# Patient Record
Sex: Female | Born: 1980 | Race: White | Hispanic: No | Marital: Married | Smoking: Current some day smoker
Health system: Southern US, Community
[De-identification: ages and names within clinical notes are randomized; demographics above are authoritative.]

## PROBLEM LIST (undated history)

## (undated) ENCOUNTER — Inpatient Hospital Stay (HOSPITAL_COMMUNITY): Payer: Self-pay

## (undated) DIAGNOSIS — I10 Essential (primary) hypertension: Secondary | ICD-10-CM

## (undated) DIAGNOSIS — R519 Headache, unspecified: Secondary | ICD-10-CM

## (undated) DIAGNOSIS — F32A Depression, unspecified: Secondary | ICD-10-CM

## (undated) DIAGNOSIS — E785 Hyperlipidemia, unspecified: Secondary | ICD-10-CM

## (undated) DIAGNOSIS — E079 Disorder of thyroid, unspecified: Secondary | ICD-10-CM

## (undated) DIAGNOSIS — E039 Hypothyroidism, unspecified: Secondary | ICD-10-CM

## (undated) DIAGNOSIS — F329 Major depressive disorder, single episode, unspecified: Secondary | ICD-10-CM

## (undated) DIAGNOSIS — D649 Anemia, unspecified: Secondary | ICD-10-CM

## (undated) DIAGNOSIS — F419 Anxiety disorder, unspecified: Secondary | ICD-10-CM

## (undated) DIAGNOSIS — O24419 Gestational diabetes mellitus in pregnancy, unspecified control: Secondary | ICD-10-CM

## (undated) DIAGNOSIS — I2699 Other pulmonary embolism without acute cor pulmonale: Secondary | ICD-10-CM

## (undated) HISTORY — DX: Disorder of thyroid, unspecified: E07.9

## (undated) HISTORY — PX: EYE SURGERY: SHX253

## (undated) HISTORY — DX: Hyperlipidemia, unspecified: E78.5

## (undated) HISTORY — DX: Anxiety disorder, unspecified: F41.9

## (undated) HISTORY — PX: WISDOM TOOTH EXTRACTION: SHX21

## (undated) HISTORY — DX: Headache, unspecified: R51.9

## (undated) HISTORY — DX: Hypercalcemia: E83.52

## (undated) HISTORY — DX: Hypothyroidism, unspecified: E03.9

## (undated) HISTORY — DX: Major depressive disorder, single episode, unspecified: F32.9

## (undated) HISTORY — DX: Anemia, unspecified: D64.9

## (undated) HISTORY — DX: Gestational diabetes mellitus in pregnancy, unspecified control: O24.419

## (undated) HISTORY — DX: Depression, unspecified: F32.A

## (undated) HISTORY — DX: Essential (primary) hypertension: I10

---

## 2002-08-03 DIAGNOSIS — I2699 Other pulmonary embolism without acute cor pulmonale: Secondary | ICD-10-CM

## 2002-08-03 HISTORY — DX: Other pulmonary embolism without acute cor pulmonale: I26.99

## 2007-12-20 ENCOUNTER — Other Ambulatory Visit: Admission: RE | Admit: 2007-12-20 | Discharge: 2007-12-20 | Payer: Self-pay | Admitting: Internal Medicine

## 2008-06-14 ENCOUNTER — Ambulatory Visit: Payer: Self-pay | Admitting: Internal Medicine

## 2008-08-15 ENCOUNTER — Ambulatory Visit: Payer: Self-pay | Admitting: Oncology

## 2008-08-21 LAB — CBC WITH DIFFERENTIAL/PLATELET
Basophils Absolute: 0 10*3/uL (ref 0.0–0.1)
EOS%: 1.6 % (ref 0.0–7.0)
HGB: 11.9 g/dL (ref 11.6–15.9)
MCH: 30.6 pg (ref 26.0–34.0)
NEUT#: 7.3 10*3/uL — ABNORMAL HIGH (ref 1.5–6.5)
RBC: 3.9 10*6/uL (ref 3.70–5.32)
RDW: 12.6 % (ref 11.3–14.5)
lymph#: 1.7 10*3/uL (ref 0.9–3.3)

## 2008-08-27 LAB — HYPERCOAGULABLE PANEL, COMPREHENSIVE RET.
Beta-2 Glyco I IgG: <4 U/mL (ref <20)
Beta-2-Glycoprotein I IgM: <4 U/mL (ref <10)
DRVVT 1:1 Mix: 40.5 secs (ref 36.1–47.0)
Homocysteine: 7 umol/L (ref 4.0–15.4)
Protein C Activity: 187 % — ABNORMAL HIGH (ref 75–133)
Protein C, Total: 119 % (ref 70–140)
Protein S Activity: 42 % — ABNORMAL LOW (ref 69–129)
Protein S Ag, Total: 88 % (ref 70–140)

## 2008-08-27 LAB — COMPREHENSIVE METABOLIC PANEL
ALT: 15 U/L (ref 0–35)
AST: 14 U/L (ref 0–37)
Albumin: 4.1 g/dL (ref 3.5–5.2)
BUN: 12 mg/dL (ref 6–23)
Calcium: 11.2 mg/dL — ABNORMAL HIGH (ref 8.4–10.5)
Chloride: 104 mEq/L (ref 96–112)
Potassium: 4.1 mEq/L (ref 3.5–5.3)
Sodium: 135 mEq/L (ref 135–145)
Total Protein: 7.3 g/dL (ref 6.0–8.3)

## 2009-01-09 ENCOUNTER — Encounter: Admission: RE | Admit: 2009-01-09 | Discharge: 2009-01-09 | Payer: Self-pay | Admitting: Obstetrics and Gynecology

## 2009-03-12 ENCOUNTER — Inpatient Hospital Stay (HOSPITAL_COMMUNITY): Admission: AD | Admit: 2009-03-12 | Discharge: 2009-03-14 | Payer: Self-pay | Admitting: Obstetrics and Gynecology

## 2009-09-09 ENCOUNTER — Ambulatory Visit: Payer: Self-pay | Admitting: Internal Medicine

## 2009-09-24 ENCOUNTER — Ambulatory Visit: Payer: Self-pay | Admitting: Internal Medicine

## 2010-03-17 ENCOUNTER — Ambulatory Visit: Payer: Self-pay | Admitting: Internal Medicine

## 2010-04-17 ENCOUNTER — Ambulatory Visit: Payer: Self-pay | Admitting: Internal Medicine

## 2010-05-09 ENCOUNTER — Ambulatory Visit: Payer: Self-pay | Admitting: Internal Medicine

## 2010-06-20 ENCOUNTER — Ambulatory Visit: Payer: Self-pay | Admitting: Internal Medicine

## 2010-10-10 ENCOUNTER — Ambulatory Visit: Payer: Self-pay | Admitting: Internal Medicine

## 2010-10-14 ENCOUNTER — Ambulatory Visit (INDEPENDENT_AMBULATORY_CARE_PROVIDER_SITE_OTHER): Payer: BC Managed Care – PPO | Admitting: Internal Medicine

## 2010-10-14 DIAGNOSIS — I10 Essential (primary) hypertension: Secondary | ICD-10-CM

## 2010-10-14 DIAGNOSIS — E039 Hypothyroidism, unspecified: Secondary | ICD-10-CM

## 2010-10-14 DIAGNOSIS — H6690 Otitis media, unspecified, unspecified ear: Secondary | ICD-10-CM

## 2010-11-08 LAB — CBC
HCT: 29.6 % — ABNORMAL LOW (ref 36.0–46.0)
HCT: 32.1 % — ABNORMAL LOW (ref 36.0–46.0)
HCT: 34.5 % — ABNORMAL LOW (ref 36.0–46.0)
HCT: 35.2 % — ABNORMAL LOW (ref 36.0–46.0)
Hemoglobin: 11 g/dL — ABNORMAL LOW (ref 12.0–15.0)
Hemoglobin: 11.8 g/dL — ABNORMAL LOW (ref 12.0–15.0)
Hemoglobin: 12.2 g/dL (ref 12.0–15.0)
MCHC: 34.2 g/dL (ref 30.0–36.0)
MCHC: 34.4 g/dL (ref 30.0–36.0)
MCHC: 34.5 g/dL (ref 30.0–36.0)
MCV: 90.8 fL (ref 78.0–100.0)
MCV: 91.7 fL (ref 78.0–100.0)
MCV: 91.8 fL (ref 78.0–100.0)
MCV: 91.8 fL (ref 78.0–100.0)
Platelets: 146 10*3/uL — ABNORMAL LOW (ref 150–400)
Platelets: 152 10*3/uL (ref 150–400)
Platelets: 158 10*3/uL (ref 150–400)
RBC: 3.49 MIL/uL — ABNORMAL LOW (ref 3.87–5.11)
RBC: 3.8 MIL/uL — ABNORMAL LOW (ref 3.87–5.11)
RBC: 3.84 MIL/uL — ABNORMAL LOW (ref 3.87–5.11)
RDW: 13.4 % (ref 11.5–15.5)
RDW: 13.5 % (ref 11.5–15.5)
RDW: 13.6 % (ref 11.5–15.5)
WBC: 10.3 10*3/uL (ref 4.0–10.5)
WBC: 11.9 10*3/uL — ABNORMAL HIGH (ref 4.0–10.5)
WBC: 12.2 10*3/uL — ABNORMAL HIGH (ref 4.0–10.5)

## 2010-11-08 LAB — COMPREHENSIVE METABOLIC PANEL
Albumin: 2.1 g/dL — ABNORMAL LOW (ref 3.5–5.2)
Albumin: 2.7 g/dL — ABNORMAL LOW (ref 3.5–5.2)
Alkaline Phosphatase: 114 U/L (ref 39–117)
BUN: 10 mg/dL (ref 6–23)
BUN: 14 mg/dL (ref 6–23)
Chloride: 110 mEq/L (ref 96–112)
Creatinine, Ser: 0.97 mg/dL (ref 0.4–1.2)
Potassium: 4.3 mEq/L (ref 3.5–5.1)
Total Bilirubin: 0.3 mg/dL (ref 0.3–1.2)
Total Protein: 4.7 g/dL — ABNORMAL LOW (ref 6.0–8.3)

## 2010-11-08 LAB — URIC ACID: Uric Acid, Serum: 5.4 mg/dL (ref 2.4–7.0)

## 2010-11-08 LAB — LACTATE DEHYDROGENASE: LDH: 138 U/L (ref 94–250)

## 2010-11-08 LAB — GLUCOSE, CAPILLARY
Glucose-Capillary: 105 mg/dL — ABNORMAL HIGH (ref 70–99)
Glucose-Capillary: 76 mg/dL (ref 70–99)
Glucose-Capillary: 79 mg/dL (ref 70–99)

## 2010-11-08 LAB — SYPHILIS: RPR W/REFLEX TO RPR TITER AND TREPONEMAL ANTIBODIES, TRADITIONAL SCREENING AND DIAGNOSIS ALGORITHM: RPR Ser Ql: NONREACTIVE

## 2010-11-08 LAB — CCBB MATERNAL DONOR DRAW

## 2011-04-15 ENCOUNTER — Encounter: Payer: Self-pay | Admitting: Internal Medicine

## 2011-04-16 ENCOUNTER — Other Ambulatory Visit: Payer: BC Managed Care – PPO | Admitting: Internal Medicine

## 2011-04-16 DIAGNOSIS — Z0000 Encounter for general adult medical examination without abnormal findings: Secondary | ICD-10-CM

## 2011-04-16 LAB — CBC WITH DIFFERENTIAL/PLATELET
Basophils Absolute: 0 10*3/uL (ref 0.0–0.1)
Basophils Relative: 0 % (ref 0–1)
Lymphocytes Relative: 33 % (ref 12–46)
MCHC: 32.2 g/dL (ref 30.0–36.0)
Monocytes Absolute: 0.5 10*3/uL (ref 0.1–1.0)
Neutro Abs: 4.1 10*3/uL (ref 1.7–7.7)
Platelets: 253 10*3/uL (ref 150–400)
RDW: 13 % (ref 11.5–15.5)
WBC: 7.1 10*3/uL (ref 4.0–10.5)

## 2011-04-16 LAB — COMPREHENSIVE METABOLIC PANEL
AST: 16 U/L (ref 0–37)
Albumin: 4.2 g/dL (ref 3.5–5.2)
Alkaline Phosphatase: 64 U/L (ref 39–117)
BUN: 8 mg/dL (ref 6–23)
Creat: 0.92 mg/dL (ref 0.50–1.10)
Potassium: 4.7 mEq/L (ref 3.5–5.3)

## 2011-04-16 LAB — TSH: TSH: 2.671 u[IU]/mL (ref 0.350–4.500)

## 2011-04-16 LAB — LIPID PANEL
Total CHOL/HDL Ratio: 4.6 Ratio
Triglycerides: 159 mg/dL — ABNORMAL HIGH (ref <150)
VLDL: 32 mg/dL (ref 0–40)

## 2011-04-17 ENCOUNTER — Encounter: Payer: Self-pay | Admitting: Internal Medicine

## 2011-04-17 ENCOUNTER — Ambulatory Visit (INDEPENDENT_AMBULATORY_CARE_PROVIDER_SITE_OTHER): Payer: BC Managed Care – PPO | Admitting: Internal Medicine

## 2011-04-17 VITALS — BP 132/80 | HR 80 | Temp 98.9°F | Ht 66.75 in | Wt 208.0 lb

## 2011-04-17 DIAGNOSIS — F419 Anxiety disorder, unspecified: Secondary | ICD-10-CM

## 2011-04-17 DIAGNOSIS — IMO0002 Reserved for concepts with insufficient information to code with codable children: Secondary | ICD-10-CM

## 2011-04-17 DIAGNOSIS — E785 Hyperlipidemia, unspecified: Secondary | ICD-10-CM | POA: Insufficient documentation

## 2011-04-17 DIAGNOSIS — Z0000 Encounter for general adult medical examination without abnormal findings: Secondary | ICD-10-CM

## 2011-04-17 DIAGNOSIS — F411 Generalized anxiety disorder: Secondary | ICD-10-CM

## 2011-04-17 DIAGNOSIS — E669 Obesity, unspecified: Secondary | ICD-10-CM

## 2011-04-17 DIAGNOSIS — E039 Hypothyroidism, unspecified: Secondary | ICD-10-CM

## 2011-04-17 DIAGNOSIS — E559 Vitamin D deficiency, unspecified: Secondary | ICD-10-CM

## 2011-04-17 DIAGNOSIS — F53 Postpartum depression: Secondary | ICD-10-CM

## 2011-04-17 DIAGNOSIS — I10 Essential (primary) hypertension: Secondary | ICD-10-CM

## 2011-04-17 LAB — POCT URINALYSIS DIPSTICK
Protein, UA: NEGATIVE
Spec Grav, UA: 1.015
Urobilinogen, UA: NEGATIVE

## 2011-04-17 LAB — VITAMIN D 25 HYDROXY (VIT D DEFICIENCY, FRACTURES): Vit D, 25-Hydroxy: 33 ng/mL (ref 30–89)

## 2011-04-28 ENCOUNTER — Encounter: Payer: Self-pay | Admitting: Internal Medicine

## 2011-04-28 DIAGNOSIS — E559 Vitamin D deficiency, unspecified: Secondary | ICD-10-CM | POA: Insufficient documentation

## 2011-04-28 DIAGNOSIS — F419 Anxiety disorder, unspecified: Secondary | ICD-10-CM | POA: Insufficient documentation

## 2011-04-28 DIAGNOSIS — O99345 Other mental disorders complicating the puerperium: Secondary | ICD-10-CM | POA: Insufficient documentation

## 2011-04-28 NOTE — Progress Notes (Signed)
  Subjective:    Patient ID: Jennifer Mcmahon, female    DOB: [birth year], 30 y.o.   MRN: [id number]  HPI 30 year old white female with history of postpartum depression, pregnancy-induced hypertension and gestational diabetes. She gained 32 pounds during her pregnancy. When I saw her February 2011 blood pressure was 140/96. History of pulmonary embolism 2006 in the spot oral contraceptives, smoking, immobility and obesity. Was on Coumadin for 9 months. Hematologist did hypercoagulable workup. Factor V Leiden mutation not detected. Prothrombin gene mutation not detected. Normal antithrombin III level. No significant problems with protein C or protein S. Started on HCTZ 25 mg daily February 2011 and discontinued March 2012. History of hypothyroidism hypertension hyperlipidemia and vitamin D deficiency, hypercalcemia with normal parathyroid hormone 62.11 April 2010. Currently on Cozaar 50 mg daily. Started on Wellbutrin in 2011 but this has been discontinued. Takes Klonopin 0.5 mg on a when necessary basis. Take Synthroid 0.1 mg daily.  Family history father with history of arthritis, mother with history of ovarian and cervical cancer, hypertension, obesity, varicose veins. Sister with history of TB exposure. History of bipolar disorder and fleabite as in her grandfather. Paternal grandmother had coronary artery disease.    Review of Systems  Constitutional: Negative.   HENT: Negative.   Eyes: Negative.   Cardiovascular: Negative.   Gastrointestinal: Negative.   Genitourinary: Negative.   Musculoskeletal: Negative.   Neurological: Negative.   Hematological: Negative.   Psychiatric/Behavioral: Negative.        Objective:   Physical Exam  Vitals reviewed. Constitutional: She is oriented to person, place, and time. No distress.  HENT:  Head: Normocephalic and atraumatic.  Right Ear: External ear normal.  Left Ear: External ear normal.  Mouth/Throat: Oropharynx is clear and moist. No  oropharyngeal exudate.  Eyes: EOM are normal. No scleral icterus.  Neck: Neck supple. No JVD present. No thyromegaly present.  Cardiovascular: Normal heart sounds and intact distal pulses.   No murmur heard. Pulmonary/Chest: Effort normal and breath sounds normal. She has no wheezes. She has no rales.  Abdominal: Soft. Bowel sounds are normal. She exhibits no distension and no mass. There is no tenderness. There is no rebound and no guarding.  Genitourinary:       Deferred to GYN  Musculoskeletal: She exhibits no edema.  Lymphadenopathy:    She has no cervical adenopathy.  Neurological: She is alert and oriented to person, place, and time. She has normal reflexes. No cranial nerve deficit. Coordination normal.  Skin: Skin is warm and dry. No rash noted. She is not diaphoretic.  Psychiatric: She has a normal mood and affect.          Assessment & Plan:  Hypertension  Hypothyroidism  Hyperlipidemia  Vitamin D deficiency  Depression (postpartum )  History of hypercalcemia with normal parathyroid hormone level (fasting calcium is 9.8)  Return in 6 months

## 2011-10-16 ENCOUNTER — Other Ambulatory Visit: Payer: BC Managed Care – PPO | Admitting: Internal Medicine

## 2011-10-16 ENCOUNTER — Other Ambulatory Visit: Payer: Self-pay | Admitting: Internal Medicine

## 2011-10-16 DIAGNOSIS — R7301 Impaired fasting glucose: Secondary | ICD-10-CM

## 2011-10-16 DIAGNOSIS — E785 Hyperlipidemia, unspecified: Secondary | ICD-10-CM

## 2011-10-16 DIAGNOSIS — E039 Hypothyroidism, unspecified: Secondary | ICD-10-CM

## 2011-10-16 LAB — LIPID PANEL
HDL: 51 mg/dL (ref >39)
LDL Cholesterol: 161 mg/dL — ABNORMAL HIGH (ref 0–99)
Triglycerides: 95 mg/dL (ref <150)
VLDL: 19 mg/dL (ref 0–40)

## 2011-10-16 LAB — TSH: TSH: 3.922 u[IU]/mL (ref 0.350–4.500)

## 2011-10-19 ENCOUNTER — Ambulatory Visit (INDEPENDENT_AMBULATORY_CARE_PROVIDER_SITE_OTHER): Payer: BC Managed Care – PPO | Admitting: Internal Medicine

## 2011-10-19 ENCOUNTER — Encounter: Payer: Self-pay | Admitting: Internal Medicine

## 2011-10-19 VITALS — BP 124/84 | HR 80 | Temp 97.6°F | Wt 195.0 lb

## 2011-10-19 DIAGNOSIS — M545 Low back pain: Secondary | ICD-10-CM

## 2011-10-19 DIAGNOSIS — I10 Essential (primary) hypertension: Secondary | ICD-10-CM

## 2011-10-19 DIAGNOSIS — F53 Postpartum depression: Secondary | ICD-10-CM

## 2011-10-19 DIAGNOSIS — F419 Anxiety disorder, unspecified: Secondary | ICD-10-CM

## 2011-10-19 DIAGNOSIS — IMO0002 Reserved for concepts with insufficient information to code with codable children: Secondary | ICD-10-CM

## 2011-10-19 DIAGNOSIS — F411 Generalized anxiety disorder: Secondary | ICD-10-CM

## 2011-10-19 DIAGNOSIS — E785 Hyperlipidemia, unspecified: Secondary | ICD-10-CM

## 2011-10-19 DIAGNOSIS — E039 Hypothyroidism, unspecified: Secondary | ICD-10-CM

## 2011-10-19 NOTE — Patient Instructions (Addendum)
Continue anti-inflammatory medication as needed for back pain. If not better in 2-3 weeks consider physical therapy. Return in 6 months for physical exam. Return in 8 weeks for TSH on increased dose of Synthroid 0.112 mg daily.

## 2011-10-19 NOTE — Progress Notes (Signed)
  Subjective:    Patient ID: Jennifer Mcmahon, female    DOB: Mar 02, 1981, 31 y.o.   MRN: [id number]  HPI  patient with history of hypothyroidism, hypertension, hyperlipidemia, vitamin D deficiency, depression and hypercalcemia. Used to take Pristiq but now just has Klonopin on hand to take if needed for anxiety. Has been tired lately. We'll vitamin D level checked. TSH is slightly elevated on Synthroid 0.1 mg daily. Ideally I would like to see TSH of 1. We have been increased Synthroid 0.112 mg daily and recheck 8 weeks. Says she's not really taken any Klonopin in 6 months. She hasn't needed it. Blood pressures under good control on Cozaar. No longer takes HCTZ.  In September 2011 parathyroid hormone level was 62.9 which fell within normal limits. Calcium was 10.4 at that time which was normal. Patient was told to return in 6 months. We last saw her March 2012. At that time TSH was 2.756 and calcium was 10.6. We have added vitamin D level and serum calcium to today's labs. These are pending.  Patient has a new problem today which is low back pain. Says she was sitting on the floor cross leg in and felt acute mid lower back pain. Now having some pain in left buttock. Onset of pain was last week. Tried taking some over-the-counter anti-inflammatory medication. This is helped some. However still having some issues with left buttock pain.    Review of Systems     Objective:   Physical Exam straight leg raising is positive bilaterally at 90. Muscle strength in the lower extremities is normal. Deep tendon reflexes 2+ and symmetrical. Internal and external rotation of the left hip causes pain in the left buttock but not in the hip joint itself. Neck is supple without thyromegaly. Chest clear. Cardiac exam regular rate and rhythm. Extremities without edema.        Assessment & Plan:  Low back pain with spasm  Hypertension  Hyperlipidemia  Hypothyroidism  History of hypercalcemia with normal  parathyroid hormone level  History of anxiety depression (postpartum)  Plan: Serum calcium and vitamin D levels are pending. Increase Synthroid to 0.112 mg daily with recheck TSH in 8 weeks without office visit. Attempt keep TSH at 1.00. Hyperlipidemia is currently diet controlled. However, this needs to be reassessed in 6 months when she returns for physical exam. Continue anti-inflammatory medicine for back pain. Try sleeping with pillow under knees he is sleeping on  back or between knees  if sleeping on  side. If not better in 2-3 weeks we will consider physical therapy.  Addendum: Total cholesterol 231 with LDL cholesterol of 162 which is higher than it was when it was previously checked. Hemoglobin A1c 5.4%. TSH 3.9-2 on Synthroid 0.1 mg daily.

## 2012-04-22 ENCOUNTER — Other Ambulatory Visit: Payer: BC Managed Care – PPO | Admitting: Internal Medicine

## 2012-04-22 DIAGNOSIS — E039 Hypothyroidism, unspecified: Secondary | ICD-10-CM

## 2012-04-22 DIAGNOSIS — Z0000 Encounter for general adult medical examination without abnormal findings: Secondary | ICD-10-CM

## 2012-04-22 DIAGNOSIS — I10 Essential (primary) hypertension: Secondary | ICD-10-CM

## 2012-04-22 LAB — CBC WITH DIFFERENTIAL/PLATELET
Basophils Absolute: 0 10*3/uL (ref 0.0–0.1)
Eosinophils Absolute: 0.2 10*3/uL (ref 0.0–0.7)
Eosinophils Relative: 2 % (ref 0–5)
MCH: 30.2 pg (ref 26.0–34.0)
MCHC: 35 g/dL (ref 30.0–36.0)
MCV: 86.2 fL (ref 78.0–100.0)
Platelets: 251 10*3/uL (ref 150–400)
RDW: 12.9 % (ref 11.5–15.5)

## 2012-04-22 LAB — LIPID PANEL
Cholesterol: 238 mg/dL — ABNORMAL HIGH (ref 0–200)
HDL: 46 mg/dL (ref >39)
Total CHOL/HDL Ratio: 5.2 Ratio
VLDL: 23 mg/dL (ref 0–40)

## 2012-04-22 LAB — COMPREHENSIVE METABOLIC PANEL
ALT: 12 U/L (ref 0–35)
AST: 14 U/L (ref 0–37)
CO2: 25 mEq/L (ref 19–32)
Creat: 0.85 mg/dL (ref 0.50–1.10)
Total Bilirubin: 0.4 mg/dL (ref 0.3–1.2)

## 2012-04-25 ENCOUNTER — Encounter: Payer: Self-pay | Admitting: Internal Medicine

## 2012-04-25 ENCOUNTER — Ambulatory Visit (INDEPENDENT_AMBULATORY_CARE_PROVIDER_SITE_OTHER): Payer: BC Managed Care – PPO | Admitting: Internal Medicine

## 2012-04-25 VITALS — BP 136/100 | HR 80 | Temp 98.0°F | Ht 66.0 in | Wt 194.0 lb

## 2012-04-25 DIAGNOSIS — F32A Depression, unspecified: Secondary | ICD-10-CM

## 2012-04-25 DIAGNOSIS — Z87898 Personal history of other specified conditions: Secondary | ICD-10-CM

## 2012-04-25 DIAGNOSIS — E669 Obesity, unspecified: Secondary | ICD-10-CM

## 2012-04-25 DIAGNOSIS — E785 Hyperlipidemia, unspecified: Secondary | ICD-10-CM

## 2012-04-25 DIAGNOSIS — H6693 Otitis media, unspecified, bilateral: Secondary | ICD-10-CM

## 2012-04-25 DIAGNOSIS — H6690 Otitis media, unspecified, unspecified ear: Secondary | ICD-10-CM

## 2012-04-25 DIAGNOSIS — Z8639 Personal history of other endocrine, nutritional and metabolic disease: Secondary | ICD-10-CM

## 2012-04-25 DIAGNOSIS — F329 Major depressive disorder, single episode, unspecified: Secondary | ICD-10-CM

## 2012-04-25 DIAGNOSIS — E039 Hypothyroidism, unspecified: Secondary | ICD-10-CM

## 2012-04-25 DIAGNOSIS — F419 Anxiety disorder, unspecified: Secondary | ICD-10-CM

## 2012-04-25 DIAGNOSIS — F411 Generalized anxiety disorder: Secondary | ICD-10-CM

## 2012-04-25 DIAGNOSIS — I10 Essential (primary) hypertension: Secondary | ICD-10-CM

## 2012-04-25 DIAGNOSIS — Z0000 Encounter for general adult medical examination without abnormal findings: Secondary | ICD-10-CM

## 2012-04-25 LAB — POCT URINALYSIS DIPSTICK
Bilirubin, UA: NEGATIVE
Blood, UA: NEGATIVE
Glucose, UA: NEGATIVE
Spec Grav, UA: 1.01

## 2012-04-26 NOTE — Progress Notes (Signed)
  Subjective:    Patient ID: Jennifer Mcmahon, female    DOB: [birth year], 31 y.o.   MRN: [id number]  HPI 31 year old white female in today for health maintenance exam and evaluation of medical problems. Says she has not been compliant with her medications recently because of financial stress. Husband has had to have insulin therapy which apparently was not covered by insurance. They've had some unexpected  bills which has caused some financial strain.      Review of Systems  Constitutional: Positive for fatigue.  HENT: Negative.   Eyes: Negative.   Respiratory: Negative.   Cardiovascular: Negative.   Gastrointestinal: Negative.   Genitourinary: Negative.   Musculoskeletal: Negative.   Neurological: Negative.   Hematological: Negative.        Objective:   Physical Exam  Vitals reviewed. Constitutional: She is oriented to person, place, and time. She appears well-developed and well-nourished.  HENT:  Head: Normocephalic and atraumatic.  Mouth/Throat: Oropharynx is clear and moist. No oropharyngeal exudate.       TMs are full and pink bilaterally  Eyes: Conjunctivae normal and EOM are normal. Pupils are equal, round, and reactive to light. Right eye exhibits no discharge. Left eye exhibits no discharge. No scleral icterus.  Neck: Normal range of motion. Neck supple. No JVD present. No thyromegaly present.  Cardiovascular: Normal rate, regular rhythm, normal heart sounds and intact distal pulses.   No murmur heard. Pulmonary/Chest: Effort normal and breath sounds normal. No respiratory distress. She has no wheezes. She has no rales.       Breasts normal female  Abdominal: Soft. Bowel sounds are normal. She exhibits no distension and no mass. There is no tenderness. There is no rebound and no guarding.  Musculoskeletal: Normal range of motion. She exhibits no edema.  Lymphadenopathy:    She has no cervical adenopathy.  Neurological: She is alert and oriented to person, place, and  time. She has normal reflexes. No cranial nerve deficit.  Skin: Skin is warm and dry. No rash noted. She is not diaphoretic.  Psychiatric: She has a normal mood and affect. Her behavior is normal. Judgment and thought content normal.          Assessment & Plan:  History of hypercalcemia-being monitored  History of postpartum depression  Hypothyroidism-restart Synthroid  Hyperlipidemia-restart Zocor 10 mg daily at supper  Health maintenance: To get influenza immunization at work  Hypertension-patient says blood pressure has been normal off of losartan HCTZ and she was to continue to monitor it although it is elevated today  Bilateral otitis media  History of vitamin D deficiency  Obesity  Anxiety  Plan: Patient is to return in 6 months and maybe financial situation will be better. For otitis media: Zithromax Z-Pak take 2 tabs day one followed by 1 tablet 2 through 5.

## 2012-05-02 ENCOUNTER — Encounter: Payer: Self-pay | Admitting: Internal Medicine

## 2012-05-03 NOTE — Patient Instructions (Addendum)
Restart Synthroid, Zocor, take Zithromax for otitis media. Get influenza immunization at work. Return in 6 months for office visit, lipid panel, serum calcium, TSH. Please take better care of yourself and take her medications. Monitor your blood pressure off losartan HCTZ. If it remains elevated he needs to recontact me.

## 2012-10-25 ENCOUNTER — Other Ambulatory Visit: Payer: BC Managed Care – PPO | Admitting: Internal Medicine

## 2012-10-25 DIAGNOSIS — E785 Hyperlipidemia, unspecified: Secondary | ICD-10-CM

## 2012-10-25 DIAGNOSIS — E039 Hypothyroidism, unspecified: Secondary | ICD-10-CM

## 2012-10-25 DIAGNOSIS — Z79899 Other long term (current) drug therapy: Secondary | ICD-10-CM

## 2012-10-25 LAB — HEPATIC FUNCTION PANEL
Albumin: 4.4 g/dL (ref 3.5–5.2)
Total Protein: 7.3 g/dL (ref 6.0–8.3)

## 2012-10-25 LAB — LIPID PANEL
HDL: 49 mg/dL (ref >39)
LDL Cholesterol: 137 mg/dL — ABNORMAL HIGH (ref 0–99)
Total CHOL/HDL Ratio: 4.3 Ratio
Triglycerides: 115 mg/dL (ref <150)
VLDL: 23 mg/dL (ref 0–40)

## 2012-10-25 LAB — CALCIUM: Calcium: 10.3 mg/dL (ref 8.4–10.5)

## 2012-10-26 LAB — TSH: TSH: 4.613 u[IU]/mL — ABNORMAL HIGH (ref 0.350–4.500)

## 2012-10-27 ENCOUNTER — Ambulatory Visit: Payer: BC Managed Care – PPO | Admitting: Internal Medicine

## 2012-10-31 ENCOUNTER — Ambulatory Visit (INDEPENDENT_AMBULATORY_CARE_PROVIDER_SITE_OTHER): Payer: BC Managed Care – PPO | Admitting: Internal Medicine

## 2012-10-31 ENCOUNTER — Encounter: Payer: Self-pay | Admitting: Internal Medicine

## 2012-10-31 VITALS — BP 134/84 | HR 80 | Temp 98.2°F | Wt 200.5 lb

## 2012-10-31 DIAGNOSIS — H6503 Acute serous otitis media, bilateral: Secondary | ICD-10-CM

## 2012-10-31 DIAGNOSIS — H66009 Acute suppurative otitis media without spontaneous rupture of ear drum, unspecified ear: Secondary | ICD-10-CM

## 2012-10-31 MED ORDER — CLONAZEPAM 0.5 MG PO TABS: 0.5000 mg | ORAL_TABLET | Freq: Two times a day (BID) | ORAL | Status: DC | PRN

## 2012-10-31 NOTE — Progress Notes (Signed)
  Subjective:    Patient ID: Jennifer Mcmahon, female    DOB: 16-Mar-1981, 32 y.o.   MRN: [id number]  HPI 32 year old white female with history of hypertension, hyperlipidemia, obesity, hypothyroidism, mild hypercalcemia, postpartum depression and vitamin D deficiency. She's in today for six-month recheck. Unfortunately has not been compliant with thyroid replacement therapy and TSH reflects that. This was an issue at her previous visit as well. At that time she was having some financial hardship a 40 medications but that apparently has gotten better with some job stability. Still she has issues taking thyroid replacement medication. She is currently not on antihypertensive medication. Rocephin watching her blood pressure but used to be on HCTZ and losartan. She is on Zocor 10 mg daily. Is supposed to be taking Synthroid 0.1 mg daily. History of anxiety and PMS. Think she may need to go see GYN regarding heavy periods and PMS symptoms. Asking for refill on Klonopin for anxiety and PMS symptoms. Complaining of some ear congestion. With regard to hypercalcemia, it has simply been followed and levels have been stable at slightly over 10 range. She understands this could possibly be a parathyroid adenoma.    Review of Systems     Objective:   Physical Exam Skin is warm and dry. Neck is supple without thyromegaly. HEENT exam: Left TM dull and chronically scarred. Right TM full with good light reflex. Pharynx is clear. Neck without adenopathy. Chest clear to auscultation. Cardiac exam regular rate and rhythm normal S1 and S2. Extremities without edema.        Assessment & Plan:  Hypertension-currently just watching blood pressure off medication  Hyperlipidemia-on Zocor 10 mg daily in stable and improved  Obesity to-not motivated to diet and exercise  Hypothyroidism-TSH clearly abnormal. Has missed at least half of her thyroid medicine over the past month. Explained to her I could not titrate it well in  the she was compliant with medication. We had a discussion about noncompliance.  History of hypercalcemia-stable and being want  Anxiety  History of PMS  Menorrhagia  Acute serous otitis media-patient does not want to be treated for this today  Plan: Patient encouraged to see GYN regarding menorrhagia and PMS symptoms. May benefit from an SSRI. Regarding anxiety refill Klonopin 0.5 mg (#60) 1 by mouth twice a day when necessary anxiety with no refill. Followup with physical exam early October 2014.

## 2012-10-31 NOTE — Patient Instructions (Addendum)
Patient declined antibiotic for ear condition. Continue same medications and return in 6 months. Please be compliant with thyroid replacement therapy and Zocor. Use Klonopin sparingly for anxiety.

## 2013-04-11 ENCOUNTER — Other Ambulatory Visit: Payer: Self-pay | Admitting: Internal Medicine

## 2013-05-05 ENCOUNTER — Other Ambulatory Visit: Payer: Self-pay | Admitting: Internal Medicine

## 2013-05-05 ENCOUNTER — Other Ambulatory Visit: Payer: BC Managed Care – PPO | Admitting: Internal Medicine

## 2013-05-05 DIAGNOSIS — Z79899 Other long term (current) drug therapy: Secondary | ICD-10-CM

## 2013-05-05 DIAGNOSIS — Z130 Encounter for screening for diseases of the blood and blood-forming organs and certain disorders involving the immune mechanism: Secondary | ICD-10-CM

## 2013-05-05 DIAGNOSIS — E039 Hypothyroidism, unspecified: Secondary | ICD-10-CM

## 2013-05-05 DIAGNOSIS — E785 Hyperlipidemia, unspecified: Secondary | ICD-10-CM

## 2013-05-05 DIAGNOSIS — Z0000 Encounter for general adult medical examination without abnormal findings: Secondary | ICD-10-CM

## 2013-05-05 LAB — COMPREHENSIVE METABOLIC PANEL
Albumin: 4.4 g/dL (ref 3.5–5.2)
BUN: 8 mg/dL (ref 6–23)
CO2: 24 mEq/L (ref 19–32)
Calcium: 9.9 mg/dL (ref 8.4–10.5)
Chloride: 108 mEq/L (ref 96–112)
Creat: 0.86 mg/dL (ref 0.50–1.10)
Glucose, Bld: 106 mg/dL — ABNORMAL HIGH (ref 70–99)
Potassium: 4.6 mEq/L (ref 3.5–5.3)

## 2013-05-05 LAB — LIPID PANEL
Cholesterol: 220 mg/dL — ABNORMAL HIGH (ref 0–200)
HDL: 49 mg/dL (ref >39)
Total CHOL/HDL Ratio: 4.5 Ratio
Triglycerides: 136 mg/dL (ref <150)

## 2013-05-05 LAB — CBC WITH DIFFERENTIAL/PLATELET
Eosinophils Relative: 2 % (ref 0–5)
HCT: 41.4 % (ref 36.0–46.0)
Hemoglobin: 14.3 g/dL (ref 12.0–15.0)
Lymphocytes Relative: 38 % (ref 12–46)
MCV: 87.9 fL (ref 78.0–100.0)
Monocytes Absolute: 0.5 10*3/uL (ref 0.1–1.0)
Monocytes Relative: 9 % (ref 3–12)
Neutro Abs: 3.2 10*3/uL (ref 1.7–7.7)
WBC: 6.2 10*3/uL (ref 4.0–10.5)

## 2013-05-05 LAB — TSH: TSH: 0.568 u[IU]/mL (ref 0.350–4.500)

## 2013-05-08 ENCOUNTER — Encounter: Payer: Self-pay | Admitting: Internal Medicine

## 2013-05-08 ENCOUNTER — Ambulatory Visit (INDEPENDENT_AMBULATORY_CARE_PROVIDER_SITE_OTHER): Payer: BC Managed Care – PPO | Admitting: Internal Medicine

## 2013-05-08 VITALS — BP 130/94 | Temp 98.8°F | Ht 66.0 in | Wt 197.0 lb

## 2013-05-08 DIAGNOSIS — I10 Essential (primary) hypertension: Secondary | ICD-10-CM

## 2013-05-08 DIAGNOSIS — H6690 Otitis media, unspecified, unspecified ear: Secondary | ICD-10-CM

## 2013-05-08 DIAGNOSIS — E039 Hypothyroidism, unspecified: Secondary | ICD-10-CM

## 2013-05-08 DIAGNOSIS — H60399 Other infective otitis externa, unspecified ear: Secondary | ICD-10-CM

## 2013-05-08 DIAGNOSIS — H6093 Unspecified otitis externa, bilateral: Secondary | ICD-10-CM

## 2013-05-08 DIAGNOSIS — E8881 Metabolic syndrome: Secondary | ICD-10-CM

## 2013-05-08 DIAGNOSIS — Z23 Encounter for immunization: Secondary | ICD-10-CM

## 2013-05-08 DIAGNOSIS — H6692 Otitis media, unspecified, left ear: Secondary | ICD-10-CM

## 2013-05-08 DIAGNOSIS — E785 Hyperlipidemia, unspecified: Secondary | ICD-10-CM

## 2013-05-08 DIAGNOSIS — E669 Obesity, unspecified: Secondary | ICD-10-CM

## 2013-05-08 DIAGNOSIS — Z0000 Encounter for general adult medical examination without abnormal findings: Secondary | ICD-10-CM

## 2013-05-08 DIAGNOSIS — F411 Generalized anxiety disorder: Secondary | ICD-10-CM

## 2013-05-08 LAB — HEMOGLOBIN A1C
Hgb A1c MFr Bld: 5.6 % (ref <5.7)
Mean Plasma Glucose: 114 mg/dL (ref <117)

## 2013-05-08 LAB — POCT URINALYSIS DIPSTICK
Bilirubin, UA: NEGATIVE
Glucose, UA: NEGATIVE
Ketones, UA: NEGATIVE
Spec Grav, UA: 1.015
Urobilinogen, UA: NEGATIVE

## 2013-05-08 MED ORDER — SIMVASTATIN 10 MG PO TABS: 10.0000 mg | ORAL_TABLET | Freq: Every day | ORAL | Status: DC

## 2013-05-08 MED ORDER — LOSARTAN POTASSIUM 50 MG PO TABS: 50.0000 mg | ORAL_TABLET | Freq: Every day | ORAL | Status: DC

## 2013-05-08 MED ORDER — LEVOTHYROXINE SODIUM 100 MCG PO TABS: ORAL_TABLET | ORAL | Status: DC

## 2013-05-08 NOTE — Patient Instructions (Addendum)
We will make appointment for you to see ENT physician regarding ear issues. Restart Cozaar 50 mg daily for hypertension. Restart statin medication for hyperlipidemia. Return in 8 weeks. TSH is within normal limits on current dose of Synthroid.

## 2013-05-08 NOTE — Progress Notes (Signed)
Subjective:    Patient ID: Jennifer Mcmahon, female    DOB: 1981/05/09, 32 y.o.   MRN: [id number]  HPI   Patient in today for health maintenance and evaluation of medical issues. She has a history of hypertension but came off of losartan HCTZ in 2013. Low pressure she says was normal for some period of time off that medication. She has a history of hyperlipidemia, hypocalcemia which we are watching, hypothyroidism, postpartum depression and vitamin D deficiency. She is complaining of bilateral ear itching. Her ears feel full and congested. She has had this for some time. From time to time we have treated her with antibiotics which she says helps temporarily and then the problem recurs.  Plans to have GYN exam at [facility]. Is thinking about another pregnancy. If so, would need to transition off ARB for conceiving. Patient is taking thyroid replacement therapy but stopped taking Zocor 10 mg daily which was prescribed previously for hyperlipidemia. History of anxiety for which she takes Klonopin sparingly.  Social history: Patient is married. Husband is Event organiser in sociology and may continue his education to obtain a PhD degree in sociology. They have a daughter age 66 who is in preschool. 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 with [employer].  Patient agrees to take influenza immunization today. Also needs tetanus immunization update which will be given today.    Review of Systems  Constitutional: Negative.   HENT:       Bilateral ear itching and pain and congestion  Eyes: Negative.   Respiratory: Negative.   Cardiovascular: Negative.   Gastrointestinal: Negative.   Endocrine: Negative.   Genitourinary: Negative.   Allergic/Immunologic: Negative.   Neurological: Negative.   Hematological: Negative.   Psychiatric/Behavioral:       History of anxiety treated with Klonopin       Objective:   Physical Exam  Vitals reviewed. Constitutional: She is  oriented to person, place, and time. She appears well-developed and well-nourished. No distress.  Overweight female in no acute distress  HENT:  Head: Normocephalic and atraumatic.  Mouth/Throat: Oropharynx is clear and moist. No oropharyngeal exudate.  Left TM has a greenish yellow appearance with some central erythema and appears to be infected. Left external ear canal is red. Right TM is full but not red. Very little irritation of right external ear canal  Eyes: Conjunctivae are normal. Pupils are equal, round, and reactive to light. Right eye exhibits no discharge. Left eye exhibits no discharge. No scleral icterus.  Neck: Neck supple. No JVD present. No thyromegaly present.  Cardiovascular: Normal rate, regular rhythm, normal heart sounds and intact distal pulses.   No murmur heard. Pulmonary/Chest: Effort normal and breath sounds normal. No respiratory distress. She has no wheezes. She has no rales. She exhibits no tenderness.  Breasts normal female  Abdominal: Soft. Bowel sounds are normal. She exhibits no distension and no mass. There is no tenderness. There is no rebound and no guarding.  Genitourinary:  Deferred to GYN  Musculoskeletal: She exhibits no edema.  Lymphadenopathy:    She has no cervical adenopathy.  Neurological: She is alert and oriented to person, place, and time. She has normal reflexes. No cranial nerve deficit. Coordination normal.  Skin: Skin is warm and dry. No rash noted. She is not diaphoretic.  Psychiatric: She has a normal mood and affect. Her behavior is normal. Judgment and thought content normal.          Assessment & Plan:  Hypertension-patient has blood pressure elevation 130/94. Start Cozaar 50 mg daily. Previously was on losartan HCTZ and 2013. She discontinued it because she said her blood pressure normalized.  Hyperlipidemia-restart statin medication and recheck in 8 weeks.  Hypothyroidism-TSH within normal limits. Continue Synthroid 0.1 mg  daily  History of anxiety for which she takes Klonopin sparingly  Hypercalcemia-calcium is within normal limits. We will continue to follow this.  History of vitamin D deficiency. Vitamin D level is normal today.  Hyperglycemia-hemoglobin A1c ordered because of glucose of 106 on fasting lab work  Obesity  Metabolic syndrome  Chronic left otitis media. Bilateral otitis externa. Patient may see ENT physician for further evaluation.  Plan: Return in 8 weeks for b-met, blood pressure check, lipid panel,  liver functions. Flu vaccine and tetanus immunization given today. Start losartan 50 mg daily. Restart Zocor 10 mg daily. Continue Synthroid 0.1 mg daily

## 2013-05-09 LAB — HEMOGLOBIN A1C

## 2013-06-20 ENCOUNTER — Other Ambulatory Visit: Payer: BC Managed Care – PPO | Admitting: Internal Medicine

## 2013-06-22 ENCOUNTER — Ambulatory Visit: Payer: BC Managed Care – PPO | Admitting: Internal Medicine

## 2013-08-03 NOTE — L&D Delivery Note (Signed)
Delivery Note At 7:09 PM a viable and healthy female was delivered via Vaginal, Spontaneous Delivery (Presentation: Right Occiput Anterior).  APGAR8, 9; weight  pending .   Placenta status: Intact, Spontaneous.  Not sent Cord: 3 vessels with the following complications: Long.  Cord pH: none  Anesthesia: Epidural  Episiotomy: None Lacerations: None Suture Repair: n/a Est. Blood Loss (mL): 250  Mom to postpartum.  Baby to Couplet care / Skin to Skin.  Keighley Deckman A 06/17/2014, 7:26 PM

## 2013-11-15 LAB — OB RESULTS CONSOLE ANTIBODY SCREEN: Antibody Screen: NEGATIVE

## 2013-11-15 LAB — OB RESULTS CONSOLE ABO/RH: RH TYPE: POSITIVE

## 2013-11-15 LAB — OB RESULTS CONSOLE HEPATITIS B SURFACE ANTIGEN: Hepatitis B Surface Ag: NEGATIVE

## 2013-11-15 LAB — OB RESULTS CONSOLE HIV ANTIBODY (ROUTINE TESTING): HIV: NONREACTIVE

## 2013-11-15 LAB — OB RESULTS CONSOLE RPR: RPR: NONREACTIVE

## 2013-11-15 LAB — OB RESULTS CONSOLE RUBELLA ANTIBODY, IGM: Rubella: IMMUNE

## 2013-11-30 LAB — OB RESULTS CONSOLE GC/CHLAMYDIA
CHLAMYDIA, DNA PROBE: NEGATIVE
GC PROBE AMP, GENITAL: NEGATIVE

## 2014-01-10 ENCOUNTER — Encounter: Payer: BC Managed Care – PPO | Attending: Obstetrics and Gynecology

## 2014-01-10 VITALS — Ht 67.0 in | Wt 197.1 lb

## 2014-01-10 DIAGNOSIS — Z713 Dietary counseling and surveillance: Secondary | ICD-10-CM | POA: Insufficient documentation

## 2014-01-10 DIAGNOSIS — O99810 Abnormal glucose complicating pregnancy: Secondary | ICD-10-CM | POA: Insufficient documentation

## 2014-01-10 NOTE — Progress Notes (Signed)
  Patient was seen on 01/10/14 for Gestational Diabetes self-management class at the [facility]. The following learning objectives were met by the patient during this course:   States the definition of Gestational Diabetes  States why dietary management is important in controlling blood glucose  Describes the effects of carbohydrates on blood glucose levels  Demonstrates ability to create a balanced meal plan  Demonstrates carbohydrate counting   States when to check blood glucose levels  Demonstrates proper blood glucose monitoring techniques  States the effect of stress and exercise on blood glucose levels  States the importance of limiting caffeine and abstaining from alcohol and smoking  Plan:  Aim for 2 Carb Choices per meal (30 grams) +/- 1 either way for breakfast Aim for 3 Carb Choices per meal (45 grams) +/- 1 either way from lunch and dinner Aim for 1-2 Carbs per snack Begin reading food labels for Total Carbohydrate and sugar grams of foods Consider  increasing your activity level by walking daily as tolerated Begin checking BG before breakfast and 2 hours after first bit of breakfast, lunch and dinner after  as directed by MD  Take medication  as directed by MD  Blood glucose monitor given: One Touch Ultra 2 Lot # [id number] X Exp: 12/2014 Blood glucose reading: $RemoveBeforeDE'93mg'gNtQFGfwFKjNumU$ /dl  Patient instructed to monitor glucose levels: FBS: 60 - <90 2 hour: <120  Patient received the following handouts:  Nutrition Diabetes and Pregnancy  Carbohydrate Counting List  Meal Planning worksheet  Patient will be seen for follow-up as needed.

## 2014-05-16 ENCOUNTER — Telehealth: Payer: Self-pay

## 2014-05-16 NOTE — Telephone Encounter (Signed)
Not seen since Oct 2014. Would need OV to get thyroid med refilled. No CPE appt until December.

## 2014-05-16 NOTE — Telephone Encounter (Signed)
Patient called requesting synthroid refill.  [facility] but she would like to get back in with us.  Advised her she would need an appointment.  Please advise.

## 2014-05-17 NOTE — Telephone Encounter (Signed)
Left message for patient to call office and schedule an appointment for TSH draw and follow up for medication.

## 2014-05-24 ENCOUNTER — Other Ambulatory Visit: Payer: BC Managed Care – PPO | Admitting: Internal Medicine

## 2014-05-24 DIAGNOSIS — E038 Other specified hypothyroidism: Secondary | ICD-10-CM

## 2014-05-24 LAB — TSH: TSH: 2.771 u[IU]/mL (ref 0.350–4.500)

## 2014-05-25 ENCOUNTER — Inpatient Hospital Stay (HOSPITAL_COMMUNITY)
Admission: AD | Admit: 2014-05-25 | Discharge: 2014-05-25 | Disposition: A | Discharge status: Home / Self Care | Payer: BC Managed Care – PPO | Source: Ambulatory Visit | Attending: Obstetrics | Admitting: Obstetrics

## 2014-05-25 ENCOUNTER — Telehealth: Payer: Self-pay | Admitting: Internal Medicine

## 2014-05-25 ENCOUNTER — Ambulatory Visit: Payer: Self-pay | Admitting: Internal Medicine

## 2014-05-25 ENCOUNTER — Encounter (HOSPITAL_COMMUNITY): Payer: Self-pay | Admitting: *Deleted

## 2014-05-25 DIAGNOSIS — Z3A35 35 weeks gestation of pregnancy: Secondary | ICD-10-CM | POA: Diagnosis not present

## 2014-05-25 DIAGNOSIS — E039 Hypothyroidism, unspecified: Secondary | ICD-10-CM | POA: Diagnosis not present

## 2014-05-25 DIAGNOSIS — O99283 Endocrine, nutritional and metabolic diseases complicating pregnancy, third trimester: Secondary | ICD-10-CM | POA: Diagnosis not present

## 2014-05-25 DIAGNOSIS — O133 Gestational [pregnancy-induced] hypertension without significant proteinuria, third trimester: Secondary | ICD-10-CM | POA: Insufficient documentation

## 2014-05-25 LAB — COMPREHENSIVE METABOLIC PANEL
ALT: 10 U/L (ref 0–35)
AST: 15 U/L (ref 0–37)
Albumin: 2.8 g/dL — ABNORMAL LOW (ref 3.5–5.2)
Alkaline Phosphatase: 94 U/L (ref 39–117)
Anion gap: 10 (ref 5–15)
BUN: 11 mg/dL (ref 6–23)
CO2: 20 mEq/L (ref 19–32)
Calcium: 10.3 mg/dL (ref 8.4–10.5)
Chloride: 105 mEq/L (ref 96–112)
Creatinine, Ser: 0.73 mg/dL (ref 0.50–1.10)
GFR calc Af Amer: >90 mL/min (ref >90)
GFR calc non Af Amer: >90 mL/min (ref >90)
Glucose, Bld: 94 mg/dL (ref 70–99)
Potassium: 4.3 mEq/L (ref 3.7–5.3)
Sodium: 135 mEq/L — ABNORMAL LOW (ref 137–147)
Total Bilirubin: <0.2 mg/dL — ABNORMAL LOW (ref 0.3–1.2)
Total Protein: 7.1 g/dL (ref 6.0–8.3)

## 2014-05-25 LAB — PROTEIN / CREATININE RATIO, URINE
Creatinine, Urine: 78.36 mg/dL
Protein Creatinine Ratio: 0.1 (ref 0.00–0.15)
Total Protein, Urine: 7.9 mg/dL

## 2014-05-25 LAB — CBC
HCT: 35 % — ABNORMAL LOW (ref 36.0–46.0)
Hemoglobin: 11.8 g/dL — ABNORMAL LOW (ref 12.0–15.0)
MCH: 30.3 pg (ref 26.0–34.0)
MCHC: 33.7 g/dL (ref 30.0–36.0)
MCV: 89.7 fL (ref 78.0–100.0)
Platelets: 182 10*3/uL (ref 150–400)
RBC: 3.9 MIL/uL (ref 3.87–5.11)
RDW: 13.1 % (ref 11.5–15.5)
WBC: 11.7 10*3/uL — ABNORMAL HIGH (ref 4.0–10.5)

## 2014-05-25 LAB — URIC ACID: Uric Acid, Serum: 5.4 mg/dL (ref 2.4–7.0)

## 2014-05-25 LAB — OB RESULTS CONSOLE GBS: GBS: NEGATIVE

## 2014-05-25 MED ORDER — LABETALOL HCL 100 MG PO TABS
100.0000 mg | ORAL_TABLET | Freq: Two times a day (BID) | ORAL | Status: DC
Start: 2014-05-25 — End: 2014-08-30

## 2014-05-25 NOTE — MAU Note (Signed)
New problem today with BP elevation.  Has had BP problems in the past. Denies HA, visual changes, increase in edema or epigastric pain

## 2014-05-25 NOTE — MAU Provider Note (Signed)
  History     CSN: [id number]  Arrival date and time: 05/25/14 1107 Call from nurse @1325  with test results Provider in to see patient with instructions for discharge @ 1420    Chief Complaint  Patient presents with  . Hypertension   HPI  Sent from office for HYPERTENSION  Past Medical History  Diagnosis Date  . Thyroid disease     hypothyroidism  . Hypertension   . Hyperlipidemia   . Depression   . Hypercalcemia   . Vitamin D deficiency     History reviewed. No pertinent past surgical history.  Family History  Problem Relation Age of Onset  . Cancer Mother     uterine and ovarian  . Hypertension Mother     History  Substance Use Topics  . Smoking status: Former Smoker    Types: Cigarettes    Quit date: 02/25/2008  . Smokeless tobacco: Never Used  . Alcohol Use: Yes     Comment: social    Allergies:  Allergies  Allergen Reactions  . Ramipril Cough    Prescriptions prior to admission  Medication Sig Dispense Refill  . aspirin 81 MG tablet Take 81 mg by mouth daily.      . calcium carbonate (TUMS - DOSED IN MG ELEMENTAL CALCIUM) 500 MG chewable tablet Chew 1 tablet by mouth daily.      . iron polysaccharides (NU-IRON) 150 MG capsule Take 150 mg by mouth 2 (two) times daily.      Marland Kitchen. levothyroxine (SYNTHROID, LEVOTHROID) 100 MCG tablet TAKE 1 TABLET BY MOUTH ONCE DAILY  30 tablet  5  . Prenatal Multivit-Min-Fe-FA (PRENATAL VITAMINS PO) Take 1 each by mouth.        ROS Physical Exam   Blood pressure 151/92, pulse 90, temperature 98.1 F (36.7 C), temperature source Oral, resp. rate 18, height 5' 4.5" (1.638 m), weight 95.255 kg (210 lb).  Physical Exam Alert and oriented Abdomen soft and non-tender Pelvic - deferred Extremities - trace edema  PIH labs normal NST - reactive  MAU Course  Procedures  NST - reactive  Assessment and Plan  35 weeks hypertension - gestational No evidence of PEC Hypothyroidism - out of meds  / pending call back  from endocrinology regarding RX  consult with DR Wonda Oldsousin - recommend Labetalol 100 BID - OV Monday for recheck  DC home  Marlinda MikeBAILEY, Othell Jaime 05/25/2014, 2:27 PM

## 2014-05-25 NOTE — Telephone Encounter (Signed)
Patient had an appointment this a.m. For f/u TSH.  Husband states that patient is being admitted to the hospital by her OB/GYN.

## 2014-05-28 ENCOUNTER — Telehealth: Payer: Self-pay

## 2014-05-28 MED ORDER — LEVOTHYROXINE SODIUM 100 MCG PO TABS: ORAL_TABLET | ORAL | Status: DC

## 2014-05-28 NOTE — Telephone Encounter (Signed)
[facility] called to find out what the plan is for patients thyroid medication.  Patient is [redacted] weeks pregnant.  Per Dr Lenord FellersBaxley thyroid medication has been refilled for 90 days.  Patient needs to be seen before she runs out of medication again.  [facility] will have patient call and schedule appointment.

## 2014-06-04 ENCOUNTER — Encounter (HOSPITAL_COMMUNITY): Payer: Self-pay | Admitting: *Deleted

## 2014-06-12 ENCOUNTER — Other Ambulatory Visit: Payer: Self-pay | Admitting: Obstetrics and Gynecology

## 2014-06-13 ENCOUNTER — Encounter (HOSPITAL_COMMUNITY)
Admission: RE | Admit: 2014-06-13 | Discharge: 2014-06-13 | Disposition: A | Discharge status: Home / Self Care | Payer: BC Managed Care – PPO | Source: Ambulatory Visit | Attending: Obstetrics and Gynecology | Admitting: Obstetrics and Gynecology

## 2014-06-14 ENCOUNTER — Telehealth (HOSPITAL_COMMUNITY): Payer: Self-pay | Admitting: *Deleted

## 2014-06-14 ENCOUNTER — Encounter (HOSPITAL_COMMUNITY): Payer: Self-pay | Admitting: *Deleted

## 2014-06-14 NOTE — Telephone Encounter (Signed)
Preadmission screen  

## 2014-06-17 ENCOUNTER — Inpatient Hospital Stay (HOSPITAL_COMMUNITY)
Admission: AD | Admit: 2014-06-17 | Discharge: 2014-06-19 | DRG: 774 | Disposition: A | Discharge status: Home / Self Care | Payer: BC Managed Care – PPO | Source: Ambulatory Visit | Attending: Obstetrics and Gynecology | Admitting: Obstetrics and Gynecology

## 2014-06-17 ENCOUNTER — Encounter (HOSPITAL_COMMUNITY): Payer: Self-pay | Admitting: *Deleted

## 2014-06-17 ENCOUNTER — Inpatient Hospital Stay (HOSPITAL_COMMUNITY): Payer: BC Managed Care – PPO | Admitting: Anesthesiology

## 2014-06-17 ENCOUNTER — Inpatient Hospital Stay (HOSPITAL_COMMUNITY): Admission: RE | Admit: 2014-06-17 | Payer: BC Managed Care – PPO | Source: Ambulatory Visit

## 2014-06-17 DIAGNOSIS — O133 Gestational [pregnancy-induced] hypertension without significant proteinuria, third trimester: Secondary | ICD-10-CM | POA: Diagnosis present

## 2014-06-17 DIAGNOSIS — Z7982 Long term (current) use of aspirin: Secondary | ICD-10-CM | POA: Diagnosis not present

## 2014-06-17 DIAGNOSIS — O403XX0 Polyhydramnios, third trimester, not applicable or unspecified: Secondary | ICD-10-CM | POA: Diagnosis present

## 2014-06-17 DIAGNOSIS — D649 Anemia, unspecified: Secondary | ICD-10-CM | POA: Diagnosis present

## 2014-06-17 DIAGNOSIS — O9902 Anemia complicating childbirth: Secondary | ICD-10-CM | POA: Diagnosis present

## 2014-06-17 DIAGNOSIS — Z7901 Long term (current) use of anticoagulants: Secondary | ICD-10-CM | POA: Diagnosis not present

## 2014-06-17 DIAGNOSIS — Z86711 Personal history of pulmonary embolism: Secondary | ICD-10-CM | POA: Diagnosis not present

## 2014-06-17 DIAGNOSIS — E039 Hypothyroidism, unspecified: Secondary | ICD-10-CM | POA: Diagnosis present

## 2014-06-17 DIAGNOSIS — F419 Anxiety disorder, unspecified: Secondary | ICD-10-CM | POA: Diagnosis present

## 2014-06-17 DIAGNOSIS — O10013 Pre-existing essential hypertension complicating pregnancy, third trimester: Secondary | ICD-10-CM | POA: Diagnosis present

## 2014-06-17 DIAGNOSIS — Z8249 Family history of ischemic heart disease and other diseases of the circulatory system: Secondary | ICD-10-CM | POA: Diagnosis not present

## 2014-06-17 DIAGNOSIS — O24420 Gestational diabetes mellitus in childbirth, diet controlled: Secondary | ICD-10-CM | POA: Diagnosis present

## 2014-06-17 DIAGNOSIS — O1002 Pre-existing essential hypertension complicating childbirth: Secondary | ICD-10-CM | POA: Diagnosis present

## 2014-06-17 DIAGNOSIS — E785 Hyperlipidemia, unspecified: Secondary | ICD-10-CM | POA: Diagnosis present

## 2014-06-17 DIAGNOSIS — Z79899 Other long term (current) drug therapy: Secondary | ICD-10-CM

## 2014-06-17 DIAGNOSIS — O99284 Endocrine, nutritional and metabolic diseases complicating childbirth: Secondary | ICD-10-CM | POA: Diagnosis present

## 2014-06-17 DIAGNOSIS — O403XX1 Polyhydramnios, third trimester, fetus 1: Secondary | ICD-10-CM

## 2014-06-17 DIAGNOSIS — Z3A39 39 weeks gestation of pregnancy: Secondary | ICD-10-CM | POA: Diagnosis present

## 2014-06-17 DIAGNOSIS — O24410 Gestational diabetes mellitus in pregnancy, diet controlled: Secondary | ICD-10-CM

## 2014-06-17 DIAGNOSIS — E559 Vitamin D deficiency, unspecified: Secondary | ICD-10-CM | POA: Diagnosis present

## 2014-06-17 DIAGNOSIS — O99283 Endocrine, nutritional and metabolic diseases complicating pregnancy, third trimester: Secondary | ICD-10-CM

## 2014-06-17 DIAGNOSIS — O09523 Supervision of elderly multigravida, third trimester: Secondary | ICD-10-CM

## 2014-06-17 DIAGNOSIS — O99344 Other mental disorders complicating childbirth: Secondary | ICD-10-CM | POA: Diagnosis present

## 2014-06-17 DIAGNOSIS — Z87891 Personal history of nicotine dependence: Secondary | ICD-10-CM | POA: Diagnosis not present

## 2014-06-17 DIAGNOSIS — F329 Major depressive disorder, single episode, unspecified: Secondary | ICD-10-CM | POA: Diagnosis present

## 2014-06-17 DIAGNOSIS — O1495 Unspecified pre-eclampsia, complicating the puerperium: Secondary | ICD-10-CM | POA: Diagnosis present

## 2014-06-17 HISTORY — DX: Other pulmonary embolism without acute cor pulmonale: I26.99

## 2014-06-17 LAB — CBC
HCT: 33 % — ABNORMAL LOW (ref 36.0–46.0)
HEMATOCRIT: 35.2 % — AB (ref 36.0–46.0)
Hemoglobin: 12.2 g/dL (ref 12.0–15.0)
Hemoglobin: 11.4 g/dL — ABNORMAL LOW (ref 12.0–15.0)
MCH: 30.9 pg (ref 26.0–34.0)
MCH: 31 pg (ref 26.0–34.0)
MCHC: 34.7 g/dL (ref 30.0–36.0)
MCHC: 34.5 g/dL (ref 30.0–36.0)
MCV: 89.1 fL (ref 78.0–100.0)
MCV: 89.7 fL (ref 78.0–100.0)
PLATELETS: 177 10*3/uL (ref 150–400)
Platelets: 205 10*3/uL (ref 150–400)
RBC: 3.95 MIL/uL (ref 3.87–5.11)
RBC: 3.68 MIL/uL — ABNORMAL LOW (ref 3.87–5.11)
RDW: 13.3 % (ref 11.5–15.5)
RDW: 13.3 % (ref 11.5–15.5)
WBC: 11.7 10*3/uL — ABNORMAL HIGH (ref 4.0–10.5)
WBC: 9.3 10*3/uL (ref 4.0–10.5)

## 2014-06-17 LAB — CBC WITH DIFFERENTIAL/PLATELET
Basophils Absolute: 0 10*3/uL (ref 0.0–0.1)
Basophils Relative: 0 % (ref 0–1)
Eosinophils Absolute: 0.1 10*3/uL (ref 0.0–0.7)
Eosinophils Relative: 0 % (ref 0–5)
HCT: 34.1 % — ABNORMAL LOW (ref 36.0–46.0)
HEMOGLOBIN: 11.4 g/dL — AB (ref 12.0–15.0)
LYMPHS ABS: 2 10*3/uL (ref 0.7–4.0)
LYMPHS PCT: 12 % (ref 12–46)
MCH: 29.9 pg (ref 26.0–34.0)
MCHC: 33.4 g/dL (ref 30.0–36.0)
MCV: 89.5 fL (ref 78.0–100.0)
MONOS PCT: 7 % (ref 3–12)
Monocytes Absolute: 1.1 10*3/uL — ABNORMAL HIGH (ref 0.1–1.0)
NEUTROS ABS: 13.7 10*3/uL — AB (ref 1.7–7.7)
Neutrophils Relative %: 81 % — ABNORMAL HIGH (ref 43–77)
Platelets: 192 10*3/uL (ref 150–400)
RBC: 3.81 MIL/uL — AB (ref 3.87–5.11)
RDW: 13.4 % (ref 11.5–15.5)
WBC: 16.9 10*3/uL — AB (ref 4.0–10.5)

## 2014-06-17 LAB — COMPREHENSIVE METABOLIC PANEL
ALT: 11 U/L (ref 0–35)
ANION GAP: 12 (ref 5–15)
AST: 13 U/L (ref 0–37)
Albumin: 2.7 g/dL — ABNORMAL LOW (ref 3.5–5.2)
Alkaline Phosphatase: 112 U/L (ref 39–117)
BUN: 14 mg/dL (ref 6–23)
CHLORIDE: 105 meq/L (ref 96–112)
CO2: 17 meq/L — AB (ref 19–32)
CREATININE: 0.71 mg/dL (ref 0.50–1.10)
Calcium: 10.1 mg/dL (ref 8.4–10.5)
GFR calc Af Amer: >90 mL/min (ref >90)
Glucose, Bld: 107 mg/dL — ABNORMAL HIGH (ref 70–99)
Potassium: 4.4 mEq/L (ref 3.7–5.3)
Sodium: 134 mEq/L — ABNORMAL LOW (ref 137–147)
Total Protein: 6.3 g/dL (ref 6.0–8.3)

## 2014-06-17 LAB — GLUCOSE, CAPILLARY
GLUCOSE-CAPILLARY: 153 mg/dL — AB (ref 70–99)
Glucose-Capillary: 152 mg/dL — ABNORMAL HIGH (ref 70–99)
Glucose-Capillary: 71 mg/dL (ref 70–99)
Glucose-Capillary: 80 mg/dL (ref 70–99)

## 2014-06-17 LAB — ABO/RH: ABO/RH(D): A POS

## 2014-06-17 LAB — TYPE AND SCREEN
ABO/RH(D): A POS
Antibody Screen: NEGATIVE

## 2014-06-17 LAB — RPR

## 2014-06-17 LAB — URIC ACID: Uric Acid, Serum: 5.8 mg/dL (ref 2.4–7.0)

## 2014-06-17 MED ORDER — ONDANSETRON HCL 4 MG/2ML IJ SOLN
4.0000 mg | Freq: Four times a day (QID) | INTRAMUSCULAR | Status: DC | PRN
  Filled 2014-06-17: qty 2

## 2014-06-17 MED ORDER — OXYCODONE-ACETAMINOPHEN 5-325 MG PO TABS: 1.0000 | ORAL_TABLET | ORAL | Status: DC | PRN

## 2014-06-17 MED ORDER — ENOXAPARIN SODIUM 40 MG/0.4ML ~~LOC~~ SOLN: 40.0000 mg | SUBCUTANEOUS | Status: DC

## 2014-06-17 MED ORDER — FENTANYL 2.5 MCG/ML BUPIVACAINE 1/10 % EPIDURAL INFUSION (WH - ANES)
14.0000 mL/h | INTRAMUSCULAR | Status: DC | PRN
  Administered 2014-06-17: 14 mL/h via EPIDURAL

## 2014-06-17 MED ORDER — OXYTOCIN 40 UNITS IN LACTATED RINGERS INFUSION - SIMPLE MED: 62.5000 mL/h | INTRAVENOUS | Status: AC

## 2014-06-17 MED ORDER — LOPERAMIDE HCL 2 MG PO CAPS
2.0000 mg | ORAL_CAPSULE | ORAL | Status: DC | PRN
  Filled 2014-06-17: qty 1

## 2014-06-17 MED ORDER — EPHEDRINE 5 MG/ML INJ
10.0000 mg | INTRAVENOUS | Status: DC | PRN
  Filled 2014-06-17: qty 2

## 2014-06-17 MED ORDER — BENZOCAINE-MENTHOL 20-0.5 % EX AERO: 1.0000 "application " | INHALATION_SPRAY | CUTANEOUS | Status: DC | PRN

## 2014-06-17 MED ORDER — PHENYLEPHRINE 40 MCG/ML (10ML) SYRINGE FOR IV PUSH (FOR BLOOD PRESSURE SUPPORT)
80.0000 ug | PREFILLED_SYRINGE | INTRAVENOUS | Status: DC | PRN
  Filled 2014-06-17: qty 2

## 2014-06-17 MED ORDER — LEVOTHYROXINE SODIUM 100 MCG PO TABS
100.0000 ug | ORAL_TABLET | Freq: Every day | ORAL | Status: DC
  Administered 2014-06-18 – 2014-06-19 (×2): 100 ug via ORAL
  Filled 2014-06-17 (×4): qty 1

## 2014-06-17 MED ORDER — TERBUTALINE SULFATE 1 MG/ML IJ SOLN: 0.2500 mg | Freq: Once | INTRAMUSCULAR | Status: DC | PRN

## 2014-06-17 MED ORDER — DIPHENHYDRAMINE HCL 25 MG PO CAPS: 25.0000 mg | ORAL_CAPSULE | Freq: Four times a day (QID) | ORAL | Status: DC | PRN

## 2014-06-17 MED ORDER — DIBUCAINE 1 % RE OINT: 1.0000 "application " | TOPICAL_OINTMENT | RECTAL | Status: DC | PRN

## 2014-06-17 MED ORDER — NALBUPHINE HCL 10 MG/ML IJ SOLN: 5.0000 mg | INTRAMUSCULAR | Status: DC | PRN

## 2014-06-17 MED ORDER — PHENYLEPHRINE 40 MCG/ML (10ML) SYRINGE FOR IV PUSH (FOR BLOOD PRESSURE SUPPORT)
PREFILLED_SYRINGE | INTRAVENOUS | Status: AC
  Filled 2014-06-17: qty 10

## 2014-06-17 MED ORDER — OXYTOCIN 40 UNITS IN LACTATED RINGERS INFUSION - SIMPLE MED
1.0000 m[IU]/min | INTRAVENOUS | Status: DC
  Administered 2014-06-17: 8 m[IU]/min via INTRAVENOUS
  Administered 2014-06-17: 10 m[IU]/min via INTRAVENOUS
  Administered 2014-06-17: 2 m[IU]/min via INTRAVENOUS
  Administered 2014-06-17: 4 m[IU]/min via INTRAVENOUS
  Administered 2014-06-17: 6 m[IU]/min via INTRAVENOUS
  Filled 2014-06-17 (×2): qty 1000

## 2014-06-17 MED ORDER — CITRIC ACID-SODIUM CITRATE 334-500 MG/5ML PO SOLN
30.0000 mL | ORAL | Status: DC | PRN
  Administered 2014-06-17: 30 mL via ORAL
  Filled 2014-06-17: qty 15

## 2014-06-17 MED ORDER — LABETALOL HCL 200 MG PO TABS: 200.0000 mg | ORAL_TABLET | Freq: Two times a day (BID) | ORAL | Status: DC

## 2014-06-17 MED ORDER — OXYTOCIN 10 UNIT/ML IJ SOLN: 10.0000 [IU] | Freq: Once | INTRAMUSCULAR | Status: DC

## 2014-06-17 MED ORDER — LEVOTHYROXINE SODIUM 100 MCG PO TABS
100.0000 ug | ORAL_TABLET | Freq: Every day | ORAL | Status: DC
  Filled 2014-06-17 (×2): qty 1

## 2014-06-17 MED ORDER — LACTATED RINGERS IV SOLN: 500.0000 mL | Freq: Once | INTRAVENOUS | Status: DC

## 2014-06-17 MED ORDER — IBUPROFEN 600 MG PO TABS
600.0000 mg | ORAL_TABLET | Freq: Four times a day (QID) | ORAL | Status: DC
  Administered 2014-06-17 – 2014-06-19 (×7): 600 mg via ORAL
  Filled 2014-06-17 (×7): qty 1

## 2014-06-17 MED ORDER — LIDOCAINE HCL (PF) 1 % IJ SOLN
30.0000 mL | INTRAMUSCULAR | Status: DC | PRN
  Filled 2014-06-17: qty 30

## 2014-06-17 MED ORDER — SIMETHICONE 80 MG PO CHEW
80.0000 mg | CHEWABLE_TABLET | ORAL | Status: DC | PRN
Start: 2014-06-17 — End: 2014-06-19

## 2014-06-17 MED ORDER — CARBOPROST TROMETHAMINE 250 MCG/ML IM SOLN: 250.0000 ug | Freq: Once | INTRAMUSCULAR | Status: DC

## 2014-06-17 MED ORDER — SENNOSIDES-DOCUSATE SODIUM 8.6-50 MG PO TABS
2.0000 | ORAL_TABLET | ORAL | Status: DC
  Administered 2014-06-17: 2 via ORAL
  Filled 2014-06-17: qty 2

## 2014-06-17 MED ORDER — OXYCODONE-ACETAMINOPHEN 5-325 MG PO TABS: 2.0000 | ORAL_TABLET | ORAL | Status: DC | PRN

## 2014-06-17 MED ORDER — BUPIVACAINE HCL (PF) 0.25 % IJ SOLN
INTRAMUSCULAR | Status: DC | PRN
  Administered 2014-06-17 (×2): 5 mL

## 2014-06-17 MED ORDER — DIPHENHYDRAMINE HCL 50 MG/ML IJ SOLN: 12.5000 mg | INTRAMUSCULAR | Status: DC | PRN

## 2014-06-17 MED ORDER — OXYTOCIN 40 UNITS IN LACTATED RINGERS INFUSION - SIMPLE MED
62.5000 mL/h | INTRAVENOUS | Status: DC
  Filled 2014-06-17: qty 1000

## 2014-06-17 MED ORDER — OXYCODONE-ACETAMINOPHEN 5-325 MG PO TABS
1.0000 | ORAL_TABLET | ORAL | Status: DC | PRN
  Administered 2014-06-18: 1 via ORAL
  Filled 2014-06-17: qty 1

## 2014-06-17 MED ORDER — ZOLPIDEM TARTRATE 5 MG PO TABS: 5.0000 mg | ORAL_TABLET | Freq: Every evening | ORAL | Status: DC | PRN

## 2014-06-17 MED ORDER — LACTATED RINGERS IV SOLN: INTRAVENOUS | Status: DC

## 2014-06-17 MED ORDER — LACTATED RINGERS IV SOLN
INTRAVENOUS | Status: DC
  Administered 2014-06-17: 1000 mL via INTRAUTERINE

## 2014-06-17 MED ORDER — PHENYLEPHRINE 40 MCG/ML (10ML) SYRINGE FOR IV PUSH (FOR BLOOD PRESSURE SUPPORT)
80.0000 ug | PREFILLED_SYRINGE | INTRAVENOUS | Status: DC | PRN
Start: 2014-06-17 — End: 2014-06-17
  Filled 2014-06-17: qty 2

## 2014-06-17 MED ORDER — LACTATED RINGERS IV SOLN
INTRAVENOUS | Status: DC
  Administered 2014-06-17 (×2): via INTRAVENOUS

## 2014-06-17 MED ORDER — LANOLIN HYDROUS EX OINT: TOPICAL_OINTMENT | CUTANEOUS | Status: DC | PRN

## 2014-06-17 MED ORDER — ACETAMINOPHEN 325 MG PO TABS: 650.0000 mg | ORAL_TABLET | ORAL | Status: DC | PRN

## 2014-06-17 MED ORDER — ENOXAPARIN SODIUM 40 MG/0.4ML ~~LOC~~ SOLN
40.0000 mg | SUBCUTANEOUS | Status: DC
Start: 2014-06-18 — End: 2014-06-19
  Administered 2014-06-18 – 2014-06-19 (×2): 40 mg via SUBCUTANEOUS
  Filled 2014-06-17 (×3): qty 0.4

## 2014-06-17 MED ORDER — WITCH HAZEL-GLYCERIN EX PADS: 1.0000 "application " | MEDICATED_PAD | CUTANEOUS | Status: DC | PRN

## 2014-06-17 MED ORDER — LACTATED RINGERS IV SOLN
500.0000 mL | INTRAVENOUS | Status: DC | PRN
  Administered 2014-06-17: 500 mL via INTRAVENOUS

## 2014-06-17 MED ORDER — LABETALOL HCL 200 MG PO TABS
200.0000 mg | ORAL_TABLET | Freq: Two times a day (BID) | ORAL | Status: DC
  Administered 2014-06-17 – 2014-06-19 (×4): 200 mg via ORAL
  Filled 2014-06-17 (×4): qty 1

## 2014-06-17 MED ORDER — FENTANYL 2.5 MCG/ML BUPIVACAINE 1/10 % EPIDURAL INFUSION (WH - ANES)
INTRAMUSCULAR | Status: DC | PRN
Start: 2014-06-17 — End: 2014-06-17
  Administered 2014-06-17: 14 mL/h via EPIDURAL

## 2014-06-17 MED ORDER — LABETALOL HCL 100 MG PO TABS
100.0000 mg | ORAL_TABLET | Freq: Two times a day (BID) | ORAL | Status: DC
  Administered 2014-06-17: 100 mg via ORAL
  Filled 2014-06-17 (×3): qty 1

## 2014-06-17 MED ORDER — POLYSACCHARIDE IRON COMPLEX 150 MG PO CAPS
150.0000 mg | ORAL_CAPSULE | Freq: Two times a day (BID) | ORAL | Status: DC
Start: 2014-06-18 — End: 2014-06-18
  Administered 2014-06-18: 150 mg via ORAL
  Filled 2014-06-17: qty 1

## 2014-06-17 MED ORDER — ONDANSETRON HCL 4 MG/2ML IJ SOLN: 4.0000 mg | INTRAMUSCULAR | Status: DC | PRN

## 2014-06-17 MED ORDER — ONDANSETRON HCL 4 MG PO TABS: 4.0000 mg | ORAL_TABLET | ORAL | Status: DC | PRN

## 2014-06-17 MED ORDER — PRENATAL MULTIVITAMIN CH
1.0000 | ORAL_TABLET | Freq: Every day | ORAL | Status: DC
  Administered 2014-06-18 – 2014-06-19 (×2): 1 via ORAL
  Filled 2014-06-17 (×2): qty 1

## 2014-06-17 MED ORDER — FENTANYL 2.5 MCG/ML BUPIVACAINE 1/10 % EPIDURAL INFUSION (WH - ANES)
INTRAMUSCULAR | Status: AC
  Filled 2014-06-17: qty 125

## 2014-06-17 MED ORDER — OXYTOCIN BOLUS FROM INFUSION
500.0000 mL | INTRAVENOUS | Status: DC
  Administered 2014-06-17: 500 mL via INTRAVENOUS

## 2014-06-17 NOTE — Plan of Care (Signed)
Problem: Phase I Progression Outcomes Goal: Medical plan of care initiated within 2 hrs of admission Outcome: Completed/Met Date Met:  06/17/14

## 2014-06-17 NOTE — Progress Notes (Signed)
Fluid return from amnio infusion

## 2014-06-17 NOTE — Plan of Care (Signed)
Problem: Phase I Progression Outcomes Goal: Assess per MD/Nurse,Routine-VS,FHR,UC,Head to Toe assess Outcome: Completed/Met Date Met:  06/17/14     

## 2014-06-17 NOTE — Progress Notes (Signed)
S: requesting Epidural  O:  Pitocin BP  135/92 VE: per RN, 6/90/-2 in left exaggerated sims Small clot passed  Tracing; baseline 150 (+) intermittent variable decel down to 120 lasting 30-40 sec Ctx q 2 mins  plt 177   IMP: variable decel due to cord compression Gest HTN on labetolol Class A1 GDM Hypothyroidism on med Polyhydramnios P) Epidural. Start amnioinfusion

## 2014-06-17 NOTE — Progress Notes (Signed)
S: epidural.  Still with pain. Getting re-dosed  O:  BP 154/97. ( 182/111) Pitocin VE unchanged Amnioinfusion on going Tracing: baseline 140 (+) variables( intermittent) Ctx q 2-4 mins(couplet)  IMP: arrest of dilation due to prob posterior asynclitic presentation Class a1 GDM Polyhydramnios Gest HTN on labetalol P) cont amnioinfusion. To maternal right side. Foley catheter. Cont pitocin

## 2014-06-17 NOTE — Progress Notes (Signed)
Called to review tracing ON arrival. Rn notified me pt was now fully (+) 2 station Tracing noted; deep variable decels with ctx Ctx q 2 mins Pitocin off  IMP: Complete Variable decels due to cord compression and fetal descent P) start pushing

## 2014-06-17 NOTE — Anesthesia Procedure Notes (Signed)
Epidural Patient location during procedure: OB Start time: 06/17/2014 4:30 PM End time: 06/17/2014 4:34 PM  Staffing Anesthesiologist: Leilani AbleHATCHETT, Braxdon Gappa  Preanesthetic Checklist Completed: patient identified, surgical consent, pre-op evaluation, timeout performed, IV checked, risks and benefits discussed and monitors and equipment checked  Epidural Patient position: sitting Prep: site prepped and draped and DuraPrep Patient monitoring: continuous pulse ox and blood pressure Approach: midline Location: L3-L4 Injection technique: LOR air  Needle:  Needle type: Tuohy  Needle gauge: 17 G Needle length: 9 cm and 9 Needle insertion depth: 6 cm Catheter type: closed end flexible Catheter size: 19 Gauge Catheter at skin depth: 11 cm Test dose: negative and Other  Assessment Sensory level: T9 Events: blood not aspirated, injection not painful, no injection resistance, negative IV test and no paresthesia  Additional Notes Reason for block:procedure for pain

## 2014-06-17 NOTE — H&P (Signed)
Jennifer Mcmahon is a 33 y.o. female presenting for IOL @ [redacted] weeks gestation due to gestational HTN on labetalol, polyhydramnios, Class A1 GDM  Diet controlled, Hypothyroidiism on med. Hx PE requiring lovenox  Maternal Medical History:  Contractions: Frequency: irregular.    Fetal activity: Perceived fetal activity is normal.    Prenatal complications: PIH and polyhydramnios.   hypothyroidism  Prenatal Complications - Diabetes: gestational. Diabetes is managed by diet.      OB History    Gravida Para Term Preterm AB TAB SAB Ectopic Multiple Living   2 1 1       1      Past Medical History  Diagnosis Date  . Thyroid disease     hypothyroidism  . Hyperlipidemia   . Depression   . Hypercalcemia   . Vitamin D deficiency   . Anxiety   . Anemia   . Hypothyroidism   . Gestational diabetes     diet controlled  . Hypertension     labetolol  . PE (pulmonary embolism) 2004   Past Surgical History  Procedure Laterality Date  . Eye surgery      lasik  . Wisdom tooth extraction     Family History: family history includes Bipolar disorder in her paternal grandfather; Cancer in her mother; Heart disease in her maternal grandmother; Hypertension in her mother; Thyroid disease in her mother; Tuberculosis in her sister; Varicose Veins in her maternal grandfather and mother. Social History:  reports that she quit smoking about 6 years ago. Her smoking use included Cigarettes. She smoked 0.00 packs per day. She has never used smokeless tobacco. She reports that she drinks alcohol. She reports that she does not use illicit drugs.   Prenatal Transfer Tool  Maternal Diabetes: Yes:  Diabetes Type:  Diet controlled Genetic Screening: Normal Maternal Ultrasounds/Referrals: Normal Fetal Ultrasounds or other Referrals:  None Maternal Substance Abuse:  No Significant Maternal Medications:  Meds include: Syntroid Other: labetalol,  Significant Maternal Lab Results:  Lab values include: Group B  Strep negative Other Comments:  hypothyroidism on med, Gest HTN on labetalol, polyhydramnios, gest DM diet controlled.  Review of Systems  All other systems reviewed and are negative.   Dilation: 4 Effacement (%): 90 Station: -2 Exam by:: Dr Cherly Hensenousins Blood pressure 135/92, pulse 83, temperature 98 F (36.7 C), temperature source Oral, resp. rate 18, height 5\' 7"  (1.702 m), weight 96.616 kg (213 lb). Maternal Exam:  Abdomen: Patient reports no abdominal tenderness. Estimated fetal weight is 7 lb.   Fetal presentation: vertex  Introitus: Normal vulva. Ferning test: not done.  Nitrazine test: not done.  Pelvis: adequate for delivery.      Physical Exam  Constitutional: She is oriented to person, place, and time. She appears well-developed and well-nourished.  HENT:  Head: Atraumatic.  Eyes: EOM are normal.  Neck: Neck supple.  Cardiovascular: Regular rhythm.   Respiratory: Breath sounds normal.  GI: Soft.  Musculoskeletal: She exhibits no edema.  Neurological: She is alert and oriented to person, place, and time. She has normal reflexes.  Skin: Skin is warm and dry.  Psychiatric: She has a normal mood and affect.    Prenatal labs: ABO, Rh: --/--/A POS (11/15 40980815) Antibody: NEG (11/15 0815) Rubella: Immune (04/15 0000) RPR: Nonreactive (04/15 0000)  HBsAg: Negative (04/15 0000)  HIV: Non-reactive (04/15 0000)  GBS: Negative (10/23 0000)   Assessment/Plan: Gestational HTN on labetalol Polyhydramnios Class A1 GDM  Hypothyroidism Term gestation Hx PE P) admit . Cont meds(  synthroid, labetalol). Start lovenox pp as done with prior preg. PIH labs. Analgesic prn. Controlled amniotomy. Pitocin. BS check q 2 hrs  Samari Gorby A 06/17/2014, 3:34 PM

## 2014-06-17 NOTE — Progress Notes (Signed)
S: notes ctx some painful  O: BP 136/85  VE 2cm to  4/90/-2 applied after AROM AROM copious clear fluid  IUPC, ISE placed  Tracing: baseline 140 (+) small accel occ variable Ctx q 2-3 mins BS 156 after jello CBC Latest Ref Rng 06/17/2014 06/17/2014 05/25/2014  WBC 4.0 - 10.5 K/uL 9.3 11.7(H) 11.7(H)  Hemoglobin 12.0 - 15.0 g/dL 11.4(L) 12.2 11.8(L)  Hematocrit 36.0 - 46.0 % 33.0(L) 35.2(L) 35.0(L)  Platelets 150 - 400 K/uL 177 205 182     IMP: Latent/Active phase Gest HTN on labetalol Polyhydramnios Class a1 GDM Hypothyroidism P) watch FH closely. Poss need for amnioinfusion. Left exaggerated sims position. Epidural prn. Cont pitocin

## 2014-06-17 NOTE — Progress Notes (Signed)
Dr Cherly Hensenousins informed of CBG. No new orders at this time

## 2014-06-17 NOTE — Plan of Care (Signed)
Problem: Phase I Progression Outcomes Goal: OOB as tolerated unless otherwise ordered Outcome: Completed/Met Date Met:  06/17/14     

## 2014-06-17 NOTE — Progress Notes (Signed)
May administer Hemabate if patients bleeding increases per Dr. Seymour BarsLavoie.   Rosebud PolesGloria Zenita Kister RN

## 2014-06-17 NOTE — Plan of Care (Signed)
Problem: Phase I Progression Outcomes Goal: Medications/IV Fluids N/A Outcome: Completed/Met Date Met:  06/17/14

## 2014-06-17 NOTE — Plan of Care (Signed)
Problem: Phase II Progression Outcomes Goal: Fetal monitoring per orders Outcome: Completed/Met Date Met:  06/17/14     

## 2014-06-17 NOTE — Anesthesia Preprocedure Evaluation (Signed)
Anesthesia Evaluation  Patient identified by MRN, date of birth, ID band Patient awake    Reviewed: Allergy & Precautions, H&P , NPO status , Patient's Chart, lab work & pertinent test results  Airway Mallampati: I  TM Distance: >3 FB Neck ROM: full    Dental no notable dental hx.    Pulmonary former smoker,    Pulmonary exam normal       Cardiovascular hypertension, Pt. on home beta blockers     Neuro/Psych negative neurological ROS     GI/Hepatic negative GI ROS, Neg liver ROS,   Endo/Other  diabetesHypothyroidism   Renal/GU negative Renal ROS     Musculoskeletal   Abdominal Normal abdominal exam  (+)   Peds  Hematology   Anesthesia Other Findings   Reproductive/Obstetrics (+) Pregnancy                             Anesthesia Physical Anesthesia Plan  ASA: III  Anesthesia Plan: Epidural   Post-op Pain Management:    Induction:   Airway Management Planned:   Additional Equipment:   Intra-op Plan:   Post-operative Plan:   Informed Consent: I have reviewed the patients History and Physical, chart, labs and discussed the procedure including the risks, benefits and alternatives for the proposed anesthesia with the patient or authorized representative who has indicated his/her understanding and acceptance.     Plan Discussed with:   Anesthesia Plan Comments:         Anesthesia Quick Evaluation

## 2014-06-17 NOTE — Progress Notes (Signed)
3 cm clot in toilet

## 2014-06-17 NOTE — Plan of Care (Signed)
Problem: Phase II Progression Outcomes Goal: Regular diet Outcome: Completed/Met Date Met:  06/17/14 Goal: Perineum clean and intact Outcome: Completed/Met Date Met:  06/17/14 Goal: Assess lochia color, amount, odor, clots, clots size Outcome: Completed/Met Date Met:  06/17/14 Goal: Initial bonding Outcome: Completed/Met Date Met:  06/17/14 Goal: Skin to skin contact initiated Outcome: Completed/Met Date Met:  06/17/14 Goal: Initiate breastfeeding within 1hr delivery Outcome: Completed/Met Date Met:  06/17/14 Goal: Other Phase II Outcomes/Goals Outcome: Not Applicable Date Met:  66/29/47

## 2014-06-17 NOTE — Plan of Care (Signed)
Problem: Phase I Progression Outcomes Goal: Obtain and review prenatal records Outcome: Completed/Met Date Met:  06/17/14

## 2014-06-17 NOTE — Plan of Care (Signed)
Problem: Phase I Progression Outcomes Goal: Tolerating diet Outcome: Completed/Met Date Met:  06/17/14     

## 2014-06-18 ENCOUNTER — Encounter (HOSPITAL_COMMUNITY): Payer: Self-pay | Admitting: Obstetrics and Gynecology

## 2014-06-18 LAB — CBC
HEMATOCRIT: 32.9 % — AB (ref 36.0–46.0)
Hemoglobin: 11.2 g/dL — ABNORMAL LOW (ref 12.0–15.0)
MCH: 30.4 pg (ref 26.0–34.0)
MCHC: 34 g/dL (ref 30.0–36.0)
MCV: 89.4 fL (ref 78.0–100.0)
PLATELETS: 174 10*3/uL (ref 150–400)
RBC: 3.68 MIL/uL — ABNORMAL LOW (ref 3.87–5.11)
RDW: 13.2 % (ref 11.5–15.5)
WBC: 12.4 10*3/uL — AB (ref 4.0–10.5)

## 2014-06-18 NOTE — Plan of Care (Signed)
Problem: Consults Goal: Postpartum Patient Education (See Patient Education module for education specifics.) Outcome: Completed/Met Date Met:  06/18/14  Problem: Phase I Progression Outcomes Goal: Pain controlled with appropriate interventions Outcome: Completed/Met Date Met:  06/18/14 Goal: Voiding adequately Outcome: Completed/Met Date Met:  06/18/14 Goal: OOB as tolerated unless otherwise ordered Outcome: Completed/Met Date Met:  06/18/14 Goal: VS, stable, temp < 100.4 degrees F Outcome: Completed/Met Date Met:  06/18/14 Goal: Initial discharge plan identified Outcome: Completed/Met Date Met:  06/18/14  Problem: Discharge Progression Outcomes Goal: Remove staples per MD order Outcome: Not Applicable Date Met:  94/80/16

## 2014-06-18 NOTE — Lactation Note (Signed)
This note was copied from the chart of Boy Lenice Pressmaneresa Abdon. Lactation Consultation Note  Patient Name: Boy Lenice Pressmaneresa Rhyne UJWJX'BToday's Date: 06/18/2014 Reason for consult: Initial assessment of this mom and baby, now 27 hours postpartum. Mom has a 35 yo child and states she had initial BF difficulty but was able to nurse for 4-5 months.  She would like to rent an electric breast pump at discharge tomorrow. LATCH scores of 9 and 10 have been assessed by RN staff and mom denies any breastfeeding concerns except that her milk has not yet "come in".  LC encouraged frequent STS and cue feedings.   LC encouraged review of Baby and Me pp 9, 14 and 20-25 for STS and BF information. LC provided [employer] Resource brochure and reviewed [facility] and list of community and web site resources.    Maternal Data Formula Feeding for Exclusion: No Has patient been taught Hand Expression?: Yes (experienced breastfeeding mom) Does the patient have breastfeeding experience prior to this delivery?: Yes  Feeding    LATCH Score/Interventions           LATCH scores=9 and 10 per RN assessment           Lactation Tools Discussed/Used   STS, cue feedings, hand expression  Consult Status Consult Status: Follow-up Date: 06/19/14 Follow-up type: In-patient    Warrick ParisianBryant, Othel Dicostanzo [facility] 06/18/2014, 10:13 PM

## 2014-06-18 NOTE — Progress Notes (Addendum)
PPD #1- SVD  Subjective:   Reports feeling well Tolerating po/ No nausea or vomiting Bleeding is light No HA, visual disturbances, or epigastric pain Pain controlled with Motrin Up ad lib / ambulatory / voiding without problems Newborn: breastfeeding  / Circumcision: planning   Objective:   VS:  VS:  Filed Vitals:   06/17/14 2305 06/18/14 0316 06/18/14 0603 06/18/14 0956  BP: 155/88 131/79 141/87 130/81  Pulse: 96 83 79 81  Temp: 98.2 F (36.8 C) 97.8 F (36.6 C) 97.7 F (36.5 C) 98 F (36.7 C)  TempSrc: Oral Oral Oral Oral  Resp: 20 16 18 18   Height:      Weight:      SpO2:    98%    LABS:  Recent Labs  06/17/14 2105 06/18/14 0600  WBC 16.9* 12.4*  HGB 11.4* 11.2*  PLT 192 174   Blood type: --/--/A POS, A POS (11/15 0815) Rubella: Immune (04/15 0000)   I&O: Intake/Output      11/15 0701 - 11/16 0700 11/16 0701 - 11/17 0700   Urine (mL/kg/hr) 400    Blood 600    Total Output 1000     Net -1000          Urine Occurrence 1 x      Physical Exam: Alert and oriented x3 Abdomen: soft, non-tender, non-distended  Fundus: firm, non-tender, U-2 Perineum: intact Lochia: small Extremities: No edema, no calf pain or tenderness    Assessment:  PPD #1 G2P2002/ S/P:induced vaginal  Gestational HTN, delivered A1GDM, delivered Hypothyroidism DVT history  Doing well    Plan: Continue Lovenox pp Continue Labetalol-BP improving Continue routine post partum orders Anticipate D/C home tomorrow   Donette LarryBHAMBRI, Willistine Ferrall, N MSN, CNM 06/18/2014, 10:07 AM

## 2014-06-18 NOTE — Anesthesia Postprocedure Evaluation (Signed)
Anesthesia Post Note  Patient: Jennifer Mcmahon  Procedure(s) Performed: * No procedures listed *  Anesthesia type: Epidural  Patient location: Mother/Baby  Post pain: Pain level controlled  Post assessment: Post-op Vital signs reviewed  Last Vitals:  Filed Vitals:   06/18/14 0603  BP: 141/87  Pulse: 79  Temp: 36.5 C  Resp: 18    Post vital signs: Reviewed  Level of consciousness: awake  Complications: No apparent anesthesia complications

## 2014-06-19 MED ORDER — IBUPROFEN 600 MG PO TABS: 600.0000 mg | ORAL_TABLET | Freq: Four times a day (QID) | ORAL | Status: DC

## 2014-06-19 MED ORDER — ENOXAPARIN SODIUM 40 MG/0.4ML ~~LOC~~ SOLN: 40.0000 mg | SUBCUTANEOUS | Status: DC

## 2014-06-19 NOTE — Plan of Care (Signed)
Problem: Discharge Progression Outcomes Goal: Complications resolved/controlled Outcome: Completed/Met Date Met:  06/19/14

## 2014-06-19 NOTE — Plan of Care (Signed)
Problem: Discharge Progression Outcomes Goal: Remove staples per MD order Outcome: Not Applicable Date Met:  06/19/14     

## 2014-06-19 NOTE — Plan of Care (Signed)
Problem: Phase II Progression Outcomes Goal: Pain controlled on oral analgesia Outcome: Completed/Met Date Met:  06/19/14 Goal: Progress activity as tolerated unless otherwise ordered Outcome: Completed/Met Date Met:  06/19/14 Goal: Afebrile, VS remain stable Outcome: Completed/Met Date Met:  06/19/14 Goal: Tolerating diet Outcome: Completed/Met Date Met:  06/19/14  Problem: Discharge Progression Outcomes Goal: Tolerating diet Outcome: Completed/Met Date Met:  06/19/14 Goal: Pain controlled with appropriate interventions Outcome: Completed/Met Date Met:  06/19/14 Goal: Discharge plan in place and appropriate Outcome: Completed/Met Date Met:  06/19/14

## 2014-06-19 NOTE — Lactation Note (Signed)
This note was copied from the chart of Jennifer Lenice Pressmaneresa Meuser. Lactation Consultation Note  Mother has a  History of low MS.  She reports having hypothyroidism and GDM.  We discussed monitoring her thyroid.  She may discuss use of metformin with her OB because this can aid mothers with GDM in milk production.  A breast pump was rented with instructions to post-pump to aid in optimal MS.  Mother will call and schedule an appointment for follow-up.  Patient Name: Jennifer Mcmahon ZOXWR'UToday's Date: 06/19/2014     Maternal Data    Feeding    LATCH Score/Interventions                      Lactation Tools Discussed/Used     Consult Status      Soyla DryerJoseph, Armiyah Capron 06/19/2014, 12:26 PM

## 2014-06-19 NOTE — Progress Notes (Signed)
PPD #2 - SVD  Subjective:   Reports feeling a little sore, but well Tolerating po/ No nausea or vomiting Bleeding is light No HA, visual disturbances, or epigastric pain Pain controlled with Motrin Up ad lib / ambulatory / voiding without problems Newborn: breastfeeding  / Circumcision: planning prior to d/c   Objective:   VS:  VS:  Filed Vitals:   06/18/14 1300 06/18/14 1720 06/18/14 2205 06/19/14 0600  BP: 129/81 125/81 138/84 114/79  Pulse: 83 77 80 75  Temp: 98.1 F (36.7 C) 97.9 F (36.6 C)  97.5 F (36.4 C)  TempSrc: Oral Oral  Oral  Resp: 18 20  18   Height:      Weight:      SpO2: 98%       LABS:   Recent Labs  06/17/14 2105 06/18/14 0600  WBC 16.9* 12.4*  HGB 11.4* 11.2*  PLT 192 174   Blood type: A POS (11/15 0815) Rubella: Immune (04/15 0000)   I&O: Intake/Output      11/16 0701 - 11/17 0700 11/17 0701 - 11/18 0700   Urine (mL/kg/hr)     Blood     Total Output       Net            Stool Occurrence 1 x      Physical Exam: Alert and oriented x3 Abdomen: soft, non-tender, non-distended  Fundus: firm, non-tender, U-2 Perineum: intact Lochia: small Extremities: No edema, no calf pain or tenderness    Assessment:  PPD #2 / [id number] / S/P:induced vaginal  Gestational HTN, delivered A1GDM, delivered - stable Hypothyroidism DVT history  Doing well    Plan: Continue Lovenox pp Continue Labetalol-BP stable Continue routine post partum orders D/C Home today   Raelyn MoraAWSON, Latresa Gasser, M MSN, CNM 06/19/2014, 8:18 AM

## 2014-06-19 NOTE — Discharge Instructions (Signed)
Breast Pumping Tips If you are breastfeeding, there may be times when you cannot feed your baby directly. Returning to work or going on a trip are examples. Pumping allows you to store breast milk and feed it to your baby later.  You may not get much milk when you first start to pump. Your breasts should start to make more after a few days. If you pump at the times you usually feed your baby, you may be able to keep making enough milk to feed your baby without also using formula. The more often you pump, the more milk your body will make. WHEN SHOULD I PUMP?   You can start to pump soon after you have your baby. Ask your doctor what is right for you and your baby.  If you are going back to work, start pumping a few weeks before. This gives you time to learn how to pump and to store a supply of milk.  When you are with your baby, feed your baby when he or she is hungry. Pump after each feeding.  When you are away from your baby for many hours, pump for about 15 minutes every 2-3 hours. Pump both breasts at the same time if you can.  If your baby has a formula feeding, make sure to pump close to the same time.  If you drink any alcohol, wait 2 hours before pumping. HOW DO I GET READY TO PUMP? Your let-down reflex is your body's natural reaction that makes your breast milk flow. It is easier to make your breast milk flow when you are relaxed. Try these things to help you relax:  Smell one of your baby's blankets or an item of clothing.  Look at a picture or video of your baby.  Sit in a quiet, private space.  Massage the breast you plan to pump.  Place soothing warmth on the breast.  Play relaxing music. WHAT ARE SOME BREAST PUMPING TIPS?  Wash your hands before you pump. You do not need to wash your nipples or breasts.  There are three ways to pump. You can:  Use your hand to massage and squeeze your breast.  Use a handheld manual pump.  Use an electric pump.  Make sure the  suction cup on the breast pump is the right size. Place the suction cup directly over the nipple. It can be painful or hurt your nipple if it is the wrong size or placed wrong.  Put a small amount of purified or modified lanolin on your nipple and areola if you are sore.  If you are using an electric pump, change the speed and suction power to be more comfortable.  You may need a different type of pump if pumping hurts or you do not get a lot of milk. Your doctor can help you pick what type of pump to use.  Keep a full water bottle near you always. Drinking lots of fluid helps you make more milk.  You can store your milk to use later. Pumped breast milk can be stored in a sealable, sterile container or plastic bag. Always put the date you pumped it on the container.  Milk can stay out at room temperature for up to 8 hours.  You can store your milk in the refrigerator for up to 8 days.  You can store your milk in the freezer for 3 months. Thaw frozen milk using warm water. Do not put it in the microwave.  Do not smoke.  Ask your doctor for help. WHEN SHOULD I CALL MY DOCTOR?  You have a hard time pumping.  You are worried you do not make enough milk.  You have nipple pain, soreness, or redness.  You want to take birth control pills. Document Released: 01/06/2008 Document Revised: 07/25/2013 Document Reviewed: 05/12/2013 [facility] Patient Information 2015 [place], [place]. This information is not intended to replace advice given to you by your health care provider. Make sure you discuss any questions you have with your health care provider. Postpartum Depression and Baby Blues The postpartum period begins right after the birth of a baby. During this time, there is often a great amount of joy and excitement. It is also a time of many changes in the life of the parents. Regardless of how many times a mother gives birth, each child brings new challenges and dynamics to the family. It is not  unusual to have feelings of excitement along with confusing shifts in moods, emotions, and thoughts. All mothers are at risk of developing postpartum depression or the "baby blues." These mood changes can occur right after giving birth, or they may occur many months after giving birth. The baby blues or postpartum depression can be mild or severe. Additionally, postpartum depression can go away rather quickly, or it can be a long-term condition.  CAUSES Raised hormone levels and the rapid drop in those levels are thought to be a main cause of postpartum depression and the baby blues. A number of hormones change during and after pregnancy. Estrogen and progesterone usually decrease right after the delivery of your baby. The levels of thyroid hormone and various cortisol steroids also rapidly drop. Other factors that play a role in these mood changes include major life events and genetics.  RISK FACTORS If you have any of the following risks for the baby blues or postpartum depression, know what symptoms to watch out for during the postpartum period. Risk factors that may increase the likelihood of getting the baby blues or postpartum depression include:  Having a personal or family history of depression.   Having depression while being pregnant.   Having premenstrual mood issues or mood issues related to oral contraceptives.  Having a lot of life stress.   Having marital conflict.   Lacking a social support network.   Having a baby with special needs.   Having health problems, such as diabetes.  SIGNS AND SYMPTOMS Symptoms of baby blues include:  Brief changes in mood, such as going from extreme happiness to sadness.  Decreased concentration.   Difficulty sleeping.   Crying spells, tearfulness.   Irritability.   Anxiety.  Symptoms of postpartum depression typically begin within the first month after giving birth. These symptoms include:  Difficulty sleeping or  excessive sleepiness.   Marked weight loss.   Agitation.   Feelings of worthlessness.   Lack of interest in activity or food.  Postpartum psychosis is a very serious condition and can be dangerous. Fortunately, it is rare. Displaying any of the following symptoms is cause for immediate medical attention. Symptoms of postpartum psychosis include:   Hallucinations and delusions.   Bizarre or disorganized behavior.   Confusion or disorientation.  DIAGNOSIS  A diagnosis is made by an evaluation of your symptoms. There are no medical or lab tests that lead to a diagnosis, but there are various questionnaires that a health care provider may use to identify those with the baby blues, postpartum depression, or psychosis. Often, a screening tool called the [place]  Postnatal Depression Scale is used to diagnose depression in the postpartum period.  TREATMENT The baby blues usually goes away on its own in 1-2 weeks. Social support is often all that is needed. You will be encouraged to get adequate sleep and rest. Occasionally, you may be given medicines to help you sleep.  Postpartum depression requires treatment because it can last several months or longer if it is not treated. Treatment may include individual or group therapy, medicine, or both to address any social, physiological, and psychological factors that may play a role in the depression. Regular exercise, a healthy diet, rest, and social support may also be strongly recommended.  Postpartum psychosis is more serious and needs treatment right away. Hospitalization is often needed. HOME CARE INSTRUCTIONS  Get as much rest as you can. Nap when the baby sleeps.   Exercise regularly. Some women find yoga and walking to be beneficial.   Eat a balanced and nourishing diet.   Do little things that you enjoy. Have a cup of tea, take a bubble bath, read your favorite magazine, or listen to your favorite music.  Avoid alcohol.    Ask for help with household chores, cooking, grocery shopping, or running errands as needed. Do not try to do everything.   Talk to people close to you about how you are feeling. Get support from your partner, family members, friends, or other new moms.  Try to stay positive in how you think. Think about the things you are grateful for.   Do not spend a lot of time alone.   Only take over-the-counter or prescription medicine as directed by your health care provider.  Keep all your postpartum appointments.   Let your health care provider know if you have any concerns.  SEEK MEDICAL CARE IF: You are having a reaction to or problems with your medicine. SEEK IMMEDIATE MEDICAL CARE IF:  You have suicidal feelings.   You think you may harm the baby or someone else. MAKE SURE YOU:  Understand these instructions.  Will watch your condition.  Will get help right away if you are not doing well or get worse. Document Released: 04/23/2004 Document Revised: 07/25/2013 Document Reviewed: 05/01/2013 [facility] Patient Information 2015 [place], [place]. This information is not intended to replace advice given to you by your health care provider. Make sure you discuss any questions you have with your health care provider. Breastfeeding and Mastitis Mastitis is inflammation of the breast tissue. It can occur in women who are breastfeeding. This can make breastfeeding painful. Mastitis will sometimes go away on its own. Your health care provider will help determine if treatment is needed. CAUSES Mastitis is often associated with a blocked milk (lactiferous) duct. This can happen when too much milk builds up in the breast. Causes of excess milk in the breast can include:  Poor latch-on. If your baby is not latched onto the breast properly, she or he may not empty your breast completely while breastfeeding.  Allowing too much time to pass between feedings.  Wearing a bra or other clothing  that is too tight. This puts extra pressure on the lactiferous ducts so milk does not flow through them as it should. Mastitis can also be caused by a bacterial infection. Bacteria may enter the breast tissue through cuts or openings in the skin. In women who are breastfeeding, this may occur because of cracked or irritated skin. Cracks in the skin are often caused when your baby does not latch on properly  to the breast. SIGNS AND SYMPTOMS  Swelling, redness, tenderness, and pain in an area of the breast.  Swelling of the glands under the arm on the same side.  Fever may or may not accompany mastitis. If an infection is allowed to progress, a collection of pus (abscess) may develop. DIAGNOSIS  Your health care provider can usually diagnose mastitis based on your symptoms and a physical exam. Tests may be done to help confirm the diagnosis. These may include:  Removal of pus from the breast by applying pressure to the area. This pus can be examined in the lab to determine which bacteria are present. If an abscess has developed, the fluid in the abscess can be removed with a needle. This can also be used to confirm the diagnosis and determine the bacteria present. In most cases, pus will not be present.  Blood tests to determine if your body is fighting a bacterial infection.  Mammogram or ultrasound tests to rule out other problems or diseases. TREATMENT  Mastitis that occurs with breastfeeding will sometimes go away on its own. Your health care provider may choose to wait 24 hours after first seeing you to decide whether a prescription medicine is needed. If your symptoms are worse after 24 hours, your health care provider will likely prescribe an antibiotic medicine to treat the mastitis. He or she will determine which bacteria are most likely causing the infection and will then select an appropriate antibiotic medicine. This is sometimes changed based on the results of tests performed to  identify the bacteria, or if there is no response to the antibiotic medicine selected. Antibiotic medicines are usually given by mouth. You may also be given medicine for pain. HOME CARE INSTRUCTIONS  Only take over-the-counter or prescription medicines for pain, fever, or discomfort as directed by your health care provider.  If your health care provider prescribed an antibiotic medicine, take the medicine as directed. Make sure you finish it even if you start to feel better.  Do not wear a tight or underwire bra. Wear a soft, supportive bra.  Increase your fluid intake, especially if you have a fever.  Continue to empty the breast. Your health care provider can tell you whether this milk is safe for your infant or needs to be thrown out. You may be told to stop nursing until your health care provider thinks it is safe for your baby. Use a breast pump if you are advised to stop nursing.  Keep your nipples clean and dry.  Empty the first breast completely before going to the other breast. If your baby is not emptying your breasts completely for some reason, use a breast pump to empty your breasts.  If you go back to work, pump your breasts while at work to stay in time with your nursing schedule.  Avoid allowing your breasts to become overly filled with milk (engorged). SEEK MEDICAL CARE IF:  You have pus-like discharge from the breast.  Your symptoms do not improve with the treatment prescribed by your health care provider within 2 days. SEEK IMMEDIATE MEDICAL CARE IF:  Your pain and swelling are getting worse.  You have pain that is not controlled with medicine.  You have a red line extending from the breast toward your armpit.  You have a fever or persistent symptoms for more than 2-3 days.  You have a fever and your symptoms suddenly get worse. MAKE SURE YOU:   Understand these instructions.  Will watch your condition.  Will get help right away if you are not doing well  or get worse. Document Released: 11/14/2004 Document Revised: 07/25/2013 Document Reviewed: 02/23/2013 [facility] Patient Information 2015 [place]. This information is not intended to replace advice given to you by your health care provider. Make sure you discuss any questions you have with your health care provider. Breastfeeding Deciding to breastfeed is one of the best choices you can make for you and your baby. A change in hormones during pregnancy causes your breast tissue to grow and increases the number and size of your milk ducts. These hormones also allow proteins, sugars, and fats from your blood supply to make breast milk in your milk-producing glands. Hormones prevent breast milk from being released before your baby is born as well as prompt milk flow after birth. Once breastfeeding has begun, thoughts of your baby, as well as his or her sucking or crying, can stimulate the release of milk from your milk-producing glands.  BENEFITS OF BREASTFEEDING For Your Baby  Your first milk (colostrum) helps your baby's digestive system function better.   There are antibodies in your milk that help your baby fight off infections.   Your baby has a lower incidence of asthma, allergies, and sudden infant death syndrome.   The nutrients in breast milk are better for your baby than infant formulas and are designed uniquely for your baby's needs.   Breast milk improves your baby's brain development.   Your baby is less likely to develop other conditions, such as childhood obesity, asthma, or type 2 diabetes mellitus.  For You   Breastfeeding helps to create a very special bond between you and your baby.   Breastfeeding is convenient. Breast milk is always available at the correct temperature and costs nothing.   Breastfeeding helps to burn calories and helps you lose the weight gained during pregnancy.   Breastfeeding makes your uterus contract to its prepregnancy size faster and  slows bleeding (lochia) after you give birth.   Breastfeeding helps to lower your risk of developing type 2 diabetes mellitus, osteoporosis, and breast or ovarian cancer later in life. SIGNS THAT YOUR BABY IS HUNGRY Early Signs of Hunger  Increased alertness or activity.  Stretching.  Movement of the head from side to side.  Movement of the head and opening of the mouth when the corner of the mouth or cheek is stroked (rooting).  Increased sucking sounds, smacking lips, cooing, sighing, or squeaking.  Hand-to-mouth movements.  Increased sucking of fingers or hands. Late Signs of Hunger  Fussing.  Intermittent crying. Extreme Signs of Hunger Signs of extreme hunger will require calming and consoling before your baby will be able to breastfeed successfully. Do not wait for the following signs of extreme hunger to occur before you initiate breastfeeding:   Restlessness.  A loud, strong cry.   Screaming. BREASTFEEDING BASICS Breastfeeding Initiation  Find a comfortable place to sit or lie down, with your neck and back well supported.  Place a pillow or rolled up blanket under your baby to bring him or her to the level of your breast (if you are seated). Nursing pillows are specially designed to help support your arms and your baby while you breastfeed.  Make sure that your baby's abdomen is facing your abdomen.   Gently massage your breast. With your fingertips, massage from your chest wall toward your nipple in a circular motion. This encourages milk flow. You may need to continue this action during the feeding if your  milk flows slowly.  Support your breast with 4 fingers underneath and your thumb above your nipple. Make sure your fingers are well away from your nipple and your baby's mouth.   Stroke your baby's lips gently with your finger or nipple.   When your baby's mouth is open wide enough, quickly bring your baby to your breast, placing your entire nipple and  as much of the colored area around your nipple (areola) as possible into your baby's mouth.   More areola should be visible above your baby's upper lip than below the lower lip.   Your baby's tongue should be between his or her lower gum and your breast.   Ensure that your baby's mouth is correctly positioned around your nipple (latched). Your baby's lips should create a seal on your breast and be turned out (everted).  It is common for your baby to suck about 2-3 minutes in order to start the flow of breast milk. Latching Teaching your baby how to latch on to your breast properly is very important. An improper latch can cause nipple pain and decreased milk supply for you and poor weight gain in your baby. Also, if your baby is not latched onto your nipple properly, he or she may swallow some air during feeding. This can make your baby fussy. Burping your baby when you switch breasts during the feeding can help to get rid of the air. However, teaching your baby to latch on properly is still the best way to prevent fussiness from swallowing air while breastfeeding. Signs that your baby has successfully latched on to your nipple:    Silent tugging or silent sucking, without causing you pain.   Swallowing heard between every 3-4 sucks.    Muscle movement above and in front of his or her ears while sucking.  Signs that your baby has not successfully latched on to nipple:   Sucking sounds or smacking sounds from your baby while breastfeeding.  Nipple pain. If you think your baby has not latched on correctly, slip your finger into the corner of your baby's mouth to break the suction and place it between your baby's gums. Attempt breastfeeding initiation again. Signs of Successful Breastfeeding Signs from your baby:   A gradual decrease in the number of sucks or complete cessation of sucking.   Falling asleep.   Relaxation of his or her body.   Retention of a small amount of milk  in his or her mouth.   Letting go of your breast by himself or herself. Signs from you:  Breasts that have increased in firmness, weight, and size 1-3 hours after feeding.   Breasts that are softer immediately after breastfeeding.  Increased milk volume, as well as a change in milk consistency and color by the fifth day of breastfeeding.   Nipples that are not sore, cracked, or bleeding. Signs That Your Pecola Leisure is Getting Enough Milk  Wetting at least 3 diapers in a 24-hour period. The urine should be clear and pale yellow by age 412 days.  At least 3 stools in a 24-hour period by age 412 days. The stool should be soft and yellow.  At least 3 stools in a 24-hour period by age 59 days. The stool should be seedy and yellow.  No loss of weight greater than 10% of birth weight during the first 24 days of age.  Average weight gain of 4-7 ounces (113-198 g) per week after age 41 days.  Consistent daily weight gain by  age 87 days, without weight loss after the age of 2 weeks. After a feeding, your baby may spit up a small amount. This is common. BREASTFEEDING FREQUENCY AND DURATION Frequent feeding will help you make more milk and can prevent sore nipples and breast engorgement. Breastfeed when you feel the need to reduce the fullness of your breasts or when your baby shows signs of hunger. This is called "breastfeeding on demand." Avoid introducing a pacifier to your baby while you are working to establish breastfeeding (the first 4-6 weeks after your baby is born). After this time you may choose to use a pacifier. Research has shown that pacifier use during the first year of a baby's life decreases the risk of sudden infant death syndrome (SIDS). Allow your baby to feed on each breast as long as he or she wants. Breastfeed until your baby is finished feeding. When your baby unlatches or falls asleep while feeding from the first breast, offer the second breast. Because newborns are often sleepy in the  first few weeks of life, you may need to awaken your baby to get him or her to feed. Breastfeeding times will vary from baby to baby. However, the following rules can serve as a guide to help you ensure that your baby is properly fed:  Newborns (babies 36 weeks of age or younger) may breastfeed every 1-3 hours.  Newborns should not go longer than 3 hours during the day or 5 hours during the night without breastfeeding.  You should breastfeed your baby a minimum of 8 times in a 24-hour period until you begin to introduce solid foods to your baby at around 66 months of age. BREAST MILK PUMPING Pumping and storing breast milk allows you to ensure that your baby is exclusively fed your breast milk, even at times when you are unable to breastfeed. This is especially important if you are going back to work while you are still breastfeeding or when you are not able to be present during feedings. Your lactation consultant can give you guidelines on how long it is safe to store breast milk.  A breast pump is a machine that allows you to pump milk from your breast into a sterile bottle. The pumped breast milk can then be stored in a refrigerator or freezer. Some breast pumps are operated by hand, while others use electricity. Ask your lactation consultant which type will work best for you. Breast pumps can be purchased, but some hospitals and breastfeeding support groups lease breast pumps on a monthly basis. A lactation consultant can teach you how to hand express breast milk, if you prefer not to use a pump.  CARING FOR YOUR BREASTS WHILE YOU BREASTFEED Nipples can become dry, cracked, and sore while breastfeeding. The following recommendations can help keep your breasts moisturized and healthy:  Avoid using soap on your nipples.   Wear a supportive bra. Although not required, special nursing bras and tank tops are designed to allow access to your breasts for breastfeeding without taking off your entire bra  or top. Avoid wearing underwire-style bras or extremely tight bras.  Air dry your nipples for 3-69minutes after each feeding.   Use only cotton bra pads to absorb leaked breast milk. Leaking of breast milk between feedings is normal.   Use lanolin on your nipples after breastfeeding. Lanolin helps to maintain your skin's normal moisture barrier. If you use pure lanolin, you do not need to wash it off before feeding your baby again. Pure lanolin  is not toxic to your baby. You may also hand express a few drops of breast milk and gently massage that milk into your nipples and allow the milk to air dry. In the first few weeks after giving birth, some women experience extremely full breasts (engorgement). Engorgement can make your breasts feel heavy, warm, and tender to the touch. Engorgement peaks within 3-5 days after you give birth. The following recommendations can help ease engorgement:  Completely empty your breasts while breastfeeding or pumping. You may want to start by applying warm, moist heat (in the shower or with warm water-soaked hand towels) just before feeding or pumping. This increases circulation and helps the milk flow. If your baby does not completely empty your breasts while breastfeeding, pump any extra milk after he or she is finished.  Wear a snug bra (nursing or regular) or tank top for 1-2 days to signal your body to slightly decrease milk production.  Apply ice packs to your breasts, unless this is too uncomfortable for you.  Make sure that your baby is latched on and positioned properly while breastfeeding. If engorgement persists after 48 hours of following these recommendations, contact your health care provider or a Advertising copywriter. OVERALL HEALTH CARE RECOMMENDATIONS WHILE BREASTFEEDING  Eat healthy foods. Alternate between meals and snacks, eating 3 of each per day. Because what you eat affects your breast milk, some of the foods may make your baby more irritable  than usual. Avoid eating these foods if you are sure that they are negatively affecting your baby.  Drink milk, fruit juice, and water to satisfy your thirst (about 10 glasses a day).   Rest often, relax, and continue to take your prenatal vitamins to prevent fatigue, stress, and anemia.  Continue breast self-awareness checks.  Avoid chewing and smoking tobacco.  Avoid alcohol and drug use. Some medicines that may be harmful to your baby can pass through breast milk. It is important to ask your health care provider before taking any medicine, including all over-the-counter and prescription medicine as well as vitamin and herbal supplements. It is possible to become pregnant while breastfeeding. If birth control is desired, ask your health care provider about options that will be safe for your baby. SEEK MEDICAL CARE IF:   You feel like you want to stop breastfeeding or have become frustrated with breastfeeding.  You have painful breasts or nipples.  Your nipples are cracked or bleeding.  Your breasts are red, tender, or warm.  You have a swollen area on either breast.  You have a fever or chills.  You have nausea or vomiting.  You have drainage other than breast milk from your nipples.  Your breasts do not become full before feedings by the fifth day after you give birth.  You feel sad and depressed.  Your baby is too sleepy to eat well.  Your baby is having trouble sleeping.   Your baby is wetting less than 3 diapers in a 24-hour period.  Your baby has less than 3 stools in a 24-hour period.  Your baby's skin or the white part of his or her eyes becomes yellow.   Your baby is not gaining weight by 4 days of age. SEEK IMMEDIATE MEDICAL CARE IF:   Your baby is overly tired (lethargic) and does not want to wake up and feed.  Your baby develops an unexplained fever. Document Released: 07/20/2005 Document Revised: 07/25/2013 Document Reviewed: 01/11/2013 [facility]  Patient Information 2015 [place], [place]. This information is not  intended to replace advice given to you by your health care provider. Make sure you discuss any questions you have with your health care provider. ° °

## 2014-06-19 NOTE — Plan of Care (Signed)
Problem: Discharge Progression Outcomes Goal: Barriers To Progression Addressed/Resolved Outcome: Completed/Met Date Met:  06/19/14     

## 2014-06-19 NOTE — Discharge Summary (Signed)
Obstetric Discharge Summary  Reason for Admission: Pt is a G2P2002 at 216w0d IOL due to gestational HTN on labetalol, polyhydramnios, Class A1 GDM Diet controlled, Hypothyroidiism on med. Hx PE requiring lovenox   [facility] since 8.3 wks, with Dr. Cherly Hensenousins as primary provider.  Medications on Admission: Prescriptions prior to admission  Medication Sig Dispense Refill Last Dose  . aspirin 81 MG tablet Take 81 mg by mouth daily.   06/16/2014 at Unknown time  . calcium carbonate (TUMS - DOSED IN MG ELEMENTAL CALCIUM) 500 MG chewable tablet Chew 2 tablets by mouth 2 (two) times daily as needed for indigestion.    06/16/2014 at Unknown time  . iron polysaccharides (NU-IRON) 150 MG capsule Take 150 mg by mouth 2 (two) times daily.   06/16/2014 at Unknown time  . labetalol (NORMODYNE) 100 MG tablet Take 1 tablet (100 mg total) by mouth 2 (two) times daily. 60 tablet 0 06/17/2014 at Unknown time  . levothyroxine (SYNTHROID, LEVOTHROID) 100 MCG tablet TAKE 1 TABLET BY MOUTH ONCE DAILY 90 tablet 0 06/16/2014 at Unknown time  . Prenatal Multivit-Min-Fe-FA (PRENATAL VITAMINS PO) Take 1 each by mouth.   06/17/2014 at Unknown time  . ranitidine (ZANTAC) 150 MG tablet Take 150 mg by mouth at bedtime.   06/16/2014 at Unknown time    Prenatal Labs: ABO, Rh: A POS (11/15 0815)  Antibody: NEG (11/15 0815) Rubella: Immune (04/15 0000)   RPR: NON REAC (11/15 0815)  HBsAg: Negative (04/15 0000)  HIV: Non-reactive (04/15 0000)  GTT : Abnormal GBS: Negative (10/23 0000)   Prenatal Procedures: NST and ultrasound Intrapartum Course: Admitted for IOL @ [redacted] weeks gestation due to gestational HTN on labetalol, polyhydramnios, Class A1 GDM Diet controlled, Hypothyroidiism on med. Hx PE requiring lovenox  Postpartum only/ pitocin / IUPC / amnioinfusion for variable decels / normal progression to complete dilation / SVD of viable female over intact perineum by Dr. Cherly Hensenousins / no immediate  postpartum complications noted. Intrapartum Procedures: spontaneous vaginal delivery Postpartum Procedures: none Complications-Operative and Postpartum: none  Labs: HEMOGLOBIN  Date Value Ref Range Status  06/18/2014 11.2* 12.0 - 15.0 g/dL Final   HGB  Date Value Ref Range Status  08/21/2008 11.9 11.6 - 15.9 g/dL Final   HCT  Date Value Ref Range Status  06/18/2014 32.9* 36.0 - 46.0 % Final  08/21/2008 34.7* 34.8 - 46.6 % Final   Lab Results  Component Value Date   PLT 174 06/18/2014    Newborn Data: Live born female  Birth Weight: 8 lb 5 oz (3771 g) APGAR: 8,   Home with mother.   Discharge Information: Date: 06/19/2014 Discharge Diagnoses:  Pt is a G2P2002 at 696w0d S/P Term Pregnancy-delivered and GHTN, GDMA1 on 06/17/2014  Condition: stable Activity: pelvic rest Diet: routine Medications:    Medication List    STOP taking these medications        aspirin 81 MG tablet      TAKE these medications        calcium carbonate 500 MG chewable tablet  Commonly known as:  TUMS - dosed in mg elemental calcium  Chew 2 tablets by mouth 2 (two) times daily as needed for indigestion.     enoxaparin 40 MG/0.4ML injection  Commonly known as:  LOVENOX  Inject 0.4 mLs (40 mg total) into the skin daily.     ibuprofen 600 MG tablet  Commonly known as:  ADVIL,MOTRIN  Take 1 tablet (600 mg total) by  mouth every 6 (six) hours.     labetalol 100 MG tablet  Commonly known as:  NORMODYNE  Take 1 tablet (100 mg total) by mouth 2 (two) times daily.     levothyroxine 100 MCG tablet  Commonly known as:  SYNTHROID, LEVOTHROID  TAKE 1 TABLET BY MOUTH ONCE DAILY     NU-IRON 150 MG capsule  Generic drug:  iron polysaccharides  Take 150 mg by mouth 2 (two) times daily.     PRENATAL VITAMINS PO  Take 1 each by mouth.     ranitidine 150 MG tablet  Commonly known as:  ZANTAC  Take 150 mg by mouth at bedtime.       Instructions: [facility] instruction booklet has  been given and reviewed Discharge to: home     Follow-up Information    Follow up with Azure Budnick A, MD. Schedule an appointment as soon as possible for a visit in 6 weeks.   Specialty:  Obstetrics and Gynecology   Why:  postpartum visit   Contact information:   [address] [place] [id number] [phone]       Raelyn MoraAWSON, ROLITTA, Judie PetitM, MSN, CNM 06/19/2014, 8:37 AM

## 2014-06-19 NOTE — Plan of Care (Signed)
Problem: Discharge Progression Outcomes Goal: Activity appropriate for discharge plan Outcome: Completed/Met Date Met:  06/19/14     

## 2014-06-19 NOTE — Plan of Care (Signed)
Problem: Discharge Progression Outcomes Goal: Afebrile, VS remain stable at discharge Outcome: Completed/Met Date Met:  06/19/14

## 2014-06-20 ENCOUNTER — Encounter (HOSPITAL_COMMUNITY): Payer: Self-pay | Admitting: *Deleted

## 2014-07-19 ENCOUNTER — Encounter (HOSPITAL_COMMUNITY)
Admission: RE | Admit: 2014-07-19 | Discharge: 2014-07-19 | Disposition: A | Discharge status: Home / Self Care | Payer: BC Managed Care – PPO | Source: Ambulatory Visit | Attending: Obstetrics and Gynecology | Admitting: Obstetrics and Gynecology

## 2014-08-19 ENCOUNTER — Encounter (HOSPITAL_COMMUNITY)
Admission: RE | Admit: 2014-08-19 | Discharge: 2014-08-19 | Disposition: A | Discharge status: Home / Self Care | Payer: BC Managed Care – PPO | Source: Ambulatory Visit | Attending: Obstetrics and Gynecology | Admitting: Obstetrics and Gynecology

## 2014-08-20 ENCOUNTER — Other Ambulatory Visit: Payer: Self-pay | Admitting: Internal Medicine

## 2014-08-20 NOTE — Telephone Encounter (Signed)
Refill x 30 days. Needs OV

## 2014-08-20 NOTE — Telephone Encounter (Signed)
Left message for patient to call back to schedule appt.

## 2014-08-28 ENCOUNTER — Other Ambulatory Visit: Payer: Self-pay | Admitting: Internal Medicine

## 2014-08-28 ENCOUNTER — Other Ambulatory Visit: Payer: BC Managed Care – PPO | Admitting: Internal Medicine

## 2014-08-28 DIAGNOSIS — E039 Hypothyroidism, unspecified: Secondary | ICD-10-CM

## 2014-08-28 LAB — TSH: TSH: 1.43 u[IU]/mL (ref 0.350–4.500)

## 2014-08-30 ENCOUNTER — Ambulatory Visit (INDEPENDENT_AMBULATORY_CARE_PROVIDER_SITE_OTHER): Payer: BC Managed Care – PPO | Admitting: Internal Medicine

## 2014-08-30 ENCOUNTER — Encounter: Payer: Self-pay | Admitting: Internal Medicine

## 2014-08-30 VITALS — BP 124/84 | HR 74 | Temp 98.0°F | Wt 192.0 lb

## 2014-08-30 DIAGNOSIS — I10 Essential (primary) hypertension: Secondary | ICD-10-CM

## 2014-08-30 DIAGNOSIS — O24410 Gestational diabetes mellitus in pregnancy, diet controlled: Secondary | ICD-10-CM | POA: Diagnosis not present

## 2014-08-30 DIAGNOSIS — E039 Hypothyroidism, unspecified: Secondary | ICD-10-CM

## 2014-08-30 LAB — HEMOGLOBIN A1C
HEMOGLOBIN A1C: 5.2 % (ref <5.7)
MEAN PLASMA GLUCOSE: 103 mg/dL (ref <117)

## 2014-08-30 NOTE — Patient Instructions (Addendum)
Return in July for physical examination. Home blood pressure monitor prescription written. Call if blood pressure persistently elevated over 130 systolically and over 85 diastolically. Hemoglobin A1c added today. TSH is normal on present dose of thyroid replacement

## 2014-08-30 NOTE — Progress Notes (Signed)
   Subjective:    Patient ID: Jennifer Mcmahon, female    DOB: 10/16/1980, 34 y.o.   MRN: [id number]  HPI  34 year old Female with hypothyroidism s/p vaginal delivery November 2015. Delivered son whom she is breast feeding. She had gestational diabetes in the third trimester. History of hypothyroidism. Is on thyroid replacement therapy consisting of levothyroxin 0.1 mg daily. Not seen here since the fall of 2014. Here today to follow-up on hypothyroidism. A month ago hemoglobin A1c was 5.2%. During pregnancy she was on labetalol. This is now been discontinued.    Review of Systems     Objective:   Physical Exam  No thyromegaly. Chest clear. Cardiac exam regular rate and rhythm. Extremities without edema.      Assessment & Plan:  Hypothyroidism-TSH is pending  History of gestational diabetes-hematoma and A1c one month ago 5.2%  Status post vaginal delivery November 2015  Elevated blood pressure in pregnancy  Plan: Return every 6 months for follow-up of hypothyroidism  Blood pressure is stable today. She has some concerns about elevated blood pressure. Prescription written for home blood pressure monitor. Call if blood pressure elevation is sustained  Addendum: TSH is within normal limits. Continue same dose of Synthroid and return in 6 months.

## 2014-08-31 ENCOUNTER — Telehealth: Payer: Self-pay | Admitting: *Deleted

## 2014-08-31 NOTE — Telephone Encounter (Signed)
Reviewed lab results with patient.

## 2014-09-19 ENCOUNTER — Encounter (HOSPITAL_COMMUNITY)
Admission: RE | Admit: 2014-09-19 | Discharge: 2014-09-19 | Disposition: A | Discharge status: Home / Self Care | Payer: BC Managed Care – PPO | Source: Ambulatory Visit | Attending: Obstetrics and Gynecology | Admitting: Obstetrics and Gynecology

## 2014-10-01 ENCOUNTER — Encounter: Payer: Self-pay | Admitting: Internal Medicine

## 2014-10-19 ENCOUNTER — Encounter (HOSPITAL_COMMUNITY)
Admission: RE | Admit: 2014-10-19 | Discharge: 2014-10-19 | Disposition: A | Discharge status: Home / Self Care | Payer: BC Managed Care – PPO | Source: Ambulatory Visit | Attending: Obstetrics and Gynecology | Admitting: Obstetrics and Gynecology

## 2014-11-20 ENCOUNTER — Encounter (HOSPITAL_COMMUNITY)
Admission: RE | Admit: 2014-11-20 | Discharge: 2014-11-20 | Disposition: A | Discharge status: Home / Self Care | Payer: BC Managed Care – PPO | Source: Ambulatory Visit | Attending: Obstetrics and Gynecology | Admitting: Obstetrics and Gynecology

## 2014-12-15 ENCOUNTER — Other Ambulatory Visit: Payer: Self-pay | Admitting: Internal Medicine

## 2014-12-19 ENCOUNTER — Encounter (HOSPITAL_COMMUNITY)
Admission: RE | Admit: 2014-12-19 | Discharge: 2014-12-19 | Disposition: A | Discharge status: Home / Self Care | Payer: BC Managed Care – PPO | Source: Ambulatory Visit | Attending: Obstetrics and Gynecology | Admitting: Obstetrics and Gynecology

## 2015-01-19 ENCOUNTER — Encounter (HOSPITAL_COMMUNITY)
Admission: RE | Admit: 2015-01-19 | Discharge: 2015-01-19 | Disposition: A | Discharge status: Home / Self Care | Payer: BC Managed Care – PPO | Source: Ambulatory Visit | Attending: Obstetrics and Gynecology | Admitting: Obstetrics and Gynecology

## 2015-02-05 ENCOUNTER — Other Ambulatory Visit: Payer: BC Managed Care – PPO | Admitting: Internal Medicine

## 2015-02-07 ENCOUNTER — Encounter: Payer: BC Managed Care – PPO | Admitting: Internal Medicine

## 2015-02-19 ENCOUNTER — Encounter (HOSPITAL_COMMUNITY)
Admission: RE | Admit: 2015-02-19 | Discharge: 2015-02-19 | Disposition: A | Discharge status: Home / Self Care | Payer: BC Managed Care – PPO | Source: Ambulatory Visit | Attending: Obstetrics and Gynecology | Admitting: Obstetrics and Gynecology

## 2015-03-22 ENCOUNTER — Encounter (HOSPITAL_COMMUNITY)
Admission: RE | Admit: 2015-03-22 | Discharge: 2015-03-22 | Disposition: A | Discharge status: Home / Self Care | Payer: BC Managed Care – PPO | Source: Ambulatory Visit | Attending: Obstetrics and Gynecology | Admitting: Obstetrics and Gynecology

## 2015-04-12 ENCOUNTER — Other Ambulatory Visit: Payer: BC Managed Care – PPO | Admitting: Internal Medicine

## 2015-04-12 DIAGNOSIS — Z13220 Encounter for screening for lipoid disorders: Secondary | ICD-10-CM

## 2015-04-12 DIAGNOSIS — Z0000 Encounter for general adult medical examination without abnormal findings: Secondary | ICD-10-CM

## 2015-04-12 DIAGNOSIS — Z1321 Encounter for screening for nutritional disorder: Secondary | ICD-10-CM

## 2015-04-12 DIAGNOSIS — Z1329 Encounter for screening for other suspected endocrine disorder: Secondary | ICD-10-CM

## 2015-04-12 DIAGNOSIS — Z130 Encounter for screening for diseases of the blood and blood-forming organs and certain disorders involving the immune mechanism: Secondary | ICD-10-CM

## 2015-04-12 LAB — CBC WITH DIFFERENTIAL/PLATELET
BASOS ABS: 0 10*3/uL (ref 0.0–0.1)
Basophils Relative: 0 % (ref 0–1)
EOS ABS: 0.2 10*3/uL (ref 0.0–0.7)
EOS PCT: 2 % (ref 0–5)
HCT: 41 % (ref 36.0–46.0)
Hemoglobin: 13.9 g/dL (ref 12.0–15.0)
LYMPHS ABS: 2.2 10*3/uL (ref 0.7–4.0)
Lymphocytes Relative: 27 % (ref 12–46)
MCH: 29.7 pg (ref 26.0–34.0)
MCHC: 33.9 g/dL (ref 30.0–36.0)
MCV: 87.6 fL (ref 78.0–100.0)
MONO ABS: 0.6 10*3/uL (ref 0.1–1.0)
MPV: 10 fL (ref 8.6–12.4)
Monocytes Relative: 7 % (ref 3–12)
Neutro Abs: 5.1 10*3/uL (ref 1.7–7.7)
Neutrophils Relative %: 64 % (ref 43–77)
PLATELETS: 218 10*3/uL (ref 150–400)
RBC: 4.68 MIL/uL (ref 3.87–5.11)
RDW: 13.5 % (ref 11.5–15.5)
WBC: 8 10*3/uL (ref 4.0–10.5)

## 2015-04-12 LAB — LIPID PANEL
Cholesterol: 187 mg/dL (ref 125–200)
HDL: 51 mg/dL (ref >=46)
LDL CALC: 113 mg/dL (ref <130)
TRIGLYCERIDES: 115 mg/dL (ref <150)
Total CHOL/HDL Ratio: 3.7 Ratio (ref <=5.0)
VLDL: 23 mg/dL (ref <30)

## 2015-04-12 LAB — COMPLETE METABOLIC PANEL WITH GFR
ALT: 11 U/L (ref 6–29)
AST: 15 U/L (ref 10–30)
Albumin: 3.9 g/dL (ref 3.6–5.1)
Alkaline Phosphatase: 57 U/L (ref 33–115)
BUN: 14 mg/dL (ref 7–25)
CALCIUM: 9.6 mg/dL (ref 8.6–10.2)
CHLORIDE: 107 mmol/L (ref 98–110)
CO2: 22 mmol/L (ref 20–31)
Creat: 0.79 mg/dL (ref 0.50–1.10)
GFR, Est African American: >89 mL/min (ref >=60)
Glucose, Bld: 86 mg/dL (ref 65–99)
Potassium: 4.4 mmol/L (ref 3.5–5.3)
Sodium: 138 mmol/L (ref 135–146)
Total Bilirubin: 0.4 mg/dL (ref 0.2–1.2)
Total Protein: 7 g/dL (ref 6.1–8.1)

## 2015-04-12 LAB — TSH: TSH: 6.975 u[IU]/mL — AB (ref 0.350–4.500)

## 2015-04-13 LAB — VITAMIN D 25 HYDROXY (VIT D DEFICIENCY, FRACTURES): Vit D, 25-Hydroxy: 21 ng/mL — ABNORMAL LOW (ref 30–100)

## 2015-04-15 ENCOUNTER — Ambulatory Visit (INDEPENDENT_AMBULATORY_CARE_PROVIDER_SITE_OTHER): Payer: BC Managed Care – PPO | Admitting: Internal Medicine

## 2015-04-15 ENCOUNTER — Encounter: Payer: Self-pay | Admitting: Internal Medicine

## 2015-04-15 VITALS — BP 122/86 | HR 82 | Temp 97.6°F | Ht 67.0 in | Wt 202.0 lb

## 2015-04-15 DIAGNOSIS — E039 Hypothyroidism, unspecified: Secondary | ICD-10-CM | POA: Diagnosis not present

## 2015-04-15 DIAGNOSIS — E559 Vitamin D deficiency, unspecified: Secondary | ICD-10-CM

## 2015-04-15 DIAGNOSIS — Z86711 Personal history of pulmonary embolism: Secondary | ICD-10-CM | POA: Diagnosis not present

## 2015-04-15 DIAGNOSIS — Z0000 Encounter for general adult medical examination without abnormal findings: Secondary | ICD-10-CM | POA: Diagnosis not present

## 2015-04-15 DIAGNOSIS — E669 Obesity, unspecified: Secondary | ICD-10-CM

## 2015-04-15 DIAGNOSIS — Z23 Encounter for immunization: Secondary | ICD-10-CM

## 2015-04-15 MED ORDER — LEVOTHYROXINE SODIUM 100 MCG PO TABS: 100.0000 ug | ORAL_TABLET | Freq: Every day | ORAL | Status: DC

## 2015-04-15 MED ORDER — ERGOCALCIFEROL 1.25 MG (50000 UT) PO CAPS: 50000.0000 [IU] | ORAL_CAPSULE | ORAL | Status: DC
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
## 2015-04-15 NOTE — Patient Instructions (Signed)
Restart thyroid medication and return in 6-8 weeks. Take 50,000 units Drisdol weekly for 12 weeks and 2000 units vitamin D 3 daily. Work on diet and exercise.

## 2015-04-21 ENCOUNTER — Encounter (HOSPITAL_COMMUNITY)
Admission: RE | Admit: 2015-04-21 | Discharge: 2015-04-21 | Disposition: A | Discharge status: Home / Self Care | Payer: BC Managed Care – PPO | Source: Ambulatory Visit | Attending: Obstetrics and Gynecology | Admitting: Obstetrics and Gynecology

## 2015-05-22 ENCOUNTER — Encounter (HOSPITAL_COMMUNITY)
Admission: RE | Admit: 2015-05-22 | Discharge: 2015-05-22 | Disposition: A | Discharge status: Home / Self Care | Payer: BC Managed Care – PPO | Source: Ambulatory Visit | Attending: Obstetrics and Gynecology | Admitting: Obstetrics and Gynecology

## 2015-05-27 ENCOUNTER — Encounter: Payer: Self-pay | Admitting: Internal Medicine

## 2015-05-27 ENCOUNTER — Ambulatory Visit (INDEPENDENT_AMBULATORY_CARE_PROVIDER_SITE_OTHER): Payer: BC Managed Care – PPO | Admitting: Internal Medicine

## 2015-05-27 VITALS — BP 134/100 | HR 88 | Temp 97.8°F | Resp 18 | Ht 67.0 in | Wt 205.5 lb

## 2015-05-27 DIAGNOSIS — E038 Other specified hypothyroidism: Secondary | ICD-10-CM | POA: Diagnosis not present

## 2015-05-27 DIAGNOSIS — I10 Essential (primary) hypertension: Secondary | ICD-10-CM

## 2015-05-27 LAB — TSH: TSH: 1.967 u[IU]/mL (ref 0.350–4.500)

## 2015-05-27 MED ORDER — ADULT BLOOD PRESSURE CUFF LG KIT: PACK | Status: DC

## 2015-05-27 NOTE — Progress Notes (Signed)
   Subjective:    Patient ID: Jennifer Mcmahon, female    DOB: 06/27/1981, 34 y.o.   MRN: [id number]  HPI [id number] year old Female here today to follow-up on hypothyroidism. At last visit had not been compliant with daily dose thyroid replacement. TSH rechecked today. Her blood pressure is elevated. She's not sure why. Thinks it may be an element of office hypertension. When I checked it was 150 over 100. Nurse got similar reading. Last visit was found to be vitamin D deficient is on vitamin D supplement over-the-counter.    Review of Systems     Objective:   Physical Exam  Chest clear. Cardiac exam regular rate and rhythm. Extremities without edema. No thyromegaly      Assessment & Plan:  Elevated blood pressure  Hypothyroidism-at last visit had not been compliant with thyroid replacement therapy. Has been compliant since last visit. TSH checked today.  Plan: Prescription for [employer] home blood pressure monitor. Return in 3-4 weeks for follow-up on elevated blood pressure. TSH is pending and will be reviewed on current dose of thyroid replacement.

## 2015-05-27 NOTE — Patient Instructions (Signed)
TSH checked today. Blood pressure is elevated in office. Prescription given for home blood pressure monitor. Return with readings in 3-4 weeks.

## 2015-05-31 ENCOUNTER — Telehealth: Payer: Self-pay

## 2015-05-31 NOTE — Telephone Encounter (Signed)
Pt notified of results

## 2015-05-31 NOTE — Telephone Encounter (Signed)
-----   Message from Margaree MackintoshMary J Baxley, MD sent at 05/28/2015 10:08 AM EDT ----- Call Pt .  TSH now normal

## 2015-06-20 ENCOUNTER — Ambulatory Visit: Payer: BC Managed Care – PPO | Admitting: Internal Medicine

## 2015-06-23 ENCOUNTER — Encounter (HOSPITAL_COMMUNITY)
Admission: RE | Admit: 2015-06-23 | Discharge: 2015-06-23 | Disposition: A | Discharge status: Home / Self Care | Payer: BC Managed Care – PPO | Source: Ambulatory Visit | Attending: Obstetrics and Gynecology | Admitting: Obstetrics and Gynecology

## 2015-07-11 ENCOUNTER — Ambulatory Visit: Payer: Self-pay | Admitting: Internal Medicine

## 2015-08-07 ENCOUNTER — Other Ambulatory Visit: Payer: Self-pay

## 2015-08-07 NOTE — Telephone Encounter (Signed)
Patient called requesting refill

## 2015-08-08 MED ORDER — LEVOTHYROXINE SODIUM 100 MCG PO TABS: 100.0000 ug | ORAL_TABLET | Freq: Every day | ORAL | Status: DC

## 2015-11-30 ENCOUNTER — Other Ambulatory Visit: Payer: Self-pay | Admitting: Internal Medicine

## 2016-04-30 ENCOUNTER — Ambulatory Visit (INDEPENDENT_AMBULATORY_CARE_PROVIDER_SITE_OTHER): Payer: BC Managed Care – PPO | Admitting: Family Medicine

## 2016-04-30 ENCOUNTER — Encounter: Payer: Self-pay | Admitting: Family Medicine

## 2016-04-30 VITALS — BP 142/80 | HR 81 | Temp 98.2°F | Resp 12 | Ht 67.0 in | Wt 211.5 lb

## 2016-04-30 DIAGNOSIS — I10 Essential (primary) hypertension: Secondary | ICD-10-CM | POA: Diagnosis not present

## 2016-04-30 DIAGNOSIS — Z23 Encounter for immunization: Secondary | ICD-10-CM

## 2016-04-30 DIAGNOSIS — R1011 Right upper quadrant pain: Secondary | ICD-10-CM | POA: Diagnosis not present

## 2016-04-30 DIAGNOSIS — Z8632 Personal history of gestational diabetes: Secondary | ICD-10-CM

## 2016-04-30 DIAGNOSIS — Z6833 Body mass index (BMI) 33.0-33.9, adult: Secondary | ICD-10-CM | POA: Diagnosis not present

## 2016-04-30 DIAGNOSIS — E039 Hypothyroidism, unspecified: Secondary | ICD-10-CM

## 2016-04-30 DIAGNOSIS — Z6835 Body mass index (BMI) 35.0-35.9, adult: Secondary | ICD-10-CM | POA: Insufficient documentation

## 2016-04-30 DIAGNOSIS — E669 Obesity, unspecified: Secondary | ICD-10-CM | POA: Insufficient documentation

## 2016-04-30 MED ORDER — LEVOTHYROXINE SODIUM 100 MCG PO TABS: 100.0000 ug | ORAL_TABLET | Freq: Every day | ORAL | 0 refills | Status: DC

## 2016-04-30 NOTE — Progress Notes (Signed)
HPI:   [name] is a 35 y.o. female, who is here today to establish care with me.  Former PCP: Dr Abbott Pao Last preventive routine visit: 2015. She follows with gynecologist for her female preventive care.    Concerns today: Thyroid medication and concerned about gallbladder disease.  History of hypothyroidism since 2010, she has been on Synthroid 100 g daily until 1-2 months ago when she ran out of medication. She has noted some worsening fatigue and weight gain and thinks they might be related to hypothyroidism.  Lab Results  Component Value Date   TSH 1.967 05/27/2015    She does not exercise regularly and she is not consistent with a healthy diet.  - Intermittent, achy pain, "extreme discomfort", on lateral aspect of RUQ, rib cage. She is concerned that this pain may be related with gallbladder stones.  Pain is usually associated with food intake,  2-3 hours after a heavy or greasy food. It is associated with nausea and bloating sensation. It lasts about 2 hours. She has tried OTC Tums or Gas X but does not help usually. Alleviated by lying down and avoiding food intake.  Denies fever, vomiting, changes in bowel habits, blood in stool or melena.  Last episode about 2 weeks ago.  No Hx of trauma, no associated cough, dyspnea, wheezing, or palpitation.   -She has history of gestational diabetes with each of her pregnancies, she was taking oral medication during first pregnancy.  -Hx of depression and anxiety, currently she is not on medication and she feels like her symptoms are well controlled. She has some trouble with sleep but she attributes it to a high caffeine Intake, OTC sleep medications usually help.   Hypertension: BP mildly elevated today.  Currently she is on non pharmacologic treatment. She took Ramipril in the past, discontinued because cough. She doesn't check her BP at home.  She denies any frequent/severe headache, visual  changes, chest pain, dyspnea, palpitation, or edema.   Lab Results  Component Value Date   CREATININE 0.79 04/12/2015   BUN 14 04/12/2015   NA 138 04/12/2015   K 4.4 04/12/2015   CL 107 04/12/2015   CO2 22 04/12/2015     -History of PE in 2005, attributed to being on OCP and being a smoker.  She lives with husband and their 2 children.     Review of Systems  Constitutional: Positive for fatigue. Negative for activity change, appetite change and fever.  HENT: Negative for mouth sores, nosebleeds and trouble swallowing.   Eyes: Negative for pain, redness and visual disturbance.  Respiratory: Negative for cough, shortness of breath and wheezing.   Cardiovascular: Negative for chest pain, palpitations and leg swelling.  Gastrointestinal: Positive for nausea. Negative for abdominal pain, blood in stool and vomiting.       Negative for changes in bowel habits.  Endocrine: Negative for cold intolerance, heat intolerance, polydipsia, polyphagia and polyuria.  Genitourinary: Negative for decreased urine volume, difficulty urinating, dysuria and hematuria.  Musculoskeletal: Negative for back pain and myalgias.  Skin: Negative for color change and rash.  Neurological: Negative for syncope, weakness, numbness and headaches.  Psychiatric/Behavioral: Positive for sleep disturbance. Negative for confusion. The patient is not nervous/anxious.       No current outpatient prescriptions on file prior to visit.   No current facility-administered medications on file prior to visit.      Past Medical History:  Diagnosis Date  . Anemia   .  Anxiety   . Depression   . Gestational diabetes    diet controlled  . Hypercalcemia   . Hyperlipidemia   . Hypertension    labetolol  . Hypothyroidism   . PE (pulmonary embolism) 2004  . Postpartum care following vaginal delivery (11/15) 06/18/2014  . Thyroid disease    hypothyroidism  . Vitamin D deficiency    Allergies  Allergen Reactions   . Ramipril Cough    Family History  Problem Relation Age of Onset  . Cancer Mother     uterine and ovarian  . Hypertension Mother   . Varicose Veins Mother   . Thyroid disease Mother   . Hyperlipidemia Mother   . Mental illness Mother   . Tuberculosis Sister   . Heart disease Maternal Grandmother   . Varicose Veins Maternal Grandfather   . Mental illness Maternal Grandfather   . Bipolar disorder Paternal Grandfather   . Cancer Paternal Grandmother     Social History   Social History  . Marital status: Married    Spouse name: N/A  . Number of children: N/A  . Years of education: N/A   Social History Main Topics  . Smoking status: Former Smoker    Types: Cigarettes    Quit date: 02/25/2008  . Smokeless tobacco: Never Used  . Alcohol use Yes     Comment: social  . Drug use: No  . Sexual activity: Yes    Birth control/ protection: None, Condom   Other Topics Concern  . None   Social History Narrative  . None    Vitals:   04/30/16 1450  BP: (!) 142/80  Pulse: 81  Resp: 12  Temp: 98.2 F (36.8 C)   O2 sat 98 % at RA.   Body mass index is 33.13 kg/m.    Physical Exam  Nursing note and vitals reviewed. Constitutional: She is oriented to person, place, and time. She appears well-developed. No distress.  HENT:  Head: Atraumatic.  Mouth/Throat: Oropharynx is clear and moist and mucous membranes are normal.  Eyes: Conjunctivae and EOM are normal. Pupils are equal, round, and reactive to light.  Neck: No tracheal deviation present. No thyroid mass and no thyromegaly (palpable) present.  Cardiovascular: Normal rate and regular rhythm.   No murmur heard. Pulses:      Dorsalis pedis pulses are 2+ on the right side, and 2+ on the left side.  Respiratory: Effort normal and breath sounds normal. No respiratory distress.  GI: Soft. She exhibits no mass. There is no hepatomegaly. There is no tenderness.  Musculoskeletal: She exhibits no edema.  Mild tenderness  upon palpation of lateral aspect, right rib cage.  Lymphadenopathy:    She has no cervical adenopathy.  Neurological: She is alert and oriented to person, place, and time. She has normal strength. Coordination and gait normal.  Skin: Skin is warm. No erythema.  Psychiatric: She has a normal mood and affect. Her speech is normal.  Well groomed, good eye contact.      ASSESSMENT AND PLAN:     Rosey Batheresa was seen today for new patient (initial visit).  Diagnoses and all orders for this visit:   RUQ abdominal pain  We discussed possible causes, including musculoskeletal pain. Because association with nausea and exacerbated by greasy food intake, RUQ ultrasound will be arranged. Avoid identified trigger factors. Further recommendations will be given according to ultrasound report.  -     US Abdomen Limited RUQ; Future   Hypothyroidism, unspecified hypothyroidism type  No changes in current management, resume Synthroid 100 mcg daily. We will follow labs done today and will adjust dose is needed accordingly.   BMI 33.0-33.9,adult   We discussed benefits of wt loss as well as adverse effects of obesity. Consistency with healthy diet and physical activity recommended. If interested Weight Watchers is a good option as well as daily brisk walking for 15-30 min as tolerated.   -     CMP -     TSH -     T4, free -     levothyroxine (SYNTHROID, LEVOTHROID) 100 MCG tablet; Take 1 tablet (100 mcg total) by mouth daily.   Essential hypertension  Re-checked 145/90. For now she will continue nonpharmacologic treatment. Recommended monitoring BP at home and bringing readings to next OV. Follow-up in 3 months.   -     CMP  Hx of gestational diabetes mellitus, not currently pregnant  Primary prevention of DM II thorough healthy diet and regular exercise recommended.  -     Hemoglobin A1c  Need for immunization against influenza -     Flu Vaccine QUAD 36+ mos  IM         Betty G. [place], MD  [facility]. [place] office.

## 2016-04-30 NOTE — Patient Instructions (Addendum)
A few things to remember from today's visit:   Need for immunization against influenza - Plan: Flu Vaccine QUAD 36+ mos IM  Hypothyroidism, unspecified hypothyroidism type  BMI 33.0-33.9,adult - Plan: CMP, TSH, T4, free, levothyroxine (SYNTHROID, LEVOTHROID) 100 MCG tablet  Essential hypertension - Plan: CMP  Hx of gestational diabetes mellitus, not currently pregnant - Plan: Hemoglobin A1c  RUQ abdominal pain - Plan: US Abdomen Limited RUQ  What are some tips for weight loss? People become overweight for many reasons. Weight issues can run in families. They can be caused by unhealthy behaviors and a person's environment. Certain health problems and medicines can also lead to weight gain. There are some simple things you can do to reach and maintain a healthy weight:  Eat small more frequent healthy meals instead 3 bid meals. Also Weight Watchers is a good option. Avoid sweet drinks. These include regular soft drinks, fruit juices, fruit drinks, energy drinks, sweetened iced tea, and flavored milk. Avoid fast foods. Fast foods such as french fries, hamburgers, chicken nuggets, and pizza are high in calories and can cause weight gain. Eat a healthy breakfast. People who skip breakfast tend to weigh more. Don't watch more than two hours of television per day. Chew sugar-free gum between meals to cut down on snacking. Avoid grocery shopping when you're hungry. Pack a healthy lunch instead of eating out to control what and how much you eat. Eat a lot of fruits and vegetables. Aim for about 2 cups of fruit and 2 to 3 cups of vegetables per day. Aim for 150 minutes per week of moderate-intensity exercise (such as brisk walking), or 75 minutes per week of vigorous exercise (such as jogging or running). OR 15-30 min of daily brisk walking. Be more active. Small changes in physical activity can easily be added to your daily routine. For example, take the stairs instead of the elevator. Take a  walk with your family. A daily walk is a great way to get exercise and to catch up on the day's events.  Blood pressure goal for most people is less than 140/90. Elevated blood pressure increases the risk of strokes, heart and kidney disease, and eye problems. Regular physical activity and a healthy diet (DASH diet) usually help. Low salt diet.  Caution with some over the counter medications as cold medications, dietary products (for weight loss), and Ibuprofen or Aleve (frequent use);all these medications could cause elevation of blood pressure. We may need medication if persistently elevated.   Please be sure medication list is accurate. If a new problem present, please set up appointment sooner than planned today.

## 2016-05-01 ENCOUNTER — Other Ambulatory Visit: Payer: Self-pay | Admitting: Family Medicine

## 2016-05-01 LAB — COMPREHENSIVE METABOLIC PANEL
ALT: 11 U/L (ref 0–35)
AST: 13 U/L (ref 0–37)
Albumin: 4.2 g/dL (ref 3.5–5.2)
Alkaline Phosphatase: 49 U/L (ref 39–117)
BUN: 14 mg/dL (ref 6–23)
CO2: 28 mEq/L (ref 19–32)
CREATININE: 0.92 mg/dL (ref 0.40–1.20)
Calcium: 9.6 mg/dL (ref 8.4–10.5)
Chloride: 105 mEq/L (ref 96–112)
GFR: 72.91 mL/min (ref >60.00)
Glucose, Bld: 74 mg/dL (ref 70–99)
POTASSIUM: 4.2 meq/L (ref 3.5–5.1)
Sodium: 139 mEq/L (ref 135–145)
TOTAL PROTEIN: 7.6 g/dL (ref 6.0–8.3)
Total Bilirubin: 0.3 mg/dL (ref 0.2–1.2)

## 2016-05-01 LAB — HEMOGLOBIN A1C: Hgb A1c MFr Bld: 5.3 % (ref 4.6–6.5)

## 2016-05-01 LAB — TSH: TSH: 1.31 u[IU]/mL (ref 0.35–4.50)

## 2016-05-01 LAB — T4, FREE: Free T4: 0.88 ng/dL (ref 0.60–1.60)

## 2016-05-13 ENCOUNTER — Other Ambulatory Visit: Payer: Self-pay | Admitting: Family Medicine

## 2016-05-13 ENCOUNTER — Ambulatory Visit
Admission: RE | Admit: 2016-05-13 | Discharge: 2016-05-13 | Disposition: A | Discharge status: Home / Self Care | Payer: BC Managed Care – PPO | Source: Ambulatory Visit | Attending: Family Medicine | Admitting: Family Medicine

## 2016-05-13 DIAGNOSIS — K8020 Calculus of gallbladder without cholecystitis without obstruction: Secondary | ICD-10-CM

## 2016-05-13 DIAGNOSIS — R1011 Right upper quadrant pain: Secondary | ICD-10-CM

## 2016-05-27 ENCOUNTER — Ambulatory Visit: Payer: Self-pay | Admitting: General Surgery

## 2016-05-27 NOTE — H&P (Signed)
History of Present Illness Jennifer Mcmahon(Zeina Akkerman MD; 05/27/2016 9:11 AM) Patient words: GB.  The patient is a 35 year old female who presents for evaluation of gall stones. The patient is a 35 y/o F who is referred by Dr [place] for an evaluation of sx gallstones. Patient states that she has right upper quadrant pain that began in July of this past year. She states that the pain has been ongoing recurrent. She states it usually 2 hours after eating. She states usually with some type of meat product. She states that she is nauseated however has no emesis.  Patient on ultrasound of her abdomen which revealed: IMPRESSION: Multiple gallstones within a contracted gallbladder. No sonographic evidence of acute cholecystitis.  Heterogeneous hepatic echotexture likely reflects fatty infiltrative change. Normal common bile duct.     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 Medications Reconciled  Social History (Ammie Eversole, LPN; 28/41/324410/25/2017 8:29 AM) Alcohol use Occasional alcohol use. Caffeine use Carbonated beverages, Coffee. No drug use Tobacco use Former smoker.  Family History Jennifer Mcmahon(Ammie Eversole, LPN; 01/02/725310/25/2017 8:29 AM) Arthritis Father. Breast Cancer Family Members In General. Cancer Mother. Cervical Cancer Mother. Depression Mother. Heart disease in female family member before age 37 Hypertension  Mother. Melanoma Mother. Migraine Headache Father. Ovarian Cancer Mother. Thyroid problems Mother.  Pregnancy / Birth History Jennifer Mcmahon(Ammie Eversole, LPN; 66/44/034710/25/2017 8:29 AM) Age at menarche 11 years. Gravida 2 Length (months) of breastfeeding 12-24 Maternal age 37-30 Para 2 Regular periods    Review of Systems (Ammie Eversole LPN; 42/59/563810/25/2017 8:29 AM) General Present- Fatigue. Not Present- Appetite Loss, Chills, Fever, Night Sweats, Weight Gain and Weight Loss. Skin Present- Dryness. Not Present- Change in Wart/Mole, Hives, Jaundice, New Lesions, Non-Healing Wounds, Rash and Ulcer. HEENT Present- Seasonal Allergies, Sinus Pain and Wears glasses/contact lenses. Not Present- Earache, Hearing Loss, Hoarseness, Nose Bleed, Oral Ulcers, Ringing in the Ears, Sore Throat, Visual Disturbances and Yellow Eyes. Respiratory Not Present- Bloody sputum, Chronic Cough, Difficulty Breathing, Snoring and Wheezing. Breast Not Present- Breast Mass, Breast Pain, Nipple Discharge and Skin Changes. Cardiovascular Not Present- Chest Pain, Difficulty Breathing Lying Down, Leg Cramps, Palpitations, Rapid Heart Rate, Shortness of Breath and Swelling of Extremities. Gastrointestinal Present- Abdominal Pain, Bloating and Nausea. Not Present- Bloody Stool, Change in Bowel Habits, Chronic diarrhea, Constipation, Difficulty Swallowing, Excessive gas, Gets full quickly at meals, Hemorrhoids, Indigestion, Rectal Pain and Vomiting. Female Genitourinary Not Present- Frequency, Nocturia, Painful Urination, Pelvic Pain and Urgency. Musculoskeletal Present- Back Pain and Joint Pain. Not Present- Joint Stiffness, Muscle Pain, Muscle Weakness and Swelling of Extremities. Neurological Not Present- Decreased Memory, Fainting, Headaches, Numbness, Seizures, Tingling, Tremor, Trouble walking and Weakness. Psychiatric Not Present- Anxiety, Bipolar, Change in Sleep Pattern, Depression, Fearful and Frequent crying. Endocrine  Present- Cold Intolerance. Not Present- Excessive Hunger, Hair Changes, Heat Intolerance, Hot flashes and New Diabetes. Hematology Not Present- Blood Thinners, Easy Bruising, Excessive bleeding, Gland problems, HIV and Persistent Infections.  Vitals (Ammie Eversole LPN; 75/64/332910/25/2017 8:31 AM) 05/27/2016 8:31 AM Weight: 213.8 lb Height: 66in Body Surface Area: 2.06 m Body Mass Index: 34.51  kg/m  Temp.: 98.89F(Oral)  Pulse: 99 (Regular)  BP: 142/80 (Sitting, Left Arm, Standard)       Physical Exam Jennifer Mcmahon(Jhordan Mckibben, MD; 05/27/2016 9:11 AM) General Mental Status-Alert. General Appearance-Consistent with stated age. Hydration-Well hydrated. Voice-Normal.  Head and Neck Head-normocephalic, atraumatic with no lesions or palpable masses.  Eye Eyeball - Bilateral-Extraocular movements intact. Sclera/Conjunctiva - Bilateral-No scleral icterus.  Chest and Lung Exam Chest and lung exam reveals -quiet, even and easy respiratory effort with no use of accessory muscles. Inspection Chest Wall - Normal. Back - normal.  Cardiovascular Cardiovascular examination reveals -normal heart sounds, regular rate and rhythm with no murmurs.  Abdomen Inspection Normal Exam - No Hernias. Palpation/Percussion Normal exam - Soft, Non Tender, No Rebound tenderness, No Rigidity (guarding) and No hepatosplenomegaly. Auscultation Normal exam - Bowel sounds normal.  Neurologic Neurologic evaluation reveals -alert and oriented x 3 with no impairment of recent or remote memory. Mental Status-Normal.  Musculoskeletal Normal Exam - Left-Upper Extremity Strength Normal and Lower Extremity Strength Normal. Normal Exam - Right-Upper Extremity Strength Normal, Lower Extremity Weakness.    Assessment & Plan Jennifer Mcmahon(Roshawn Ayala MD; 05/27/2016 9:11 AM) SYMPTOMATIC CHOLELITHIASIS (K80.20) Impression: 35 year old female with significant cholelithiasis  1. We will proceed  to the operating room for a laparoscopic cholecystectomy 2. Risks and benefits were discussed with the patient to generally include, but not limited to: infection, bleeding, possible need for post op ERCP, damage to the bile ducts, bile leak, and possible need for further surgery. Alternatives were offered and described. All questions were answered and the patient voiced understanding of the procedure and wishes to proceed at this point with a laparoscopic cholecystectomy

## 2016-06-11 ENCOUNTER — Encounter (HOSPITAL_COMMUNITY): Payer: Self-pay | Admitting: *Deleted

## 2016-06-12 ENCOUNTER — Encounter (HOSPITAL_COMMUNITY)
Admission: RE | Disposition: A | Discharge status: Home / Self Care | Payer: Self-pay | Source: Ambulatory Visit | Attending: General Surgery

## 2016-06-12 ENCOUNTER — Ambulatory Visit (HOSPITAL_COMMUNITY)
Admission: RE | Admit: 2016-06-12 | Discharge: 2016-06-12 | Disposition: A | Discharge status: Home / Self Care | Payer: BC Managed Care – PPO | Source: Ambulatory Visit | Attending: General Surgery | Admitting: General Surgery

## 2016-06-12 ENCOUNTER — Ambulatory Visit (HOSPITAL_COMMUNITY): Payer: BC Managed Care – PPO | Admitting: Anesthesiology

## 2016-06-12 ENCOUNTER — Encounter (HOSPITAL_COMMUNITY): Payer: Self-pay | Admitting: *Deleted

## 2016-06-12 DIAGNOSIS — Z9889 Other specified postprocedural states: Secondary | ICD-10-CM | POA: Diagnosis not present

## 2016-06-12 DIAGNOSIS — Z803 Family history of malignant neoplasm of breast: Secondary | ICD-10-CM | POA: Diagnosis not present

## 2016-06-12 DIAGNOSIS — Z86711 Personal history of pulmonary embolism: Secondary | ICD-10-CM | POA: Diagnosis not present

## 2016-06-12 DIAGNOSIS — E079 Disorder of thyroid, unspecified: Secondary | ICD-10-CM | POA: Insufficient documentation

## 2016-06-12 DIAGNOSIS — Z8349 Family history of other endocrine, nutritional and metabolic diseases: Secondary | ICD-10-CM | POA: Insufficient documentation

## 2016-06-12 DIAGNOSIS — J302 Other seasonal allergic rhinitis: Secondary | ICD-10-CM | POA: Diagnosis not present

## 2016-06-12 DIAGNOSIS — Z79899 Other long term (current) drug therapy: Secondary | ICD-10-CM | POA: Insufficient documentation

## 2016-06-12 DIAGNOSIS — I10 Essential (primary) hypertension: Secondary | ICD-10-CM | POA: Diagnosis not present

## 2016-06-12 DIAGNOSIS — Z9109 Other allergy status, other than to drugs and biological substances: Secondary | ICD-10-CM | POA: Diagnosis not present

## 2016-06-12 DIAGNOSIS — Z808 Family history of malignant neoplasm of other organs or systems: Secondary | ICD-10-CM | POA: Insufficient documentation

## 2016-06-12 DIAGNOSIS — Z8049 Family history of malignant neoplasm of other genital organs: Secondary | ICD-10-CM | POA: Diagnosis not present

## 2016-06-12 DIAGNOSIS — Z8249 Family history of ischemic heart disease and other diseases of the circulatory system: Secondary | ICD-10-CM | POA: Diagnosis not present

## 2016-06-12 DIAGNOSIS — Z86718 Personal history of other venous thrombosis and embolism: Secondary | ICD-10-CM | POA: Diagnosis not present

## 2016-06-12 DIAGNOSIS — K8020 Calculus of gallbladder without cholecystitis without obstruction: Secondary | ICD-10-CM | POA: Diagnosis present

## 2016-06-12 DIAGNOSIS — Z87891 Personal history of nicotine dependence: Secondary | ICD-10-CM | POA: Insufficient documentation

## 2016-06-12 DIAGNOSIS — E119 Type 2 diabetes mellitus without complications: Secondary | ICD-10-CM | POA: Insufficient documentation

## 2016-06-12 DIAGNOSIS — Z8261 Family history of arthritis: Secondary | ICD-10-CM | POA: Insufficient documentation

## 2016-06-12 DIAGNOSIS — K8010 Calculus of gallbladder with chronic cholecystitis without obstruction: Secondary | ICD-10-CM | POA: Diagnosis not present

## 2016-06-12 HISTORY — PX: CHOLECYSTECTOMY: SHX55

## 2016-06-12 LAB — BASIC METABOLIC PANEL
ANION GAP: 9 (ref 5–15)
BUN: 12 mg/dL (ref 6–20)
CHLORIDE: 108 mmol/L (ref 101–111)
CO2: 21 mmol/L — AB (ref 22–32)
Calcium: 9.2 mg/dL (ref 8.9–10.3)
Creatinine, Ser: 0.75 mg/dL (ref 0.44–1.00)
GFR calc non Af Amer: >60 mL/min (ref >60)
Glucose, Bld: 91 mg/dL (ref 65–99)
Potassium: 4 mmol/L (ref 3.5–5.1)
Sodium: 138 mmol/L (ref 135–145)

## 2016-06-12 LAB — CBC
HEMATOCRIT: 41 % (ref 36.0–46.0)
HEMOGLOBIN: 13.7 g/dL (ref 12.0–15.0)
MCH: 28.6 pg (ref 26.0–34.0)
MCHC: 33.4 g/dL (ref 30.0–36.0)
MCV: 85.6 fL (ref 78.0–100.0)
Platelets: 228 10*3/uL (ref 150–400)
RBC: 4.79 MIL/uL (ref 3.87–5.11)
RDW: 12.6 % (ref 11.5–15.5)
WBC: 9.2 10*3/uL (ref 4.0–10.5)

## 2016-06-12 LAB — HCG, SERUM, QUALITATIVE: Preg, Serum: NEGATIVE

## 2016-06-12 SURGERY — LAPAROSCOPIC CHOLECYSTECTOMY
Anesthesia: General | Site: Abdomen

## 2016-06-12 MED ORDER — ROCURONIUM BROMIDE 10 MG/ML (PF) SYRINGE
PREFILLED_SYRINGE | INTRAVENOUS | Status: AC
  Filled 2016-06-12: qty 20

## 2016-06-12 MED ORDER — CEFAZOLIN SODIUM-DEXTROSE 2-3 GM-% IV SOLR: INTRAVENOUS | Status: DC | PRN

## 2016-06-12 MED ORDER — SUGAMMADEX SODIUM 200 MG/2ML IV SOLN
INTRAVENOUS | Status: AC
  Filled 2016-06-12: qty 2

## 2016-06-12 MED ORDER — ROCURONIUM BROMIDE 100 MG/10ML IV SOLN
INTRAVENOUS | Status: DC | PRN
  Administered 2016-06-12: 50 mg via INTRAVENOUS

## 2016-06-12 MED ORDER — PROPOFOL 10 MG/ML IV BOLUS
INTRAVENOUS | Status: DC | PRN
  Administered 2016-06-12: 200 mg via INTRAVENOUS

## 2016-06-12 MED ORDER — OXYCODONE HCL 5 MG PO TABS: 5.0000 mg | ORAL_TABLET | ORAL | Status: DC | PRN

## 2016-06-12 MED ORDER — LIDOCAINE 2% (20 MG/ML) 5 ML SYRINGE
INTRAMUSCULAR | Status: AC
  Filled 2016-06-12: qty 10

## 2016-06-12 MED ORDER — ACETAMINOPHEN 650 MG RE SUPP
650.0000 mg | RECTAL | Status: DC | PRN
  Filled 2016-06-12: qty 1

## 2016-06-12 MED ORDER — LIDOCAINE HCL (CARDIAC) 20 MG/ML IV SOLN
INTRAVENOUS | Status: DC | PRN
  Administered 2016-06-12: 80 mg via INTRATRACHEAL

## 2016-06-12 MED ORDER — FENTANYL CITRATE (PF) 100 MCG/2ML IJ SOLN
INTRAMUSCULAR | Status: DC | PRN
  Administered 2016-06-12: 100 ug via INTRAVENOUS
  Administered 2016-06-12 (×3): 50 ug via INTRAVENOUS

## 2016-06-12 MED ORDER — ACETAMINOPHEN 325 MG PO TABS
650.0000 mg | ORAL_TABLET | ORAL | Status: DC | PRN
  Filled 2016-06-12: qty 2

## 2016-06-12 MED ORDER — CHLORHEXIDINE GLUCONATE CLOTH 2 % EX PADS: 6.0000 | MEDICATED_PAD | Freq: Once | CUTANEOUS | Status: DC

## 2016-06-12 MED ORDER — SODIUM CHLORIDE 0.9% FLUSH: 3.0000 mL | INTRAVENOUS | Status: DC | PRN

## 2016-06-12 MED ORDER — ONDANSETRON HCL 4 MG/2ML IJ SOLN
INTRAMUSCULAR | Status: AC
  Filled 2016-06-12: qty 4

## 2016-06-12 MED ORDER — DEXAMETHASONE SODIUM PHOSPHATE 10 MG/ML IJ SOLN
INTRAMUSCULAR | Status: DC | PRN
  Administered 2016-06-12: 10 mg via INTRAVENOUS

## 2016-06-12 MED ORDER — BUPIVACAINE HCL (PF) 0.25 % IJ SOLN
INTRAMUSCULAR | Status: AC
  Filled 2016-06-12: qty 30

## 2016-06-12 MED ORDER — HYDROMORPHONE HCL 1 MG/ML IJ SOLN
INTRAMUSCULAR | Status: AC
  Filled 2016-06-12: qty 0.5

## 2016-06-12 MED ORDER — OXYCODONE-ACETAMINOPHEN 5-325 MG PO TABS: 1.0000 | ORAL_TABLET | ORAL | 0 refills | Status: DC | PRN

## 2016-06-12 MED ORDER — MIDAZOLAM HCL 2 MG/2ML IJ SOLN
INTRAMUSCULAR | Status: AC
  Filled 2016-06-12: qty 2

## 2016-06-12 MED ORDER — LACTATED RINGERS IV SOLN
INTRAVENOUS | Status: DC
  Administered 2016-06-12: 14:00:00 via INTRAVENOUS

## 2016-06-12 MED ORDER — FENTANYL CITRATE (PF) 100 MCG/2ML IJ SOLN
INTRAMUSCULAR | Status: AC
  Filled 2016-06-12: qty 2

## 2016-06-12 MED ORDER — PROMETHAZINE HCL 25 MG/ML IJ SOLN: 6.2500 mg | INTRAMUSCULAR | Status: DC | PRN

## 2016-06-12 MED ORDER — KETOROLAC TROMETHAMINE 30 MG/ML IJ SOLN
INTRAMUSCULAR | Status: DC | PRN
  Administered 2016-06-12: 30 mg via INTRAVENOUS

## 2016-06-12 MED ORDER — PROPOFOL 10 MG/ML IV BOLUS
INTRAVENOUS | Status: AC
  Filled 2016-06-12: qty 20

## 2016-06-12 MED ORDER — SODIUM CHLORIDE 0.9% FLUSH: 3.0000 mL | Freq: Two times a day (BID) | INTRAVENOUS | Status: DC

## 2016-06-12 MED ORDER — ONDANSETRON HCL 4 MG/2ML IJ SOLN
INTRAMUSCULAR | Status: DC | PRN
  Administered 2016-06-12: 4 mg via INTRAVENOUS

## 2016-06-12 MED ORDER — CEFAZOLIN SODIUM-DEXTROSE 2-4 GM/100ML-% IV SOLN
2.0000 g | INTRAVENOUS | Status: AC
  Administered 2016-06-12: 2 g via INTRAVENOUS
  Filled 2016-06-12: qty 100

## 2016-06-12 MED ORDER — SODIUM CHLORIDE 0.9 % IV SOLN: 250.0000 mL | INTRAVENOUS | Status: DC | PRN

## 2016-06-12 MED ORDER — MIDAZOLAM HCL 5 MG/5ML IJ SOLN
INTRAMUSCULAR | Status: DC | PRN
  Administered 2016-06-12: 2 mg via INTRAVENOUS

## 2016-06-12 MED ORDER — 0.9 % SODIUM CHLORIDE (POUR BTL) OPTIME
TOPICAL | Status: DC | PRN
  Administered 2016-06-12: 1000 mL

## 2016-06-12 MED ORDER — BUPIVACAINE HCL 0.25 % IJ SOLN
INTRAMUSCULAR | Status: DC | PRN
Start: 2016-06-12 — End: 2016-06-12
  Administered 2016-06-12: 6 mL

## 2016-06-12 MED ORDER — MORPHINE SULFATE (PF) 2 MG/ML IV SOLN: 2.0000 mg | INTRAVENOUS | Status: DC | PRN

## 2016-06-12 MED ORDER — PHENYLEPHRINE HCL 10 MG/ML IJ SOLN
INTRAMUSCULAR | Status: DC | PRN
  Administered 2016-06-12 (×3): 80 ug via INTRAVENOUS

## 2016-06-12 MED ORDER — SUGAMMADEX SODIUM 200 MG/2ML IV SOLN
INTRAVENOUS | Status: DC | PRN
  Administered 2016-06-12: 200 mg via INTRAVENOUS

## 2016-06-12 MED ORDER — HYDROMORPHONE HCL 1 MG/ML IJ SOLN
0.2500 mg | INTRAMUSCULAR | Status: DC | PRN
  Administered 2016-06-12 (×2): 0.5 mg via INTRAVENOUS

## 2016-06-12 MED ORDER — SODIUM CHLORIDE 0.9 % IR SOLN
Status: DC | PRN
  Administered 2016-06-12: 1000 mL

## 2016-06-12 SURGICAL SUPPLY — 35 items
BENZOIN TINCTURE PRP APPL 2/3 (GAUZE/BANDAGES/DRESSINGS) ×3 IMPLANT
CANISTER SUCTION 2500CC (MISCELLANEOUS) ×3 IMPLANT
CHLORAPREP W/TINT 26ML (MISCELLANEOUS) ×3 IMPLANT
CLIP LIGATING HEMO O LOK GREEN (MISCELLANEOUS) ×3 IMPLANT
CLOSURE WOUND 1/2 X4 (GAUZE/BANDAGES/DRESSINGS) ×1
COVER SURGICAL LIGHT HANDLE (MISCELLANEOUS) ×3 IMPLANT
COVER TRANSDUCER ULTRASND (DRAPES) ×3 IMPLANT
DEVICE TROCAR PUNCTURE CLOSURE (ENDOMECHANICALS) ×3 IMPLANT
ELECT REM PT RETURN 9FT ADLT (ELECTROSURGICAL) ×3
ELECTRODE REM PT RTRN 9FT ADLT (ELECTROSURGICAL) ×1 IMPLANT
GAUZE SPONGE 2X2 8PLY STRL LF (GAUZE/BANDAGES/DRESSINGS) ×1 IMPLANT
GLOVE BIO SURGEON STRL SZ7.5 (GLOVE) ×3 IMPLANT
GOWN STRL REUS W/ TWL LRG LVL3 (GOWN DISPOSABLE) ×2 IMPLANT
GOWN STRL REUS W/ TWL XL LVL3 (GOWN DISPOSABLE) ×1 IMPLANT
GOWN STRL REUS W/TWL LRG LVL3 (GOWN DISPOSABLE) ×4
GOWN STRL REUS W/TWL XL LVL3 (GOWN DISPOSABLE) ×2
KIT BASIN OR (CUSTOM PROCEDURE TRAY) ×3 IMPLANT
KIT ROOM TURNOVER OR (KITS) ×3 IMPLANT
NEEDLE INSUFFLATION 14GA 120MM (NEEDLE) ×3 IMPLANT
NS IRRIG 1000ML POUR BTL (IV SOLUTION) ×3 IMPLANT
PAD ARMBOARD 7.5X6 YLW CONV (MISCELLANEOUS) ×6 IMPLANT
SCISSORS LAP 5X35 DISP (ENDOMECHANICALS) ×3 IMPLANT
SET IRRIG TUBING LAPAROSCOPIC (IRRIGATION / IRRIGATOR) ×3 IMPLANT
SLEEVE ENDOPATH XCEL 5M (ENDOMECHANICALS) ×6 IMPLANT
SPECIMEN JAR SMALL (MISCELLANEOUS) ×3 IMPLANT
SPONGE GAUZE 2X2 STER 10/PKG (GAUZE/BANDAGES/DRESSINGS) ×2
STRIP CLOSURE SKIN 1/2X4 (GAUZE/BANDAGES/DRESSINGS) ×2 IMPLANT
SUT MNCRL AB 3-0 PS2 18 (SUTURE) ×3 IMPLANT
TAPE CLOTH SURG 4X10 WHT LF (GAUZE/BANDAGES/DRESSINGS) ×3 IMPLANT
TOWEL OR 17X24 6PK STRL BLUE (TOWEL DISPOSABLE) ×3 IMPLANT
TOWEL OR 17X26 10 PK STRL BLUE (TOWEL DISPOSABLE) ×3 IMPLANT
TRAY LAPAROSCOPIC MC (CUSTOM PROCEDURE TRAY) ×3 IMPLANT
TROCAR XCEL NON-BLD 11X100MML (ENDOMECHANICALS) ×3 IMPLANT
TROCAR XCEL NON-BLD 5MMX100MML (ENDOMECHANICALS) ×3 IMPLANT
TUBING INSUFFLATION (TUBING) ×3 IMPLANT

## 2016-06-12 NOTE — Op Note (Signed)
06/12/2016  3:29 PM  PATIENT:  Jennifer Mcmahon  35 y.o. female  PRE-OPERATIVE DIAGNOSIS:  GALLSTONES  POST-OPERATIVE DIAGNOSIS:  GALLSTONES  PROCEDURE:  Procedure(s): LAPAROSCOPIC CHOLECYSTECTOMY (N/A)  SURGEON:  Surgeon(s) and Role:    * Axel FillerArmando Wonda Goodgame, MD - Primary  ANESTHESIA:   local and general  EBL:  Total I/O In: -  Out: 50 [Blood:10cc]  BLOOD ADMINISTERED:none  DRAINS: none   LOCAL MEDICATIONS USED:  BUPIVICAINE   SPECIMEN:  Source of Specimen:  gallbladder  DISPOSITION OF SPECIMEN:  PATHOLOGY  COUNTS:  YES  TOURNIQUET:  * No tourniquets in log *  DICTATION: .Dragon Dictation The patient was taken to the operating and placed in the supine position with bilateral SCDs in place. The patient was prepped and draped in the usual sterile fashion. A time out was called and all facts were verified. A pneumoperitoneum was obtained via A Veress needle technique to a pressure of 14mm of mercury.  A 5mm trochar was then placed in the right upper quadrant under visualization, and there were no injuries to any abdominal organs. A 11 mm port was then placed in the umbilical region after infiltrating with local anesthesia under direct visualization. A second and third epigastric port and right lower quadrant port placement under direct visualization, respectively.   The gallbladder was identified and retracted, the peritoneum was then sharply dissected from the gallbladder and this dissection was carried down to Calot's triangle. The gallbladder was identified and stripped away circumferentially and seen going into the gallbladder 360, the critical angle was obtained.  2 clips were placed proximally one distally and the cystic duct transected. The cystic artery was identified and 2 clips placed proximally and one distally and transected. We then proceeded to remove the gallbladder off the hepatic fossa with Bovie cautery. A retrieval bag was then placed in the abdomen and  gallbladder placed in the bag. The hepatic fossa was then reexamined and hemostasis was achieved with Bovie cautery and was excellent at the end of the case. The subhepatic fossa and perihepatic fossa was then irrigated until the effluent was clear.  The gallbladder and bag were removed from the abdominal cavity. The 11 mm trocar fascia was reapproximated with the Endo Close #1 Vicryl x2. The pneumoperitoneum was evacuated and all trochars removed under direct visulalization. The skin was then closed with 4-0 Monocryl and the skin dressed with Steri-Strips, gauze, and tape. The patient was awaken from general anesthesia and taken to the recovery room in stable condition.    PLAN OF CARE: Discharge to home after PACU  PATIENT DISPOSITION:  PACU - hemodynamically stable.   Delay start of Pharmacological VTE agent (>24hrs) due to surgical blood loss or risk of bleeding: not applicable

## 2016-06-12 NOTE — Anesthesia Procedure Notes (Signed)
Procedure Name: Intubation Date/Time: 06/12/2016 2:39 PM Performed by: Marena ChancyBECKNER, Lateef Juncaj S Pre-anesthesia Checklist: Patient identified, Emergency Drugs available, Suction available and Patient being monitored Patient Re-evaluated:Patient Re-evaluated prior to inductionOxygen Delivery Method: Circle System Utilized Preoxygenation: Pre-oxygenation with 100% oxygen Intubation Type: IV induction Ventilation: Mask ventilation without difficulty Laryngoscope Size: Mac and 3 Tube type: Oral Tube size: 7.0 mm Number of attempts: 1 Airway Equipment and Method: Stylet and Oral airway Placement Confirmation: ETT inserted through vocal cords under direct vision,  positive ETCO2 and breath sounds checked- equal and bilateral Secured at: 22 cm Tube secured with: Tape Dental Injury: Teeth and Oropharynx as per pre-operative assessment

## 2016-06-12 NOTE — Progress Notes (Signed)
Patient's blood pressure remains elevated despite adequate pain control.  Dr. Desmond Lopeurk was notified.  BP rechecked 10 minutes later without change. BP is at patient's baseline prior to surgery this morning.  Per anesthesia, patient is okay to discharge home.

## 2016-06-12 NOTE — H&P (View-Only) (Signed)
History of Present Illness Jennifer Mcmahon(Jennifer Weinreb MD; 05/27/2016 9:11 AM) Patient words: GB.  The patient is a 35 year old female who presents for evaluation of gall stones. The patient is a 35 y/o F who is referred by Dr Jennifer Mcmahon for an evaluation of sx gallstones. Patient states that she has right upper quadrant pain that began in July of this past year. She states that the pain has been ongoing recurrent. She states it usually 2 hours after eating. She states usually with some type of meat product. She states that she is nauseated however has no emesis.  Patient on ultrasound of her abdomen which revealed: IMPRESSION: Multiple gallstones within a contracted gallbladder. No sonographic evidence of acute cholecystitis.  Heterogeneous hepatic echotexture likely reflects fatty infiltrative change. Normal common bile duct.     Other Problems (Jennifer Eversole, LPN; 16/10/960410/25/2017 8:29 AM) Anxiety Disorder Cholelithiasis Depression Diabetes Mellitus High blood pressure Pulmonary Embolism / Blood Clot in Legs Thyroid Disease  Past Surgical History (Jennifer Eversole, LPN; 54/09/811910/25/2017 8:29 AM) Oral Surgery  Diagnostic Studies History (Jennifer Eversole, LPN; 14/78/295610/25/2017 8:29 AM) Colonoscopy never Mammogram never Pap Smear 1-5 years ago  Allergies (Jennifer Eversole, LPN; 21/30/865710/25/2017 8:32 AM) Ramipril *ANTIHYPERTENSIVES*  Medication History (Jennifer Eversole, LPN; 84/69/629510/25/2017 8:32 AM) Levothyroxine Sodium (100MCG Tablet, Oral) Active. Medications Reconciled  Social History (Jennifer Eversole, LPN; 28/41/324410/25/2017 8:29 AM) Alcohol use Occasional alcohol use. Caffeine use Carbonated beverages, Coffee. No drug use Tobacco use Former smoker.  Family History Jennifer Mcmahon(Jennifer Eversole, LPN; 01/02/725310/25/2017 8:29 AM) Arthritis Father. Breast Cancer Family Members In General. Cancer Mother. Cervical Cancer Mother. Depression Mother. Heart disease in female family member before age 37 Hypertension  Mother. Melanoma Mother. Migraine Headache Father. Ovarian Cancer Mother. Thyroid problems Mother.  Pregnancy / Birth History Jennifer Mcmahon(Jennifer Eversole, LPN; 66/44/034710/25/2017 8:29 AM) Age at menarche 11 years. Gravida 2 Length (months) of breastfeeding 12-24 Maternal age 37-30 Para 2 Regular periods    Review of Systems (Jennifer Eversole LPN; 42/59/563810/25/2017 8:29 AM) General Present- Fatigue. Not Present- Appetite Loss, Chills, Fever, Night Sweats, Weight Gain and Weight Loss. Skin Present- Dryness. Not Present- Change in Wart/Mole, Hives, Jaundice, New Lesions, Non-Healing Wounds, Rash and Ulcer. HEENT Present- Seasonal Allergies, Sinus Pain and Wears glasses/contact lenses. Not Present- Earache, Hearing Loss, Hoarseness, Nose Bleed, Oral Ulcers, Ringing in the Ears, Sore Throat, Visual Disturbances and Yellow Eyes. Respiratory Not Present- Bloody sputum, Chronic Cough, Difficulty Breathing, Snoring and Wheezing. Breast Not Present- Breast Mass, Breast Pain, Nipple Discharge and Skin Changes. Cardiovascular Not Present- Chest Pain, Difficulty Breathing Lying Down, Leg Cramps, Palpitations, Rapid Heart Rate, Shortness of Breath and Swelling of Extremities. Gastrointestinal Present- Abdominal Pain, Bloating and Nausea. Not Present- Bloody Stool, Change in Bowel Habits, Chronic diarrhea, Constipation, Difficulty Swallowing, Excessive gas, Gets full quickly at meals, Hemorrhoids, Indigestion, Rectal Pain and Vomiting. Female Genitourinary Not Present- Frequency, Nocturia, Painful Urination, Pelvic Pain and Urgency. Musculoskeletal Present- Back Pain and Joint Pain. Not Present- Joint Stiffness, Muscle Pain, Muscle Weakness and Swelling of Extremities. Neurological Not Present- Decreased Memory, Fainting, Headaches, Numbness, Seizures, Tingling, Tremor, Trouble walking and Weakness. Psychiatric Not Present- Anxiety, Bipolar, Change in Sleep Pattern, Depression, Fearful and Frequent crying. Endocrine  Present- Cold Intolerance. Not Present- Excessive Hunger, Hair Changes, Heat Intolerance, Hot flashes and New Diabetes. Hematology Not Present- Blood Thinners, Easy Bruising, Excessive bleeding, Gland problems, HIV and Persistent Infections.  Vitals (Jennifer Eversole LPN; 75/64/332910/25/2017 8:31 AM) 05/27/2016 8:31 AM Weight: 213.8 lb Height: 66in Body Surface Area: 2.06 m Body Mass Index: 34.51  kg/m  Temp.: 98.3F(Oral)  Pulse: 99 (Regular)  BP: 142/80 (Sitting, Left Arm, Standard)       Physical Exam (Jennifer Harlan, MD; 05/27/2016 9:11 AM) General Mental Status-Alert. General Appearance-Consistent with stated age. Hydration-Well hydrated. Voice-Normal.  Head and Neck Head-normocephalic, atraumatic with no lesions or palpable masses.  Eye Eyeball - Bilateral-Extraocular movements intact. Sclera/Conjunctiva - Bilateral-No scleral icterus.  Chest and Lung Exam Chest and lung exam reveals -quiet, even and easy respiratory effort with no use of accessory muscles. Inspection Chest Wall - Normal. Back - normal.  Cardiovascular Cardiovascular examination reveals -normal heart sounds, regular rate and rhythm with no murmurs.  Abdomen Inspection Normal Exam - No Hernias. Palpation/Percussion Normal exam - Soft, Non Tender, No Rebound tenderness, No Rigidity (guarding) and No hepatosplenomegaly. Auscultation Normal exam - Bowel sounds normal.  Neurologic Neurologic evaluation reveals -alert and oriented x 3 with no impairment of recent or remote memory. Mental Status-Normal.  Musculoskeletal Normal Exam - Left-Upper Extremity Strength Normal and Lower Extremity Strength Normal. Normal Exam - Right-Upper Extremity Strength Normal, Lower Extremity Weakness.    Assessment & Plan (Jennifer Sliger MD; 05/27/2016 9:11 AM) SYMPTOMATIC CHOLELITHIASIS (K80.20) Impression: 35-year-old female with significant cholelithiasis  1. We will proceed  to the operating room for a laparoscopic cholecystectomy 2. Risks and benefits were discussed with the patient to generally include, but not limited to: infection, bleeding, possible need for post op ERCP, damage to the bile ducts, bile leak, and possible need for further surgery. Alternatives were offered and described. All questions were answered and the patient voiced understanding of the procedure and wishes to proceed at this point with a laparoscopic cholecystectomy 

## 2016-06-12 NOTE — Anesthesia Preprocedure Evaluation (Addendum)
Anesthesia Evaluation  Patient identified by MRN, date of birth, ID band Patient awake    Reviewed: Allergy & Precautions, NPO status , Patient's Chart, lab work & pertinent test results  Airway Mallampati: II  TM Distance: >3 FB Neck ROM: Full    Dental  (+) Dental Advisory Given   Pulmonary former smoker,    breath sounds clear to auscultation       Cardiovascular hypertension, negative cardio ROS   Rhythm:Regular Rate:Normal  Hx PE   Neuro/Psych Anxiety Depression negative neurological ROS     GI/Hepatic negative GI ROS, Neg liver ROS,   Endo/Other  diabetesHypothyroidism   Renal/GU negative Renal ROS     Musculoskeletal   Abdominal   Peds  Hematology negative hematology ROS (+)   Anesthesia Other Findings   Reproductive/Obstetrics                            Lab Results  Component Value Date   WBC 8.0 04/12/2015   HGB 13.9 04/12/2015   HCT 41.0 04/12/2015   MCV 87.6 04/12/2015   PLT 218 04/12/2015   Lab Results  Component Value Date   CREATININE 0.92 04/30/2016   BUN 14 04/30/2016   NA 139 04/30/2016   K 4.2 04/30/2016   CL 105 04/30/2016   CO2 28 04/30/2016    Anesthesia Physical Anesthesia Plan  ASA: II  Anesthesia Plan: General   Post-op Pain Management:    Induction: Intravenous  Airway Management Planned: Oral ETT  Additional Equipment:   Intra-op Plan:   Post-operative Plan: Extubation in OR  Informed Consent: I have reviewed the patients History and Physical, chart, labs and discussed the procedure including the risks, benefits and alternatives for the proposed anesthesia with the patient or authorized representative who has indicated his/her understanding and acceptance.   Dental advisory given  Plan Discussed with: CRNA  Anesthesia Plan Comments:         Anesthesia Quick Evaluation

## 2016-06-12 NOTE — Transfer of Care (Signed)
Immediate Anesthesia Transfer of Care Note  Patient: Jennifer Mcmahon  Procedure(s) Performed: Procedure(s): LAPAROSCOPIC CHOLECYSTECTOMY (N/A)  Patient Location: PACU  Anesthesia Type:General  Level of Consciousness: awake, alert , oriented and patient cooperative  Airway & Oxygen Therapy: Patient Spontanous Breathing and Patient connected to nasal cannula oxygen  Post-op Assessment: Report given to RN and Post -op Vital signs reviewed and stable  Post vital signs: Reviewed and stable  Last Vitals:  Vitals:   06/12/16 1327 06/12/16 1548  BP: (!) 158/113   Pulse: 92   Resp: 20   Temp: 36.8 C (P) 36.2 C    Last Pain:  Vitals:   06/12/16 1335  TempSrc:   PainSc: 1       Patients Stated Pain Goal: 1 (06/12/16 1335)  Complications: No apparent anesthesia complications

## 2016-06-12 NOTE — Interval H&P Note (Signed)
History and Physical Interval Note:  06/12/2016 1:52 PM  Jennifer Mcmahon  has presented today for surgery, with the diagnosis of GALLSTONES  The various methods of treatment have been discussed with the patient and family. After consideration of risks, benefits and other options for treatment, the patient has consented to  Procedure(s): LAPAROSCOPIC CHOLECYSTECTOMY (N/A) as a surgical intervention .  The patient's history has been reviewed, patient examined, no change in status, stable for surgery.  I have reviewed the patient's chart and labs.  Questions were answered to the patient's satisfaction.     Marigene Ehlersamirez Jr., Jed LimerickArmando

## 2016-06-12 NOTE — Anesthesia Postprocedure Evaluation (Signed)
Anesthesia Post Note  Patient: Jennifer Cradleeresa Lea Mcmahon  Procedure(s) Performed: Procedure(s) (LRB): LAPAROSCOPIC CHOLECYSTECTOMY (N/A)  Patient location during evaluation: PACU Anesthesia Type: General Level of consciousness: awake and alert Pain management: pain level controlled Vital Signs Assessment: post-procedure vital signs reviewed and stable Respiratory status: spontaneous breathing, nonlabored ventilation, respiratory function stable and patient connected to nasal cannula oxygen Cardiovascular status: blood pressure returned to baseline and stable Postop Assessment: no signs of nausea or vomiting Anesthetic complications: no    Last Vitals:  Vitals:   06/12/16 1703 06/12/16 1714  BP: (!) 164/105 (!) 163/100  Pulse: 86 86  Resp: 14 17  Temp:  36.4 C    Last Pain:  Vitals:   06/12/16 1648  TempSrc:   PainSc: 3                  [name], Galo Sayed E

## 2016-06-12 NOTE — Discharge Instructions (Signed)
[facility] ______[facility] °LAPAROSCOPIC SURGERY: POST OP INSTRUCTIONS °Always review your discharge instruction sheet given to you by the facility where your surgery was performed. °IF YOU HAVE DISABILITY OR FAMILY LEAVE FORMS, YOU MUST BRING THEM TO THE OFFICE FOR PROCESSING.   °DO NOT GIVE THEM TO YOUR DOCTOR. ° °1. A prescription for pain medication may be given to you upon discharge.  Take your pain medication as prescribed, if needed.  If narcotic pain medicine is not needed, then you may take acetaminophen (Tylenol) or ibuprofen (Advil) as needed. °2. Take your usually prescribed medications unless otherwise directed. °3. If you need a refill on your pain medication, please contact your pharmacy.  They will contact our office to request authorization. Prescriptions will not be filled after 5pm or on week-ends. °4. You should follow a light diet the first few days after arrival home, such as soup and crackers, etc.  Be sure to include lots of fluids daily. °5. Most patients will experience some swelling and bruising in the area of the incisions.  Ice packs will help.  Swelling and bruising can take several days to resolve.  °6. It is common to experience some constipation if taking pain medication after surgery.  Increasing fluid intake and taking a stool softener (such as Colace) will usually help or prevent this problem from occurring.  A mild laxative (Milk of Magnesia or Miralax) should be taken according to package instructions if there are no bowel movements after 48 hours. °7. Unless discharge instructions indicate otherwise, you may remove your bandages 24-48 hours after surgery, and you may shower at that time.  You may have steri-strips (small skin tapes) in place directly over the incision.  These strips should be left on the skin for 7-10 days.  If your surgeon used skin glue on the incision, you may shower in 24 hours.  The glue will flake off over the next 2-3 weeks.  Any sutures or  staples will be removed at the office during your follow-up visit. °8. ACTIVITIES:  You may resume regular (light) daily activities beginning the next day--such as daily self-care, walking, climbing stairs--gradually increasing activities as tolerated.  You may have sexual intercourse when it is comfortable.  Refrain from any heavy lifting or straining until approved by your doctor. °a. You may drive when you are no longer taking prescription pain medication, you can comfortably wear a seatbelt, and you can safely maneuver your car and apply brakes. °b. RETURN TO WORK:  __________________________________________________________ °9. You should see your doctor in the office for a follow-up appointment approximately 2-3 weeks after your surgery.  Make sure that you call for this appointment within a day or two after you arrive home to insure a convenient appointment time. °10. OTHER INSTRUCTIONS: __________________________________________________________________________________________________________________________ __________________________________________________________________________________________________________________________ °WHEN TO CALL YOUR DOCTOR: °1. Fever over 101.0 °2. Inability to urinate °3. Continued bleeding from incision. °4. Increased pain, redness, or drainage from the incision. °5. Increasing abdominal pain ° °The clinic staff is available to answer your questions during regular business hours.  Please don’t hesitate to call and ask to speak to one of the nurses for clinical concerns.  If you have a medical emergency, go to the nearest emergency room or call 911.  A surgeon from [facility] is always on call at the hospital. °[address], [address], [place], [place]  [address] ? [address] [address], [place], [place]   [address] °[phone] ? [phone] ? FAX [phone] °Web site:   [email or web address] °

## 2016-06-13 ENCOUNTER — Encounter (HOSPITAL_COMMUNITY): Payer: Self-pay | Admitting: General Surgery

## 2016-08-05 ENCOUNTER — Encounter: Payer: Self-pay | Admitting: Family Medicine

## 2016-08-05 ENCOUNTER — Ambulatory Visit (INDEPENDENT_AMBULATORY_CARE_PROVIDER_SITE_OTHER): Payer: BC Managed Care – PPO | Admitting: Family Medicine

## 2016-08-05 VITALS — BP 120/80 | HR 97 | Resp 12 | Ht 66.0 in | Wt 206.0 lb

## 2016-08-05 DIAGNOSIS — Z6833 Body mass index (BMI) 33.0-33.9, adult: Secondary | ICD-10-CM | POA: Diagnosis not present

## 2016-08-05 DIAGNOSIS — I10 Essential (primary) hypertension: Secondary | ICD-10-CM | POA: Diagnosis not present

## 2016-08-05 DIAGNOSIS — E039 Hypothyroidism, unspecified: Secondary | ICD-10-CM | POA: Diagnosis not present

## 2016-08-05 MED ORDER — LEVOTHYROXINE SODIUM 100 MCG PO TABS: 100.0000 ug | ORAL_TABLET | Freq: Every day | ORAL | 3 refills | Status: DC

## 2016-08-05 NOTE — Progress Notes (Signed)
Ms. Paulita Cradleeresa Lea Lagares is a 36 y.o.female, who is here today to follow on last OV. She was last seen on 04/30/16, when her BP was mildly elevated, she did not want pharmacologic treatment. Hx of HTN   She has not noted unusual headache, visual changes, exertional chest pain, dyspnea,  focal weakness, or edema.    Lab Results  Component Value Date   CREATININE 0.75 06/12/2016   BUN 12 06/12/2016   NA 138 06/12/2016   K 4.0 06/12/2016   CL 108 06/12/2016   CO2 21 (L) 06/12/2016    She is not checking BP at home.  She has tried to eat healthier. Decreased red meet intake and snacking at night, she has noted that clothes fit better.  Since her last OV she had cholecystectomy, 06/12/16.  Hypothyroidism:  Currently she is on Levothyroxine .   Lab Results  Component Value Date   TSH 1.31 04/30/2016      Review of Systems  Constitutional: Negative for activity change, appetite change, fatigue, fever and unexpected weight change.  HENT: Negative for mouth sores, nosebleeds and trouble swallowing.   Eyes: Negative for pain and visual disturbance.  Respiratory: Negative for cough, shortness of breath and wheezing.   Cardiovascular: Negative for chest pain, palpitations and leg swelling.  Gastrointestinal: Negative for abdominal pain, nausea and vomiting.       Negative for changes in bowel habits.  Genitourinary: Negative for decreased urine volume, difficulty urinating and hematuria.  Musculoskeletal: Negative for back pain and myalgias.  Neurological: Negative for syncope, weakness, numbness and headaches.     Current Outpatient Prescriptions on File Prior to Visit  Medication Sig Dispense Refill  . OVER THE COUNTER MEDICATION Take 1 tablet by mouth daily as needed. Sleep aid     No current facility-administered medications on file prior to visit.      Past Medical History:  Diagnosis Date  . Anemia   . Anxiety    postpartum  . Depression    post partum    . Gestational diabetes    diet controlled  . Hypercalcemia   . Hyperlipidemia   . Hypertension    not on high blood pressure meds anymore  . Hypothyroidism   . PE (pulmonary embolism) 2004  . Postpartum care following vaginal delivery (11/15) 06/18/2014  . Thyroid disease    hypothyroidism  . Vitamin D deficiency     Allergies  Allergen Reactions  . Ramipril Cough    Social History   Social History  . Marital status: Married    Spouse name: N/A  . Number of children: N/A  . Years of education: N/A   Social History Main Topics  . Smoking status: Former Smoker    Types: Cigarettes    Quit date: 02/25/2008  . Smokeless tobacco: Never Used  . Alcohol use Yes     Comment: social  . Drug use: No  . Sexual activity: Yes    Birth control/ protection: None, Condom   Other Topics Concern  . None   Social History Narrative  . None    Vitals:   08/05/16 1042  BP: 120/80  Pulse: 97  Resp: 12   Body mass index is 33.25 kg/m.  Wt Readings from Last 3 Encounters:  08/05/16 206 lb (93.4 kg)  06/12/16 211 lb (95.7 kg)  04/30/16 211 lb 8 oz (95.9 kg)    Physical Exam  Nursing note and vitals reviewed. Constitutional: She is oriented to person,  place, and time. She appears well-developed. No distress.  HENT:  Head: Atraumatic.  Mouth/Throat: Oropharynx is clear and moist and mucous membranes are normal.  Eyes: Conjunctivae and EOM are normal. Pupils are equal, round, and reactive to light.  Cardiovascular: Normal rate and regular rhythm.   No murmur heard. Respiratory: Effort normal and breath sounds normal. No respiratory distress.  GI: Soft. She exhibits no mass. There is no hepatomegaly. There is no tenderness.  Musculoskeletal: She exhibits no edema or tenderness.  Neurological: She is alert and oriented to person, place, and time. She has normal strength. Coordination and gait normal.  Skin: Skin is warm. No erythema.  Psychiatric: She has a normal mood and  affect.  Well groomed, good eye contact.     ASSESSMENT AND PLAN:   Tabby was seen today for follow-up.  Diagnoses and all orders for this visit:    BMI 33.0-33.9,adult  She has lost about 5 pounds since her last OV. We discussed benefits of wt loss as well as adverse effects of obesity. Consistency with healthy diet and physical activity recommended.   Essential hypertension  Adequately controlled with non pharmacologic treatment. Monitor BP at home. Continue healthy low salt diet and wt loss.   Hypothyroidism, unspecified type  Well controlled. No changes in current management. F/U in 12 months.  -     levothyroxine (SYNTHROID, LEVOTHROID) 100 MCG tablet; Take 1 tablet (100 mcg total) by mouth daily before breakfast.     -Ms. Oaklee Esther was advised to return sooner than planned today if new concerns arise.     Sherill Mangen G. [place], MD  [facility]. [place] office.

## 2016-08-05 NOTE — Progress Notes (Signed)
Pre visit review using our clinic review tool, if applicable. No additional management support is needed unless otherwise documented below in the visit note. 

## 2016-08-05 NOTE — Patient Instructions (Addendum)
A few things to remember from today's visit:   BMI 33.0-33.9,adult     [name], today we have followed on some of your chronic medical problems and they seem to be stable, so no changes in current management today.  Review medication list and be sure if is accurate.  -Remember a healthy diet and regular physical activity are very important for prevention as well as for well being; they also help with many chronic problems, decreasing the need of adding new medications and delaying or preventing possible complications.  Remember to arrange your follow up appt before leaving today. Please follow sooner than planned if a new concern arises.

## 2017-01-01 ENCOUNTER — Encounter: Payer: Self-pay | Admitting: Family Medicine

## 2017-01-01 ENCOUNTER — Ambulatory Visit (INDEPENDENT_AMBULATORY_CARE_PROVIDER_SITE_OTHER): Payer: BC Managed Care – PPO | Admitting: Family Medicine

## 2017-01-01 VITALS — BP 118/80 | HR 76 | Temp 98.2°F | Resp 12 | Ht 66.0 in | Wt 214.2 lb

## 2017-01-01 DIAGNOSIS — R110 Nausea: Secondary | ICD-10-CM | POA: Diagnosis not present

## 2017-01-01 DIAGNOSIS — J309 Allergic rhinitis, unspecified: Secondary | ICD-10-CM

## 2017-01-01 DIAGNOSIS — J0100 Acute maxillary sinusitis, unspecified: Secondary | ICD-10-CM | POA: Diagnosis not present

## 2017-01-01 MED ORDER — ONDANSETRON HCL 4 MG PO TABS: 4.0000 mg | ORAL_TABLET | Freq: Two times a day (BID) | ORAL | 0 refills | Status: AC | PRN

## 2017-01-01 MED ORDER — FLUTICASONE PROPIONATE 50 MCG/ACT NA SUSP: 1.0000 | Freq: Two times a day (BID) | NASAL | 3 refills | Status: DC

## 2017-01-01 MED ORDER — AMOXICILLIN-POT CLAVULANATE 875-125 MG PO TABS: 1.0000 | ORAL_TABLET | Freq: Two times a day (BID) | ORAL | 0 refills | Status: AC

## 2017-01-01 NOTE — Progress Notes (Signed)
HPI:   ACUTE VISIT:  Chief Complaint  Patient presents with  . sinus pressure    Ms.Paulita Cradleeresa Lea Dethloff is a 36 y.o. female, who is here today complaining of over 2 weeks of right fronto-temporal headache,pressure like, and right facial pain. Constant pain. No exacerbating factors identified. She has tried Benadryl and Claritin, helped some.  She denies associated visual changes or focal deficit. She has history of allergic rhinitis, she has not used intranasal steroid medication. No recent URI. She denies fever,chills, body aches,or sore throat.  + Nasal congestion and rhinorrhea.  Sinus Problem  This is a new problem. The current episode started 1 to 4 weeks ago. The problem has been gradually worsening since onset. There has been no fever. Her pain is at a severity of 5/10. The pain is moderate. Associated symptoms include congestion and headaches. Pertinent negatives include no chills, coughing, ear pain, hoarse voice, neck pain, shortness of breath, sneezing, sore throat or swollen glands. Past treatments include oral decongestants. The treatment provided no relief.    She is also concerned about being "extremely" nauseated for over 2 weeks.She is not sure if this is associated with above problem. S/P cholecystectomy. Exacerbated by food intake. No alleviating factors identified. + Mild dizziness.  Denies abdominal pain, vomiting, changes in bowel habits. LMP 3 weeks ago. She denies sexual intercourse for the past 2 months.   Review of Systems  Constitutional: Negative for activity change, appetite change, chills, fatigue and fever.  HENT: Positive for congestion, nosebleeds (right nostril), postnasal drip and sinus pain. Negative for ear pain, facial swelling, hoarse voice, mouth sores, sneezing, sore throat, trouble swallowing and voice change.   Eyes: Negative for discharge and redness.  Respiratory: Negative for cough, chest tightness, shortness of breath and  wheezing.   Cardiovascular: Negative for palpitations and leg swelling.  Gastrointestinal: Positive for nausea. Negative for abdominal pain, diarrhea and vomiting.  Musculoskeletal: Negative for gait problem, myalgias and neck pain.  Skin: Negative for pallor and rash.  Allergic/Immunologic: Positive for environmental allergies.  Neurological: Positive for dizziness and headaches. Negative for syncope, weakness and numbness.  Hematological: Negative for adenopathy. Does not bruise/bleed easily.  Psychiatric/Behavioral: Negative for confusion. The patient is nervous/anxious.       Current Outpatient Prescriptions on File Prior to Visit  Medication Sig Dispense Refill  . levothyroxine (SYNTHROID, LEVOTHROID) 100 MCG tablet Take 1 tablet (100 mcg total) by mouth daily before breakfast. 90 tablet 3  . OVER THE COUNTER MEDICATION Take 1 tablet by mouth daily as needed. Sleep aid     No current facility-administered medications on file prior to visit.      Past Medical History:  Diagnosis Date  . Anemia   . Anxiety    postpartum  . Depression    post partum  . Gestational diabetes    diet controlled  . Hypercalcemia   . Hyperlipidemia   . Hypertension    not on high blood pressure meds anymore  . Hypothyroidism   . PE (pulmonary embolism) 2004  . Postpartum care following vaginal delivery (11/15) 06/18/2014  . Thyroid disease    hypothyroidism  . Vitamin D deficiency    Allergies  Allergen Reactions  . Ramipril Cough    Social History   Social History  . Marital status: Married    Spouse name: N/A  . Number of children: N/A  . Years of education: N/A   Social History Main Topics  . Smoking status: Former  Smoker    Types: Cigarettes    Quit date: 02/25/2008  . Smokeless tobacco: Never Used  . Alcohol use Yes     Comment: social  . Drug use: No  . Sexual activity: Yes    Birth control/ protection: None, Condom   Other Topics Concern  . None   Social History  Narrative  . None    Vitals:   01/01/17 0924  BP: 118/80  Pulse: 76  Resp: 12  Temp: 98.2 F (36.8 C)  O2 sat at RA 98% Body mass index is 34.58 kg/m.   Physical Exam  Nursing note and vitals reviewed. Constitutional: She is oriented to person, place, and time. She appears well-developed. She does not appear ill. No distress.  HENT:  Head: Atraumatic.  Right Ear: Tympanic membrane, external ear and ear canal normal.  Left Ear: External ear and ear canal normal. Tympanic membrane is not erythematous. A middle ear effusion is present.  Nose: No rhinorrhea. No epistaxis. Right sinus exhibits maxillary sinus tenderness and frontal sinus tenderness (mild). Left sinus exhibits no maxillary sinus tenderness and no frontal sinus tenderness.  Mouth/Throat: Oropharynx is clear and moist and mucous membranes are normal.  Eyes: Conjunctivae and EOM are normal. Pupils are equal, round, and reactive to light.  Neck: Neck supple. No muscular tenderness present.  Cardiovascular: Normal rate and regular rhythm.   No murmur heard. Respiratory: Effort normal and breath sounds normal. No respiratory distress.  GI: Soft. She exhibits no mass. There is no hepatomegaly. There is no tenderness.  Musculoskeletal: She exhibits no edema or tenderness.  Lymphadenopathy:    She has no cervical adenopathy.  Neurological: She is alert and oriented to person, place, and time. She has normal strength. Gait normal.  Skin: Skin is warm. No rash noted. No erythema.  Psychiatric: Her speech is normal. Her mood appears anxious.  Well groomed, good eye contact.    ASSESSMENT AND PLAN:    Anitta was seen today for sinus pressure.  Diagnoses and all orders for this visit:  Acute non-recurrent maxillary sinusitis  We discussed some side effects of antibiotic. If he doesn't improve we need to consider sinus CT. Flonase nasal spray may also help. Follow-up as needed.   -     amoxicillin-clavulanate  (AUGMENTIN) 875-125 MG tablet; Take 1 tablet by mouth 2 (two) times daily.  Nausea without vomiting  It could be related to URI symptoms. We discussed other possible etiologies, GI examination today otherwise negative. Symptomatic treatment with Zofran recommended. Instructed about warning signs. He might be aggravated by oral antibiotics, so take medication with food.  Follow-up as needed.  -     ondansetron (ZOFRAN) 4 MG tablet; Take 1 tablet (4 mg total) by mouth every 12 (twelve) hours as needed for nausea or vomiting.  Allergic rhinitis, unspecified seasonality, unspecified trigger  Educated about diagnosis and treatment options. Continue Claritin daily. Flonase nasal spray daily for a couple weeks then as needed. Nasal irrigations with saline during the day as needed.  -     fluticasone (FLONASE) 50 MCG/ACT nasal spray; Place 1 spray into both nostrils 2 (two) times daily.     -Ms.Chetara Kropp Kropf advised to seek immediate medical attention is symptoms suddenly get worse or to follow if symptoms persist or new concerns arise.     Betty G. [place], MD  [facility]. [place] office.

## 2017-01-01 NOTE — Patient Instructions (Signed)
A few things to remember from today's visit:   Acute non-recurrent maxillary sinusitis - Plan: amoxicillin-clavulanate (AUGMENTIN) 875-125 MG tablet  Nausea without vomiting - Plan: ondansetron (ZOFRAN) 4 MG tablet  Allergic rhinitis, unspecified seasonality, unspecified trigger - Plan: fluticasone (FLONASE) 50 MCG/ACT nasal spray  If nausea does not resolved please follow. If still facial pain we may need CT of sinuses.    There are 2 forms of allergic rhinitis: . Seasonal (hay fever): Caused by an allergy to pollen and/or mold spores in the air. Pollen is the fine powder that comes from the stamen of flowering plants. It can be carried through the air and is easily inhaled. Symptoms are seasonal and usually occur in spring, late summer, and fall. Marland Kitchen. Perennial: Caused by other allergens such as dust mites, pet hair or dander, or mold. Symptoms occur year-round.  Symptoms: Your symptoms can vary, depending on the severity of your allergies. Symptoms can include: Sneezing, coughing.itching (mostly eyes, nose, mouth, throat and skin),runny nose,stuffy nose.headache,pressure in the nose and cheeks,ear fullness and popping, sore throat.watery, red, or swollen eyes,dark circles under your eyes,trouble smelling, and sometimes hives.  Allergic rhinitis cannot be prevented. You can help your symptoms by avoiding the things that you are allergic, including: . Keeping windows closed. This is especially important during high-pollen seasons. . Washing your hands after petting animals. . Using dust- and mite-proof bedding and mattress covers. . Wearing glasses outside to protect your eyes. . Showering before bed to wash off allergens from hair and skin. You can also avoid things that can make your symptoms worse, such as: . aerosol sprays . air pollution . cold temperatures . humidity . irritating fumes . tobacco smoke . wind . wood smoke.   Antihistamines help reduce the sneezing, runny  nose, and itchiness of allergies. These come in pill form and as nasal sprays. Allegra,Zyrtec,or Claritin are some examples. Decongestants, such as pseudoephedrine and phenylephrine, help temporarily relieve the stuffy nose of allergies. Decongestants are found in many medicines and come as pills, nose sprays, and nose drops. They could increase heart rate and cause tachycardia and tremor. Nasal Afrin should not be used for more than 3 days because you can become dependent on them. This causes you to feel even more stopped-up when you try to quit using them.  Nasal sprays: steroids or antihistaminics. Over the counter intranasal sterids: Nasocort,Rhinocort,or Flonase.You won't notice their benefits for up to 2 weeks after starting them. Allergy shots or sublingual tablets when other treatment do not help.This is done by immunologists.    Please be sure medication list is accurate. If a new problem present, please set up appointment sooner than planned today.

## 2017-07-12 ENCOUNTER — Other Ambulatory Visit: Payer: BC Managed Care – PPO

## 2017-07-15 ENCOUNTER — Other Ambulatory Visit: Payer: BC Managed Care – PPO

## 2017-07-18 NOTE — Progress Notes (Signed)
HPI:   Jennifer Mcmahon is a 36 y.o. female, who is here today for her routine physical. She is here with her 44-year-old son, she would like to postpone her pap smear and to do the rest of her physical.  Last CPE: 04/2015.  Regular exercise 3 or more time per week: Not consistently. Following a healthy diet: In general she does, has decreased met intake.  She lives with her husband and 2 children.  Chronic medical problems: Obesity, allergic rhinitis,hypothyroidism, vit D deficiency,HTN on non pharmacologic treatment. Currently on Levothyroxine 100 mcg daily.  Lab Results  Component Value Date   TSH 1.31 04/30/2016   Lab Results  Component Value Date   CHOL 187 04/12/2015   HDL 51 04/12/2015   LDLCALC 113 04/12/2015   TRIG 115 04/12/2015   CHOLHDL 3.7 04/12/2015    Pap smear almost 3 years. Hx of abnormal pap smears: Over 10 years ago, S/p colpo Bx, she does not recall.  M: 11 G:2 L: 2 She uses condoms for birth control.  Hx of STD's: HPV.  Immunization History  Administered Date(s) Administered  . Influenza,inj,Quad PF,6+ Mos 05/08/2013, 04/15/2015, 04/30/2016, 07/19/2017  . Tdap 11/05/2002, 05/08/2013   PGM breast cancer and cousins.  Concerns today:  She is complaining of headache, "migraines", that started early this year. Fronto temporal, L>R. "Lightning" that affects her vision, this start at the same time with headache. + Phonophobia and nausea.She has headache q 2 weeks.  Worse around her menstrual periods. Takes Ibuprofen and rests in a dark room.these help. Headache lasts about 3-4 hours. She also has frequent milder, "normal" headache, constantly and usually right-sided.   Review of Systems  Constitutional: Positive for fatigue. Negative for appetite change, fever and unexpected weight change.  HENT: Negative for dental problem, hearing loss, nosebleeds, trouble swallowing and voice change.   Eyes: Negative for redness and visual  disturbance.  Respiratory: Negative for cough, shortness of breath and wheezing.   Cardiovascular: Negative for chest pain and leg swelling.  Gastrointestinal: Negative for abdominal pain, blood in stool, nausea and vomiting.       No changes in bowel habits.  Endocrine: Negative for cold intolerance, heat intolerance, polydipsia, polyphagia and polyuria.  Genitourinary: Negative for decreased urine volume, dysuria, hematuria, menstrual problem, vaginal bleeding and vaginal discharge.       No breast tenderness or nipple discharge.  Musculoskeletal: Negative for gait problem and myalgias.  Skin: Negative for rash.  Allergic/Immunologic: Positive for environmental allergies.  Neurological: Positive for headaches. Negative for syncope, weakness and numbness.  Hematological: Negative for adenopathy. Does not bruise/bleed easily.  Psychiatric/Behavioral: Positive for sleep disturbance. Negative for confusion. The patient is nervous/anxious.   All other systems reviewed and are negative.     Current Outpatient Medications on File Prior to Visit  Medication Sig Dispense Refill  . fluticasone (FLONASE) 50 MCG/ACT nasal spray Place 1 spray into both nostrils 2 (two) times daily. 16 g 3  . levothyroxine (SYNTHROID, LEVOTHROID) 100 MCG tablet Take 1 tablet (100 mcg total) by mouth daily before breakfast. 90 tablet 3  . OVER THE COUNTER MEDICATION Take 1 tablet by mouth daily as needed. Sleep aid     No current facility-administered medications on file prior to visit.      Past Medical History:  Diagnosis Date  . Anemia   . Anxiety    postpartum  . Depression    post partum  . Gestational diabetes  diet controlled  . Hypercalcemia   . Hyperlipidemia   . Hypertension    not on high blood pressure meds anymore  . Hypothyroidism   . PE (pulmonary embolism) 2004  . Postpartum care following vaginal delivery (11/15) 06/18/2014  . Thyroid disease    hypothyroidism  . Vitamin D  deficiency     Past Surgical History:  Procedure Laterality Date  . CHOLECYSTECTOMY N/A 06/12/2016   Procedure: LAPAROSCOPIC CHOLECYSTECTOMY;  Surgeon: Ralene Ok, MD;  Location: [facility];  Service: General;  Laterality: N/A;  . EYE SURGERY     lasik  . WISDOM TOOTH EXTRACTION      Allergies  Allergen Reactions  . Ramipril Cough    Family History  Problem Relation Age of Onset  . Cancer Mother        uterine and ovarian  . Hypertension Mother   . Varicose Veins Mother   . Thyroid disease Mother   . Hyperlipidemia Mother   . Mental illness Mother   . Tuberculosis Sister   . Heart disease Maternal Grandmother   . Varicose Veins Maternal Grandfather   . Mental illness Maternal Grandfather   . Bipolar disorder Paternal Grandfather   . Cancer Paternal Grandmother     Social History   Socioeconomic History  . Marital status: Married    Spouse name: None  . Number of children: None  . Years of education: None  . Highest education level: None  Social Needs  . Financial resource strain: None  . Food insecurity - worry: None  . Food insecurity - inability: None  . Transportation needs - medical: None  . Transportation needs - non-medical: None  Occupational History  . None  Tobacco Use  . Smoking status: Former Smoker    Types: Cigarettes    Last attempt to quit: 02/25/2008    Years since quitting: 9.4  . Smokeless tobacco: Never Used  Substance and Sexual Activity  . Alcohol use: Yes    Comment: social  . Drug use: No  . Sexual activity: Yes    Birth control/protection: None, Condom  Other Topics Concern  . None  Social History Narrative  . None     Vitals:   07/19/17 0816  BP: 122/82  Pulse: 81  Resp: 12  Temp: 98 F (36.7 C)  SpO2: 98%   Body mass index is 34.78 kg/m.   Wt Readings from Last 3 Encounters:  07/19/17 215 lb 8 oz (97.8 kg)  01/01/17 214 lb 4 oz (97.2 kg)  08/05/16 206 lb (93.4 kg)     Physical Exam  Nursing note and  vitals reviewed. Constitutional: She is oriented to person, place, and time. She appears well-developed. No distress.  HENT:  Head: Normocephalic and atraumatic.  Right Ear: Hearing, tympanic membrane, external ear and ear canal normal.  Left Ear: Hearing, tympanic membrane, external ear and ear canal normal.  Mouth/Throat: Uvula is midline, oropharynx is clear and moist and mucous membranes are normal.  Eyes: Conjunctivae and EOM are normal. Pupils are equal, round, and reactive to light.  Neck: No tracheal deviation present. No thyroid mass and no thyromegaly (palpable) present.  Cardiovascular: Normal rate and regular rhythm.  No murmur heard. Pulses:      Dorsalis pedis pulses are 2+ on the right side, and 2+ on the left side.  Respiratory: Effort normal and breath sounds normal. No respiratory distress.  GI: Soft. She exhibits no mass. There is no hepatomegaly. There is no tenderness.  Genitourinary:  Genitourinary Comments: Deferred for next visit.  Musculoskeletal: She exhibits no edema.  No major deformity or signs of synovitis appreciated.  Lymphadenopathy:    She has no cervical adenopathy.       Right: No supraclavicular adenopathy present.       Left: No supraclavicular adenopathy present.  Neurological: She is alert and oriented to person, place, and time. She has normal strength. No cranial nerve deficit. Coordination and gait normal.  Reflex Scores:      Bicep reflexes are 2+ on the right side and 2+ on the left side.      Patellar reflexes are 2+ on the right side and 2+ on the left side. Skin: Skin is warm. No rash noted. No erythema.  Psychiatric: Her mood appears anxious. Cognition and memory are normal.  Well groomed, good eye contact.     ASSESSMENT AND PLAN:   [name] was seen today for annual exam.  Diagnoses and all orders for this visit:  Lab Results  Component Value Date   CHOL 201 (H) 07/19/2017   HDL 52.50 07/19/2017   LDLCALC 127 (H)  07/19/2017   TRIG 106.0 07/19/2017   CHOLHDL 4 07/19/2017   Lab Results  Component Value Date   TSH 3.63 07/19/2017   Lab Results  Component Value Date   CREATININE 0.84 07/19/2017   BUN 17 07/19/2017   NA 137 07/19/2017   K 4.3 07/19/2017   CL 107 07/19/2017   CO2 23 07/19/2017    Routine general medical examination at a health care facility  We discussed the importance of regular physical activity and healthy diet for prevention of chronic illness and/or complications. Preventive guidelines reviewed. Vaccination up to date. Gyn exam next OV.  Next CPE in a year.  Diabetes mellitus screening -     Basic metabolic panel  Hypothyroidism, unspecified type  No changes in current management, will follow labs done today and will give further recommendations accordingly.  -     TSH  Screening for lipid disorders -     Lipid panel  Need for influenza vaccination -     Flu Vaccine QUAD 36+ mos IM  Headache, unspecified headache type  Migraine vs tension headaches. After discussion of treatment options, she would like to try Amitriptyline. Some side effects discussed, med may also help her with sleep. I do not think imaging is needed at this time. Instructed about warning signs. F/U in 6 weeks.    -     amitriptyline (ELAVIL) 25 MG tablet; Take 1 tablet (25 mg total) by mouth at bedtime.      Return in 6 weeks (on 08/30/2017) for HA,gyn exam.       Jennifer Mcmahon G. Martin[place], MD  [facility]. [place] office.

## 2017-07-19 ENCOUNTER — Encounter: Payer: Self-pay | Admitting: Family Medicine

## 2017-07-19 ENCOUNTER — Ambulatory Visit (INDEPENDENT_AMBULATORY_CARE_PROVIDER_SITE_OTHER): Payer: BC Managed Care – PPO | Admitting: Family Medicine

## 2017-07-19 VITALS — BP 122/82 | HR 81 | Temp 98.0°F | Resp 12 | Ht 66.0 in | Wt 215.5 lb

## 2017-07-19 DIAGNOSIS — Z23 Encounter for immunization: Secondary | ICD-10-CM

## 2017-07-19 DIAGNOSIS — Z0000 Encounter for general adult medical examination without abnormal findings: Secondary | ICD-10-CM

## 2017-07-19 DIAGNOSIS — Z13220 Encounter for screening for lipoid disorders: Secondary | ICD-10-CM | POA: Diagnosis not present

## 2017-07-19 DIAGNOSIS — E039 Hypothyroidism, unspecified: Secondary | ICD-10-CM | POA: Diagnosis not present

## 2017-07-19 DIAGNOSIS — Z131 Encounter for screening for diabetes mellitus: Secondary | ICD-10-CM | POA: Diagnosis not present

## 2017-07-19 DIAGNOSIS — R519 Headache, unspecified: Secondary | ICD-10-CM | POA: Insufficient documentation

## 2017-07-19 DIAGNOSIS — R51 Headache: Secondary | ICD-10-CM

## 2017-07-19 DIAGNOSIS — Z0001 Encounter for general adult medical examination with abnormal findings: Secondary | ICD-10-CM | POA: Diagnosis not present

## 2017-07-19 LAB — LIPID PANEL
CHOL/HDL RATIO: 4
Cholesterol: 201 mg/dL — ABNORMAL HIGH (ref 0–200)
HDL: 52.5 mg/dL (ref >39.00)
LDL Cholesterol: 127 mg/dL — ABNORMAL HIGH (ref 0–99)
NONHDL: 148.51
Triglycerides: 106 mg/dL (ref 0.0–149.0)
VLDL: 21.2 mg/dL (ref 0.0–40.0)

## 2017-07-19 LAB — BASIC METABOLIC PANEL
BUN: 17 mg/dL (ref 6–23)
CHLORIDE: 107 meq/L (ref 96–112)
CO2: 23 mEq/L (ref 19–32)
Calcium: 9.4 mg/dL (ref 8.4–10.5)
Creatinine, Ser: 0.84 mg/dL (ref 0.40–1.20)
GFR: 80.46 mL/min (ref >60.00)
GLUCOSE: 91 mg/dL (ref 70–99)
POTASSIUM: 4.3 meq/L (ref 3.5–5.1)
Sodium: 137 mEq/L (ref 135–145)

## 2017-07-19 LAB — TSH: TSH: 3.63 u[IU]/mL (ref 0.35–4.50)

## 2017-07-19 MED ORDER — AMITRIPTYLINE HCL 25 MG PO TABS: 25.0000 mg | ORAL_TABLET | Freq: Every day | ORAL | 1 refills | Status: DC

## 2017-07-19 NOTE — Patient Instructions (Signed)
A few things to remember from today's visit:   Routine general medical examination at a health care facility  Diabetes mellitus screening - Plan: Basic metabolic panel  Hypercalcemia  Hypothyroidism, unspecified type - Plan: TSH  Screening for lipid disorders - Plan: Lipid panel  Today you have you routine preventive visit.  At least 150 minutes of moderate exercise per week, daily brisk walking for 15-30 min is a good exercise option. Healthy diet low in saturated (animal) fats and sweets and consisting of fresh fruits and vegetables, lean meats such as fish and white chicken and whole grains.  These are some of recommendations for screening depending of age and risk factors:   - Vaccines:  Tdap vaccine every 10 years.  Shingles vaccine recommended at age 38, could be given after 38 years of age but not sure about insurance coverage.   Pneumonia vaccines:  Prevnar 13 at 65 and Pneumovax at 66. Sometimes Pneumovax is giving earlier if history of smoking, lung disease,diabetes,kidney disease among some.    Screening for diabetes at age 38 and every 3 years.  Cervical cancer prevention:  Pap smear starts at 38 years of age and continues periodically until 38 years old in low risk women. Pap smear every 3 years between 6321 and 38 years old. Pap smear every 3-5 years between women 30 and older if pap smear negative and HPV screening negative.   -Breast cancer: Mammogram: There is disagreement between experts about when to start screening in low risk asymptomatic female but recent recommendations are to start screening at 1740 and not later than 38 years old , every 1-2 years and after 38 yo q 2 years. Screening is recommended until 38 years old but some women can continue screening depending of healthy issues.   Colon cancer screening: starts at 38 years old until 38 years old.  Cholesterol disorder screening at age 38 and every 3 years.  Also recommended:  1. Dental visit-  Brush and floss your teeth twice daily; visit your dentist twice a year. 2. Eye doctor- Get an eye exam at least every 2 years. 3. Helmet use- Always wear a helmet when riding a bicycle, motorcycle, rollerblading or skateboarding. 4. Safe sex- If you may be exposed to sexually transmitted infections, use a condom. 5. Seat belts- Seat belts can save your live; always wear one. 6. Smoke/Carbon Monoxide detectors- These detectors need to be installed on the appropriate level of your home. Replace batteries at least once a year. 7. Skin cancer- When out in the sun please cover up and use sunscreen 15 SPF or higher. 8. Violence- If anyone is threatening or hurting you, please tell your healthcare provider.  9. Drink alcohol in moderation- Limit alcohol intake to one drink or less per day. Never drink and drive.  Please be sure medication list is accurate. If a new problem present, please set up appointment sooner than planned today.

## 2017-07-24 MED ORDER — LEVOTHYROXINE SODIUM 100 MCG PO TABS: 100.0000 ug | ORAL_TABLET | Freq: Every day | ORAL | 3 refills | Status: DC

## 2017-08-29 NOTE — Progress Notes (Deleted)
      HPI:   [name] is a 37 y.o. female, who is here today to follow on recent OV.   She was seen on 07/19/2017 for her CPE.  Last visit she was complaining of headaches, frontotemporal, bilateral. Associated photophobia and nausea, she was having headaches every 2 weeks.  Amitriptyline 25 mg at bedtime was recommended.  Overall *** is feeling better, no new symptoms reported.       Review of Systems    Current Outpatient Medications on File Prior to Visit  Medication Sig Dispense Refill  . amitriptyline (ELAVIL) 25 MG tablet Take 1 tablet (25 mg total) by mouth at bedtime. 30 tablet 1  . fluticasone (FLONASE) 50 MCG/ACT nasal spray Place 1 spray into both nostrils 2 (two) times daily. 16 g 3  . levothyroxine (SYNTHROID, LEVOTHROID) 100 MCG tablet Take 1 tablet (100 mcg total) by mouth daily before breakfast. 90 tablet 3  . OVER THE COUNTER MEDICATION Take 1 tablet by mouth daily as needed. Sleep aid     No current facility-administered medications on file prior to visit.      Past Medical History:  Diagnosis Date  . Anemia   . Anxiety    postpartum  . Depression    post partum  . Gestational diabetes    diet controlled  . Hypercalcemia   . Hyperlipidemia   . Hypertension    not on high blood pressure meds anymore  . Hypothyroidism   . PE (pulmonary embolism) 2004  . Postpartum care following vaginal delivery (11/15) 06/18/2014  . Thyroid disease    hypothyroidism  . Vitamin D deficiency    Allergies  Allergen Reactions  . Ramipril Cough    Social History   Socioeconomic History  . Marital status: Married    Spouse name: Not on file  . Number of children: Not on file  . Years of education: Not on file  . Highest education level: Not on file  Social Needs  . Financial resource strain: Not on file  . Food insecurity - worry: Not on file  . Food insecurity - inability: Not on file  . Transportation needs - medical: Not on file  .  Transportation needs - non-medical: Not on file  Occupational History  . Not on file  Tobacco Use  . Smoking status: Former Smoker    Types: Cigarettes    Last attempt to quit: 02/25/2008    Years since quitting: 9.5  . Smokeless tobacco: Never Used  Substance and Sexual Activity  . Alcohol use: Yes    Comment: social  . Drug use: No  . Sexual activity: Yes    Birth control/protection: None, Condom  Other Topics Concern  . Not on file  Social History Narrative  . Not on file    There were no vitals filed for this visit. There is no height or weight on file to calculate BMI.      Physical Exam    ASSESSMENT AND PLAN:     There are no diagnoses linked to this encounter.             Janille Draughon G. [place], MD  [facility]. [place] office.

## 2017-08-30 ENCOUNTER — Ambulatory Visit: Payer: BC Managed Care – PPO | Admitting: Family Medicine

## 2017-08-30 DIAGNOSIS — Z0289 Encounter for other administrative examinations: Secondary | ICD-10-CM

## 2017-09-13 ENCOUNTER — Other Ambulatory Visit: Payer: Self-pay | Admitting: Family Medicine

## 2017-09-13 DIAGNOSIS — R519 Headache, unspecified: Secondary | ICD-10-CM

## 2017-09-13 DIAGNOSIS — R51 Headache: Principal | ICD-10-CM

## 2017-12-27 ENCOUNTER — Other Ambulatory Visit: Payer: Self-pay | Admitting: Family Medicine

## 2017-12-27 DIAGNOSIS — R51 Headache: Principal | ICD-10-CM

## 2017-12-27 DIAGNOSIS — R519 Headache, unspecified: Secondary | ICD-10-CM

## 2018-01-25 ENCOUNTER — Other Ambulatory Visit: Payer: Self-pay | Admitting: Family Medicine

## 2018-01-25 DIAGNOSIS — R51 Headache: Principal | ICD-10-CM

## 2018-01-25 DIAGNOSIS — R519 Headache, unspecified: Secondary | ICD-10-CM

## 2018-01-25 NOTE — Telephone Encounter (Signed)
Left a message on identified voicemail informing the pt she needs to call back to schedule cpx.   CRM created.

## 2018-03-25 ENCOUNTER — Ambulatory Visit: Payer: BC Managed Care – PPO | Admitting: Family Medicine

## 2018-03-25 ENCOUNTER — Encounter: Payer: Self-pay | Admitting: Family Medicine

## 2018-03-25 VITALS — BP 124/83 | HR 95 | Temp 98.2°F | Resp 12 | Ht 66.0 in | Wt 225.1 lb

## 2018-03-25 DIAGNOSIS — R519 Headache, unspecified: Secondary | ICD-10-CM

## 2018-03-25 DIAGNOSIS — R51 Headache: Secondary | ICD-10-CM

## 2018-03-25 DIAGNOSIS — G4482 Headache associated with sexual activity: Secondary | ICD-10-CM | POA: Diagnosis not present

## 2018-03-25 DIAGNOSIS — R202 Paresthesia of skin: Secondary | ICD-10-CM

## 2018-03-25 MED ORDER — TOPIRAMATE 50 MG PO TABS: 50.0000 mg | ORAL_TABLET | Freq: Every day | ORAL | 1 refills | Status: DC

## 2018-03-25 NOTE — Progress Notes (Signed)
HPI:  Chief Complaint  Patient presents with  . Migraine    since last Saturday, stopped taking Elavil due to side effects    Jennifer Mcmahon is a 37 y.o. female, who is here today complaining of worsening headaches. I saw her on 07/19/2017, when she was complaining about daily "normal" headache and migraine headaches.  She has had headaches about a year ago, frontotemporal, more frequent on her left side. Associated photophobia and nausea, having these episodes about twice per week. Amitriptyline 25 mg was started.  She took medication until 6 days ago, she started with initially lower and later upper extremities "spasms", mainly at night and "very painful."   It seems like amitriptyline was helping with intensity but she was still having daily headaches, migraine episodes once per week while taking medication. Headache intensity back to her baseline after discontinuing medication.  She is very concerned because 6 days ago she had a severe swelling onset of migraine episode associated with orgasm, "little" nausea and dizziness associated.  She cannot describe type of pain but after intercourse headache gradually improve, she felt like her "head was open" and pressure release, comparing this with the noise of a pressure cooker when the valve is lifted. She stated that a few years ago she had a similar severe headache also while having intercourse.  In general she has milder headaches with sex intercourse and masturbation.   No associated visual changes or focal deficit. Negative for fever, chills, sore throat, numbness, or tingling.  She has not identified exacerbating factors for headache. Is alleviated by lying down for a few hours and taking OTC NSAIDs. She states that she is taking ibuprofen more often than usual.  She is also complaining of tingling of both hands, which she has had for years but seems to be more frequent now. She thinks she needs to be "check  for carpal tunnel" syndrome. She is right-handed, problem is worse in left hand.  It involves fourth and fifth finger and radiates up to elbow. It seems to be exacerbated by typing and needling. Alleviated by rest. She has not used OTC splints. Occasional cervical pain, no radiated.    Review of Systems  Constitutional: Negative for activity change, appetite change, fatigue and fever.  HENT: Negative for mouth sores, nosebleeds, sore throat and trouble swallowing.   Eyes: Positive for photophobia. Negative for redness and visual disturbance.  Respiratory: Negative for shortness of breath and wheezing.   Cardiovascular: Negative for chest pain, palpitations and leg swelling.  Gastrointestinal: Negative for abdominal pain, nausea and vomiting.       Negative for changes in bowel habits.  Genitourinary: Negative for decreased urine volume and hematuria.  Musculoskeletal: Positive for neck pain. Negative for gait problem.  Skin: Negative for pallor and rash.  Neurological: Positive for headaches. Negative for seizures, syncope and weakness.      Current Outpatient Medications on File Prior to Visit  Medication Sig Dispense Refill  . fluticasone (FLONASE) 50 MCG/ACT nasal spray Place 1 spray into both nostrils 2 (two) times daily. 16 g 3  . levothyroxine (SYNTHROID, LEVOTHROID) 100 MCG tablet Take 1 tablet (100 mcg total) by mouth daily before breakfast. 90 tablet 3  . OVER THE COUNTER MEDICATION Take 1 tablet by mouth daily as needed. Sleep aid     No current facility-administered medications on file prior to visit.      Past Medical History:  Diagnosis Date  . Anemia   .  Anxiety    postpartum  . Depression    post partum  . Gestational diabetes    diet controlled  . Hypercalcemia   . Hyperlipidemia   . Hypertension    not on high blood pressure meds anymore  . Hypothyroidism   . PE (pulmonary embolism) 2004  . Postpartum care following vaginal delivery (11/15)  06/18/2014  . Thyroid disease    hypothyroidism  . Vitamin D deficiency    Allergies  Allergen Reactions  . Ramipril Cough    Social History   Socioeconomic History  . Marital status: Married    Spouse name: Not on file  . Number of children: Not on file  . Years of education: Not on file  . Highest education level: Not on file  Occupational History  . Not on file  Social Needs  . Financial resource strain: Not on file  . Food insecurity:    Worry: Not on file    Inability: Not on file  . Transportation needs:    Medical: Not on file    Non-medical: Not on file  Tobacco Use  . Smoking status: Former Smoker    Types: Cigarettes    Last attempt to quit: 02/25/2008    Years since quitting: 10.0  . Smokeless tobacco: Never Used  Substance and Sexual Activity  . Alcohol use: Yes    Comment: social  . Drug use: No  . Sexual activity: Yes    Birth control/protection: None, Condom  Lifestyle  . Physical activity:    Days per week: Not on file    Minutes per session: Not on file  . Stress: Not on file  Relationships  . Social connections:    Talks on phone: Not on file    Gets together: Not on file    Attends religious service: Not on file    Active member of club or organization: Not on file    Attends meetings of clubs or organizations: Not on file    Relationship status: Not on file  Other Topics Concern  . Not on file  Social History Narrative  . Not on file    Vitals:   03/25/18 1039  BP: 124/83  Pulse: 95  Resp: 12  Temp: 98.2 F (36.8 C)  SpO2: 98%   Body mass index is 36.34 kg/m.   Physical Exam  Nursing note and vitals reviewed. Constitutional: She is oriented to person, place, and time. She appears well-developed. No distress.  HENT:  Head: Normocephalic and atraumatic.  Mouth/Throat: Oropharynx is clear and moist and mucous membranes are normal.  Eyes: Pupils are equal, round, and reactive to light. Conjunctivae are normal.    Cardiovascular: Normal rate and regular rhythm.  No murmur heard. Pulses:      Dorsalis pedis pulses are 2+ on the right side, and 2+ on the left side.  Respiratory: Effort normal and breath sounds normal. No respiratory distress.  GI: Soft. She exhibits no mass. There is no hepatomegaly. There is no tenderness.  Musculoskeletal: She exhibits no edema.  Tinel and Phalen negative bilateral.   Lymphadenopathy:    She has no cervical adenopathy.  Neurological: She is alert and oriented to person, place, and time. She has normal strength. No cranial nerve deficit. Gait normal.  Reflex Scores:      Bicep reflexes are 2+ on the right side and 2+ on the left side.      Patellar reflexes are 2+ on the right side and 2+ on  the left side. Skin: Skin is warm. No rash noted. No erythema.  Psychiatric: Her mood appears anxious.  Well groomed, poor eye contact.     ASSESSMENT AND PLAN:  [name] was seen today for migraine.  Diagnoses and all orders for this visit:  Headache associated with orgasm -     MR Brain Wo Contrast; Future  Headache, unspecified headache type  It seems like a combination of migraine headaches and tension-like headache. Because still having daily headache + episodes of headache with orgasm, appointment with neurologist will be arranged. After discussion of some side effects, she agrees with trying Topamax, she will start with 25 mg daily and will increase to 50 mg if well-tolerated. Instructed about warning signs.  -     MR Brain Wo Contrast; Future -     Ambulatory referral to Neurosurgery -     topiramate (TOPAMAX) 50 MG tablet; Take 1 tablet (50 mg total) by mouth at bedtime.  Paresthesia of both hands  We discussed possible etiologies, I am not sure symptoms are caused by carpal tunnel syndrome. ?  Cubital neuropathy, radicular pain among some. For now I recommended wearing wrist splints at night. She can address this problem with neurologist as well,  she may need EMG.     Return if symptoms worsen or fail to improve.     Damaris Abeln G. [place], MD  [facility]. [place] office.

## 2018-03-25 NOTE — Patient Instructions (Addendum)
A few things to remember from today's visit:   Headache, unspecified headache type  Primary cough headache  Headache associated with orgasm  Paresthesia of both hands  Wrist splint bilateral at night.   Please be sure medication list is accurate. If a new problem present, please set up appointment sooner than planned today.

## 2018-04-03 ENCOUNTER — Ambulatory Visit
Admission: RE | Admit: 2018-04-03 | Discharge: 2018-04-03 | Disposition: A | Discharge status: Home / Self Care | Payer: BC Managed Care – PPO | Source: Ambulatory Visit | Attending: Family Medicine | Admitting: Family Medicine

## 2018-04-03 DIAGNOSIS — G4482 Headache associated with sexual activity: Secondary | ICD-10-CM

## 2018-04-03 DIAGNOSIS — R51 Headache: Principal | ICD-10-CM

## 2018-04-03 DIAGNOSIS — R519 Headache, unspecified: Secondary | ICD-10-CM

## 2018-04-06 ENCOUNTER — Telehealth: Payer: Self-pay | Admitting: Family Medicine

## 2018-04-06 NOTE — Telephone Encounter (Signed)
Message sent to Dr. Jordan for review. 

## 2018-04-06 NOTE — Telephone Encounter (Signed)
Copied from CRM [id number]. Topic: Quick Communication - See Telephone Encounter >> Apr 06, 2018  2:15 PM Mickel Baas B, NT wrote: CRM for notification. See Telephone encounter for: 04/06/18. Patient calling and would like a call back with the MRI results from Sunday 04/03/18

## 2018-04-08 NOTE — Telephone Encounter (Signed)
Brain MRI was already reviewed. Betty [place], MD

## 2018-04-13 ENCOUNTER — Encounter (HOSPITAL_COMMUNITY): Payer: Self-pay

## 2018-04-13 ENCOUNTER — Ambulatory Visit: Payer: Self-pay | Admitting: *Deleted

## 2018-04-13 ENCOUNTER — Ambulatory Visit: Payer: BC Managed Care – PPO | Admitting: Family Medicine

## 2018-04-13 ENCOUNTER — Emergency Department (HOSPITAL_COMMUNITY)
Admission: EM | Admit: 2018-04-13 | Discharge: 2018-04-14 | Disposition: A | Discharge status: Home / Self Care | Payer: BC Managed Care – PPO | Attending: Emergency Medicine | Admitting: Emergency Medicine

## 2018-04-13 ENCOUNTER — Telehealth: Payer: Self-pay | Admitting: Family Medicine

## 2018-04-13 ENCOUNTER — Other Ambulatory Visit: Payer: Self-pay

## 2018-04-13 ENCOUNTER — Encounter: Payer: Self-pay | Admitting: Family Medicine

## 2018-04-13 ENCOUNTER — Emergency Department (HOSPITAL_COMMUNITY): Payer: BC Managed Care – PPO

## 2018-04-13 VITALS — BP 130/80 | HR 100 | Temp 98.4°F | Resp 12 | Ht 66.0 in | Wt 220.4 lb

## 2018-04-13 DIAGNOSIS — R200 Anesthesia of skin: Secondary | ICD-10-CM

## 2018-04-13 DIAGNOSIS — Z86718 Personal history of other venous thrombosis and embolism: Secondary | ICD-10-CM | POA: Insufficient documentation

## 2018-04-13 DIAGNOSIS — G43901 Migraine, unspecified, not intractable, with status migrainosus: Secondary | ICD-10-CM | POA: Diagnosis not present

## 2018-04-13 DIAGNOSIS — E039 Hypothyroidism, unspecified: Secondary | ICD-10-CM | POA: Insufficient documentation

## 2018-04-13 DIAGNOSIS — Z87891 Personal history of nicotine dependence: Secondary | ICD-10-CM | POA: Insufficient documentation

## 2018-04-13 DIAGNOSIS — R202 Paresthesia of skin: Secondary | ICD-10-CM

## 2018-04-13 DIAGNOSIS — I10 Essential (primary) hypertension: Secondary | ICD-10-CM

## 2018-04-13 DIAGNOSIS — R51 Headache: Secondary | ICD-10-CM | POA: Diagnosis not present

## 2018-04-13 DIAGNOSIS — Z79899 Other long term (current) drug therapy: Secondary | ICD-10-CM | POA: Insufficient documentation

## 2018-04-13 DIAGNOSIS — R519 Headache, unspecified: Secondary | ICD-10-CM

## 2018-04-13 DIAGNOSIS — E785 Hyperlipidemia, unspecified: Secondary | ICD-10-CM | POA: Diagnosis not present

## 2018-04-13 DIAGNOSIS — R531 Weakness: Secondary | ICD-10-CM | POA: Diagnosis not present

## 2018-04-13 LAB — CBC
HEMATOCRIT: 38.7 % (ref 36.0–46.0)
HEMOGLOBIN: 13.3 g/dL (ref 12.0–15.0)
MCH: 29.4 pg (ref 26.0–34.0)
MCHC: 34.4 g/dL (ref 30.0–36.0)
MCV: 85.6 fL (ref 78.0–100.0)
PLATELETS: 233 10*3/uL (ref 150–400)
RBC: 4.52 MIL/uL (ref 3.87–5.11)
RDW: 12.5 % (ref 11.5–15.5)
WBC: 8.1 10*3/uL (ref 4.0–10.5)

## 2018-04-13 LAB — BASIC METABOLIC PANEL
Anion gap: 7 (ref 5–15)
BUN: 18 mg/dL (ref 6–20)
CALCIUM: 9.5 mg/dL (ref 8.9–10.3)
CHLORIDE: 114 mmol/L — AB (ref 98–111)
CO2: 21 mmol/L — ABNORMAL LOW (ref 22–32)
CREATININE: 0.93 mg/dL (ref 0.44–1.00)
GFR calc Af Amer: >60 mL/min (ref >60)
Glucose, Bld: 100 mg/dL — ABNORMAL HIGH (ref 70–99)
Potassium: 3.6 mmol/L (ref 3.5–5.1)
SODIUM: 142 mmol/L (ref 135–145)

## 2018-04-13 MED ORDER — LABETALOL HCL 5 MG/ML IV SOLN
20.0000 mg | Freq: Once | INTRAVENOUS | Status: AC
  Administered 2018-04-13: 20 mg via INTRAVENOUS
  Filled 2018-04-13: qty 4

## 2018-04-13 MED ORDER — PROCHLORPERAZINE EDISYLATE 10 MG/2ML IJ SOLN
10.0000 mg | Freq: Once | INTRAMUSCULAR | Status: AC
Start: 2018-04-13 — End: 2018-04-13
  Administered 2018-04-13: 10 mg via INTRAVENOUS
  Filled 2018-04-13: qty 2

## 2018-04-13 MED ORDER — DIPHENHYDRAMINE HCL 50 MG/ML IJ SOLN
25.0000 mg | Freq: Once | INTRAMUSCULAR | Status: AC
  Administered 2018-04-13: 25 mg via INTRAVENOUS
  Filled 2018-04-13: qty 1

## 2018-04-13 MED ORDER — LABETALOL HCL 5 MG/ML IV SOLN
20.0000 mg | Freq: Once | INTRAVENOUS | Status: AC
Start: 2018-04-13 — End: 2018-04-13
  Administered 2018-04-13: 20 mg via INTRAVENOUS
  Filled 2018-04-13: qty 4

## 2018-04-13 MED ORDER — DEXAMETHASONE SODIUM PHOSPHATE 10 MG/ML IJ SOLN
10.0000 mg | Freq: Once | INTRAMUSCULAR | Status: AC
  Administered 2018-04-13: 10 mg via INTRAVENOUS
  Filled 2018-04-13: qty 1

## 2018-04-13 MED ORDER — AMLODIPINE BESYLATE 5 MG PO TABS: 5.0000 mg | ORAL_TABLET | Freq: Every day | ORAL | 0 refills | Status: DC

## 2018-04-13 NOTE — ED Provider Notes (Signed)
[facility]-EMERGENCY DEPT Provider Note   CSN: [id number] Arrival date & time: 04/13/18  1823     History   Chief Complaint Chief Complaint  Patient presents with  . Numbness  . Headache    HPI Jennifer Mcmahon is a 38 y.o. female.  HPI Patient presents to the emergency room for evaluation of recurrent headaches.  Patient states she has a history of having migraine headaches.  Several months ago she started having severe headaches associated with orgasms.  That eventually resolved.  Patient states she started having recurrent symptoms in the last few weeks.  She saw her primary care doctor and had an MRI done.  The results of that did not show any acute abnormalities.  Patient went back to her doctor though because she started having numbness on her left arm.  She also has weakness in her left hand.  She started to feel pins-and-needles in her left arm and left leg and bilateral feet.  Patient saw her primary care doctor thought she needed another MRI.  They were unable to arrange that so they sent her to the emergency room for repeat MRI. Past Medical History:  Diagnosis Date  . Anemia   . Anxiety    postpartum  . Depression    post partum  . Gestational diabetes    diet controlled  . Hypercalcemia   . Hyperlipidemia   . Hypertension    not on high blood pressure meds anymore  . Hypothyroidism   . PE (pulmonary embolism) 2004  . Postpartum care following vaginal delivery (11/15) 06/18/2014  . Thyroid disease    hypothyroidism  . Vitamin D deficiency     Patient Active Problem List   Diagnosis Date Noted  . Headache, unspecified headache type 07/19/2017  . Allergic rhinitis 01/01/2017  . BMI 33.0-33.9,adult 04/30/2016  . Postpartum care following vaginal delivery (11/15) 06/18/2014  . Gestational diabetes mellitus, class A1 06/17/2014  . Polyhydramnios in third trimester, antepartum 06/17/2014  . Hypothyroidism affecting pregnancy in third  trimester, antepartum 06/17/2014  . Gestational hypertension without significant proteinuria in third trimester 06/17/2014  . Gestational hypertension with significant proteinuria, postpartum 06/17/2014  . Hypercalcemia 04/28/2011  . HTN (hypertension) 04/17/2011  . Hyperlipidemia 04/17/2011  . Hypothyroidism 04/17/2011    Past Surgical History:  Procedure Laterality Date  . CHOLECYSTECTOMY N/A 06/12/2016   Procedure: LAPAROSCOPIC CHOLECYSTECTOMY;  Surgeon: Axel Filler, MD;  Location: [facility] OR;  Service: General;  Laterality: N/A;  . EYE SURGERY     lasik  . WISDOM TOOTH EXTRACTION       OB History    Gravida  2   Para  2   Term  2   Preterm      AB      Living  2     SAB      TAB      Ectopic      Multiple  0   Live Births  2            Home Medications    Prior to Admission medications   Medication Sig Start Date End Date Taking? Authorizing Provider  fluticasone (FLONASE) 50 MCG/ACT nasal spray Place 1 spray into both nostrils 2 (two) times daily. Patient taking differently: Place 1 spray into both nostrils 2 (two) times daily as needed for allergies.  01/01/17  Yes [place], Betty G, MD  levothyroxine (SYNTHROID, LEVOTHROID) 100 MCG tablet Take 1 tablet (100 mcg total) by mouth daily before  breakfast. 07/24/17  Yes [place], Betty G, MD  OVER THE COUNTER MEDICATION Take 1 tablet by mouth daily as needed. Sleep aid   Yes [provider]  topiramate (TOPAMAX) 50 MG tablet Take 1 tablet (50 mg total) by mouth at bedtime. Patient taking differently: Take 100 mg by mouth at bedtime.  03/25/18  Yes [place], Betty G, MD  amLODipine (NORVASC) 5 MG tablet Take 1 tablet (5 mg total) by mouth daily. 04/13/18   Linwood Dibbles, MD    Family History Family History  Problem Relation Age of Onset  . Cancer Mother        uterine and ovarian  . Hypertension Mother   . Varicose Veins Mother   . Thyroid disease Mother   . Hyperlipidemia Mother   . Mental illness  Mother   . Tuberculosis Sister   . Heart disease Maternal Grandmother   . Varicose Veins Maternal Grandfather   . Mental illness Maternal Grandfather   . Bipolar disorder Paternal Grandfather   . Cancer Paternal Grandmother     Social History Social History   Tobacco Use  . Smoking status: Former Smoker    Types: Cigarettes    Last attempt to quit: 02/25/2008    Years since quitting: 10.1  . Smokeless tobacco: Never Used  Substance Use Topics  . Alcohol use: Yes    Comment: social  . Drug use: No     Allergies   Ramipril   Review of Systems Review of Systems  All other systems reviewed and are negative.    Physical Exam Updated Vital Signs BP (!) 187/113 (BP Location: Right Arm) Comment: informed the nurse of b/p  Pulse 94   Temp 98 F (36.7 C) (Oral)   Resp 19   Ht 1.676 m (5\' 6" )   Wt 100 kg   LMP 03/03/2018 (LMP Unknown)   SpO2 100%   BMI 35.57 kg/m   Physical Exam  Constitutional: She is oriented to person, place, and time. She appears well-developed and well-nourished. No distress.  HENT:  Head: Normocephalic and atraumatic.  Right Ear: External ear normal.  Left Ear: External ear normal.  Mouth/Throat: Oropharynx is clear and moist.  Eyes: Conjunctivae are normal. Right eye exhibits no discharge. Left eye exhibits no discharge. No scleral icterus.  Neck: Neck supple. No tracheal deviation present.  Cardiovascular: Normal rate, regular rhythm and intact distal pulses.  Pulmonary/Chest: Effort normal and breath sounds normal. No stridor. No respiratory distress. She has no wheezes. She has no rales.  Abdominal: Soft. Bowel sounds are normal. She exhibits no distension. There is no tenderness. There is no rebound and no guarding.  Musculoskeletal: She exhibits no edema or tenderness.  Neurological: She is alert and oriented to person, place, and time. No cranial nerve deficit (No facial droop, extraocular movements intact, tongue midline ) or sensory  deficit. She exhibits normal muscle tone. She displays no seizure activity. Coordination normal.  No pronator drift bilateral upper extrem, able to hold both legs off bed for 5 seconds, sensation intact in all extremities, no visual field cuts, no left or right sided neglect, normal finger-nose exam bilaterally, no nystagmus noted, grip strength in left hand is weaker than the right   Skin: Skin is warm and dry. No rash noted.  Psychiatric: She has a normal mood and affect.  Nursing note and vitals reviewed.    ED Treatments / Results  Labs (all labs ordered are listed, but only abnormal results are displayed) Labs Reviewed  BASIC METABOLIC PANEL - Abnormal; Notable for the following components:      Result Value   Chloride 114 (*)    CO2 21 (*)    Glucose, Bld 100 (*)    All other components within normal limits  CBC     Radiology Mr Brain Wo Contrast  Result Date: 04/13/2018 CLINICAL DATA:  37 y/o F; headache associated with orgasm. Worsening headaches with intermittent left-sided numbness and tingling. Follow-up exam. EXAM: MRI HEAD WITHOUT CONTRAST TECHNIQUE: Multiplanar, multiecho pulse sequences of the brain and surrounding structures were obtained without intravenous contrast. COMPARISON:  04/03/2018 MRI head. FINDINGS: Brain: No acute infarction, hemorrhage, hydrocephalus, extra-axial collection or mass lesion. Stable tiny T2 hyperintense focus within right frontal white matter within normal limits for age. No additional structural or signal abnormality of the brain. Vascular: Normal flow voids. Skull and upper cervical spine: Normal marrow signal. Sinuses/Orbits: Negative. Other: None. IMPRESSION: No acute intracranial abnormality. Stable normal MRI of the brain for age. Electronically Signed   By: Mitzi Hansen M.D.   On: 04/13/2018 23:18    Procedures Procedures (including critical care time)  Medications Ordered in ED Medications  prochlorperazine (COMPAZINE)  injection 10 mg (10 mg Intravenous Given 04/13/18 1944)  diphenhydrAMINE (BENADRYL) injection 25 mg (25 mg Intravenous Given 04/13/18 1944)  dexamethasone (DECADRON) injection 10 mg (10 mg Intravenous Given 04/13/18 1944)  labetalol (NORMODYNE,TRANDATE) injection 20 mg (20 mg Intravenous Given 04/13/18 2116)  labetalol (NORMODYNE,TRANDATE) injection 20 mg (20 mg Intravenous Given 04/13/18 2319)     Initial Impression / Assessment and Plan / ED Course  I have reviewed the triage vital signs and the nursing notes.  Pertinent labs & imaging results that were available during my care of the patient were reviewed by me and considered in my medical decision making (see chart for details).  Clinical Course as of Apr 13 2332  Wed Apr 13, 2018  1927 Blood pressure is extremely elevated today.  Patient states she does not have a history of hypertension and generally does not take blood pressure medications.  Earlier in the office today her blood pressure was normal   [JK]  2052 Patient remains hypertensive.  I will order a dose of labetalol.  She continues to feel numbness in her hand.  Will MRI brain   [JK]  2310 BP improved initially but elevated again.  WIll give another dose of labetalol.  Interestingly, pt's bp was normal at the doctors office.   [JK]    Clinical Course User Index [JK] Linwood Dibbles, MD    Patient presented to the emergency room for evaluation of numbness and headache.  Patient saw the primary care doctor and was started on medications for migraines.  Patient developed new neurologic symptoms so she was sent to the emergency room to get a repeat MRI.  Patient did have some mild upper extremity weakness on exam.  I suspect she is having a complex migraine as MRI did not show any acute abnormalities.  Patient was noted to be very hypertensive here in the emergency room.  Interestingly, her blood pressure was normal when she was at the doctor's office.  Patient states she has a history of  having high blood pressure when she is at a hospital.  She thinks is related to the stress of being here.  Regardless, she was treated with antihypertensive agents.  Patient will need to have her blood pressure rechecked by her primary care doctor.  I doubt that she is having  a hypertensive emergency but if she has persistent episodes of intermittent hypertension she may need further work-up for something such as a pheochromocytoma.  Give the patient a prescription for Norvasc.  I asked her to check her blood pressure tomorrow.  Follow up with PCP  Final Clinical Impressions(s) / ED Diagnoses   Final diagnoses:  Numbness  Hypertension, unspecified type  Migraine with status migrainosus, not intractable, unspecified migraine type    ED Discharge Orders         Ordered    amLODipine (NORVASC) 5 MG tablet  Daily     04/13/18 2332           Linwood Dibbles, MD 04/13/18 2336

## 2018-04-13 NOTE — Discharge Instructions (Signed)
Follow-up with your primary care doctor to have your blood pressure rechecked, take your blood pressure tomorrow and start the blood pressure medication if your blood pressure is greater than 140/90

## 2018-04-13 NOTE — Progress Notes (Addendum)
ACUTE VISIT   HPI:  Chief Complaint  Patient presents with  . Numbness    in left arm since last night  . Tingling    in both feet that started this morning    [name] is a 37 y.o. female, who is here today complaining of sudden onset of tingling and numbness. I saw her on 03/25/2018, when she was complaining of severe headaches associated with sex intercourse. She was also reporting tingling on both hands, upper extremities muscle spasms, no focal weakness or cervical pain.  Brain MRI done on 04/03/2018 was otherwise normal for her age, reports solitary small hyperintensity right frontal white matter. Neurology appointment is pending.  She has history of migraines, for the past year she has had more frequent left-sided temporal frontal headaches,associated photophobia and nausea.  She was on amitriptyline, which was helping until a few weeks ago, so she discontinued.  Last visit Topamax 50 mg was started, she increased dose to 100 mg a couple days ago. Last night she noted numbness and decreased sensation in left arm, she could not feel touch or pinch.  Occasionally pain needle sensation. No associated arthralgias, local edema or erythema, or rash.  She has had constant headache for the past few days, 3/10, associated intermittent nausea.  It seems to be aggravated by allergies (nasal congestion/sinus pressure).  She has not noted focal deficit. Today she noted tingling on feet. No cervical or lumbar pain. No MS changes.  History of hypothyroidism, currently she is on levothyroxine 100 mcg daily. She has not noted tremor, abnormal weight loss, palpitations, or diarrhea. Lab Results  Component Value Date   TSH 3.63 07/19/2017     Review of Systems  Constitutional: Negative for activity change, appetite change, fatigue and fever.  HENT: Negative for mouth sores, nosebleeds and trouble swallowing.   Eyes: Positive for photophobia. Negative for redness  and visual disturbance.  Respiratory: Negative for cough, shortness of breath and wheezing.   Cardiovascular: Negative for chest pain, palpitations and leg swelling.  Gastrointestinal: Positive for nausea. Negative for abdominal pain and vomiting.       Negative for changes in bowel habits.  Endocrine: Negative for cold intolerance and heat intolerance.  Genitourinary: Negative for decreased urine volume and hematuria.  Musculoskeletal: Negative for gait problem and myalgias.  Skin: Negative for pallor and rash.  Allergic/Immunologic: Positive for environmental allergies.  Neurological: Positive for numbness and headaches. Negative for tremors, seizures, syncope, facial asymmetry, speech difficulty and weakness.  Psychiatric/Behavioral: Negative for confusion. The patient is nervous/anxious.       Current Outpatient Medications on File Prior to Visit  Medication Sig Dispense Refill  . fluticasone (FLONASE) 50 MCG/ACT nasal spray Place 1 spray into both nostrils 2 (two) times daily. 16 g 3  . levothyroxine (SYNTHROID, LEVOTHROID) 100 MCG tablet Take 1 tablet (100 mcg total) by mouth daily before breakfast. 90 tablet 3  . OVER THE COUNTER MEDICATION Take 1 tablet by mouth daily as needed. Sleep aid    . topiramate (TOPAMAX) 50 MG tablet Take 1 tablet (50 mg total) by mouth at bedtime. (Patient taking differently: Take 100 mg by mouth at bedtime. ) 30 tablet 1   No current facility-administered medications on file prior to visit.      Past Medical History:  Diagnosis Date  . Anemia   . Anxiety    postpartum  . Depression    post partum  . Gestational diabetes  diet controlled  . Hypercalcemia   . Hyperlipidemia   . Hypertension    not on high blood pressure meds anymore  . Hypothyroidism   . PE (pulmonary embolism) 2004  . Postpartum care following vaginal delivery (11/15) 06/18/2014  . Thyroid disease    hypothyroidism  . Vitamin D deficiency    Allergies  Allergen  Reactions  . Ramipril Cough    Social History   Socioeconomic History  . Marital status: Married    Spouse name: Not on file  . Number of children: Not on file  . Years of education: Not on file  . Highest education level: Not on file  Occupational History  . Not on file  Social Needs  . Financial resource strain: Not on file  . Food insecurity:    Worry: Not on file    Inability: Not on file  . Transportation needs:    Medical: Not on file    Non-medical: Not on file  Tobacco Use  . Smoking status: Former Smoker    Types: Cigarettes    Last attempt to quit: 02/25/2008    Years since quitting: 10.1  . Smokeless tobacco: Never Used  Substance and Sexual Activity  . Alcohol use: Yes    Comment: social  . Drug use: No  . Sexual activity: Yes    Birth control/protection: None, Condom  Lifestyle  . Physical activity:    Days per week: Not on file    Minutes per session: Not on file  . Stress: Not on file  Relationships  . Social connections:    Talks on phone: Not on file    Gets together: Not on file    Attends religious service: Not on file    Active member of club or organization: Not on file    Attends meetings of clubs or organizations: Not on file    Relationship status: Not on file  Other Topics Concern  . Not on file  Social History Narrative  . Not on file    Vitals:   04/13/18 1629  BP: 130/80  Pulse: 100  Resp: 12  Temp: 98.4 F (36.9 C)  SpO2: 99%   Body mass index is 35.57 kg/m.   Physical Exam  Nursing note and vitals reviewed. Constitutional: She is oriented to person, place, and time. She appears well-developed. No distress.  HENT:  Head: Normocephalic and atraumatic.  Mouth/Throat: Oropharynx is clear and moist and mucous membranes are normal.  Eyes: Pupils are equal, round, and reactive to light. Conjunctivae are normal.  Cardiovascular: Normal rate and regular rhythm.  No murmur heard. Pulses:      Dorsalis pedis pulses are 2+  on the right side, and 2+ on the left side.  Respiratory: Effort normal and breath sounds normal. No respiratory distress.  GI: Soft. She exhibits no mass. There is no hepatomegaly. There is no tenderness.  Musculoskeletal: She exhibits no edema.  Lymphadenopathy:    She has no cervical adenopathy.  Neurological: She is alert and oriented to person, place, and time. She displays no tremor. A sensory deficit is present. No cranial nerve deficit. She exhibits normal muscle tone. Gait normal.  Reflex Scores:      Bicep reflexes are 2+ on the right side and 2+ on the left side.      Patellar reflexes are 2+ on the right side and 2+ on the left side. Pos pronator drift left. Decreased strength left hand 4/5. No sensation to light touch LUE  from mid arm to mid forearm.Decreased sensation anterior aspect wrist and inner aspect of forearm. Deficit affecting different dermatomas.  Skin: Skin is warm. No rash noted. No erythema.  Psychiatric: Her mood appears anxious.  Well groomed, good eye contact.      ASSESSMENT AND PLAN:   Ms. Chanler was seen today for numbness and tingling.  Diagnoses and all orders for this visit:  Numbness and tingling  Recommend decreasing Topamax dose from 100 mg to 50 mg.  -     Ambulatory referral to Neurology  Nonintractable headache, unspecified chronicity pattern, unspecified headache type  Intensity of headache has improved but now it is constant.  -     MR Brain Wo Contrast; Future  Progressive focal motor weakness  We went through brain MRI results. We discussed possible etiologies. Topamax certainly could explain tingling and numbness but not neurologic abnormalities found on physical examination today. She has not heard about neurology appointment.  We tried to arrange stat brain MRI to compare with the one done a few days ago but was not possible.  Given the fact symptoms seem to be progressing rapidly I think she needs to have neurologic  evaluation and/or stat MRI, so I recommend going to the ER.     Return if symptoms worsen or fail to improve, for Sent to the ER.      Haily Caley G. [place], MD  [facility]. [place] office.

## 2018-04-13 NOTE — ED Notes (Signed)
Pt is in MRI will get vitals when pt return 

## 2018-04-13 NOTE — Patient Instructions (Addendum)
A few things to remember from today's visit:   Numbness and tingling - Plan: Ambulatory referral to Neurology  Nonintractable headache, unspecified chronicity pattern, unspecified headache type - Plan: MR Brain Wo Contrast  Progressive focal motor weakness Because we have not be able to arrange a stat MRI for today to compare with your last one, I am sending you to the ER today.   Please be sure medication list is accurate. If a new problem present, please set up appointment sooner than planned today.

## 2018-04-13 NOTE — ED Triage Notes (Signed)
Pt states that she has had migraines on and off for a while. Pt went to PCP and had a MRI done a few days ago. Pt was referred to neurology but couldn't get in today. Pt states she took topamax, which relieved headaches some. Pt states that today she had pins and needles in left arm and leg. Pt then states it spread to her right hand.

## 2018-04-13 NOTE — Telephone Encounter (Signed)
Per [facility] "Rejection Reason - Unsupported service - pt needs to see a neurologists for headaches please advise

## 2018-04-13 NOTE — Telephone Encounter (Signed)
Patient has numbness and tingling just above the left elbow that started last evening. Denies CP/SOB/Radiating pain/weakness.Upon getting up this morning, both feet have the same numbness and tingling she feels in the left arm. Denies any swelling/redness. Reports she has a headache today and most days, started taking Topamax 50 MG on 03/25/18 for migraines. She recently increased to 100 MG for the headaches.Stated the medication causes her to feel slightly "off" in the head. Also reporting she is having difficulty making an appointment from the neurology referral that Dr.Jordan placed in August. Appointment made for this afternoon at 4:15p Reason for Disposition . [1] Numbness or tingling on both sides of body AND [2] is a new symptom present < 24 hours  Answer Assessment - Initial Assessment Questions 1. SYMPTOM: "What is the main symptom you are concerned about?" (e.g., weakness, numbness)     Lt arm, just above the elbow feels numb and tingly. 2. ONSET: "When did this start?" (minutes, hours, days; while sleeping)     Yesterday. 3. LAST NORMAL: "When was the last time you were normal (no symptoms)?"     Yesterday during the day. 4. PATTERN "Does this come and go, or has it been constant since it started?"  "Is it present now?"    Constant and yes present now. 5. CARDIAC SYMPTOMS: "Have you had any of the following symptoms: chest pain, difficulty breathing, palpitations?"    none 6. NEUROLOGIC SYMPTOMS: "Have you had any of the following symptoms: headache, dizziness, vision loss, double vision, changes in speech, unsteady on your feet?"     headache 7. OTHER SYMPTOMS: "Do you have any other symptoms?"     Numbness and tingling in both feet.Started this morning.  8. PREGNANCY: "Is there any chance you are pregnant?" "When was your last menstrual period?"     no  Protocols used: NEUROLOGIC DEFICIT-A-AH

## 2018-04-14 ENCOUNTER — Encounter: Payer: Self-pay | Admitting: Neurology

## 2018-04-15 NOTE — Telephone Encounter (Signed)
This is a patient I told her about (I think I also sent you a internal message), I placed neurosurgeon referral but I meant neurology referral. I thought appointment was being arranged, so I did not change referral. I just place a referral because she presented here with left upper extremity numbness.  Thanks, BJ

## 2018-04-20 ENCOUNTER — Encounter: Payer: Self-pay | Admitting: Family Medicine

## 2018-04-20 ENCOUNTER — Ambulatory Visit: Payer: BC Managed Care – PPO | Admitting: Family Medicine

## 2018-04-20 VITALS — BP 170/120 | HR 99 | Temp 98.5°F | Resp 12 | Ht 66.0 in | Wt 217.2 lb

## 2018-04-20 DIAGNOSIS — R200 Anesthesia of skin: Secondary | ICD-10-CM

## 2018-04-20 DIAGNOSIS — I10 Essential (primary) hypertension: Secondary | ICD-10-CM

## 2018-04-20 DIAGNOSIS — R51 Headache: Secondary | ICD-10-CM | POA: Diagnosis not present

## 2018-04-20 DIAGNOSIS — R519 Headache, unspecified: Secondary | ICD-10-CM

## 2018-04-20 MED ORDER — HYDROCHLOROTHIAZIDE 25 MG PO TABS: 25.0000 mg | ORAL_TABLET | Freq: Every day | ORAL | 0 refills | Status: DC

## 2018-04-20 NOTE — Patient Instructions (Addendum)
A few things to remember from today's visit:   Essential hypertension - Plan: hydrochlorothiazide (HYDRODIURIL) 25 MG tablet  Headache, unspecified headache type   DASH Eating Plan DASH stands for "Dietary Approaches to Stop Hypertension." The DASH eating plan is a healthy eating plan that has been shown to reduce high blood pressure (hypertension). It may also reduce your risk for type 2 diabetes, heart disease, and stroke. The DASH eating plan may also help with weight loss. What are tips for following this plan? General guidelines  Avoid eating more than 2,300 mg (milligrams) of salt (sodium) a day. If you have hypertension, you may need to reduce your sodium intake to 1,500 mg a day.  Limit alcohol intake to no more than 1 drink a day for nonpregnant women and 2 drinks a day for men. One drink equals 12 oz of beer, 5 oz of wine, or 1 oz of hard liquor.  Work with your health care provider to maintain a healthy body weight or to lose weight. Ask what an ideal weight is for you.  Get at least 30 minutes of exercise that causes your heart to beat faster (aerobic exercise) most days of the week. Activities may include walking, swimming, or biking.  Work with your health care provider or diet and nutrition specialist (dietitian) to adjust your eating plan to your individual calorie needs. Reading food labels  Check food labels for the amount of sodium per serving. Choose foods with less than 5 percent of the Daily Value of sodium. Generally, foods with less than 300 mg of sodium per serving fit into this eating plan.  To find whole grains, look for the word "whole" as the first word in the ingredient list. Shopping  Buy products labeled as "low-sodium" or "no salt added."  Buy fresh foods. Avoid canned foods and premade or frozen meals. Cooking  Avoid adding salt when cooking. Use salt-free seasonings or herbs instead of table salt or sea salt. Check with your health care provider  or pharmacist before using salt substitutes.  Do not fry foods. Cook foods using healthy methods such as baking, boiling, grilling, and broiling instead.  Cook with heart-healthy oils, such as olive, canola, soybean, or sunflower oil. Meal planning   Eat a balanced diet that includes: ? 5 or more servings of fruits and vegetables each day. At each meal, try to fill half of your plate with fruits and vegetables. ? Up to 6-8 servings of whole grains each day. ? Less than 6 oz of lean meat, poultry, or fish each day. A 3-oz serving of meat is about the same size as a deck of cards. One egg equals 1 oz. ? 2 servings of low-fat dairy each day. ? A serving of nuts, seeds, or beans 5 times each week. ? Heart-healthy fats. Healthy fats called Omega-3 fatty acids are found in foods such as flaxseeds and coldwater fish, like sardines, salmon, and mackerel.  Limit how much you eat of the following: ? Canned or prepackaged foods. ? Food that is high in trans fat, such as fried foods. ? Food that is high in saturated fat, such as fatty meat. ? Sweets, desserts, sugary drinks, and other foods with added sugar. ? Full-fat dairy products.  Do not salt foods before eating.  Try to eat at least 2 vegetarian meals each week.  Eat more home-cooked food and less restaurant, buffet, and fast food.  When eating at a restaurant, ask that your food be prepared with  less salt or no salt, if possible. What foods are recommended? The items listed may not be a complete list. Talk with your dietitian about what dietary choices are best for you. Grains Whole-grain or whole-wheat bread. Whole-grain or whole-wheat pasta. Brown rice. [place]. Bulgur. Whole-grain and low-sodium cereals. Pita bread. Low-fat, low-sodium crackers. Whole-wheat flour tortillas. Vegetables Fresh or frozen vegetables (raw, steamed, roasted, or grilled). Low-sodium or reduced-sodium tomato and vegetable juice. Low-sodium or  reduced-sodium tomato sauce and tomato paste. Low-sodium or reduced-sodium canned vegetables. Fruits All fresh, dried, or frozen fruit. Canned fruit in natural juice (without added sugar). Meat and other protein foods Skinless chicken or [place]. Ground chicken or [place]. Pork with fat trimmed off. Fish and seafood. Egg whites. Dried beans, peas, or lentils. Unsalted nuts, nut butters, and seeds. Unsalted canned beans. Lean cuts of beef with fat trimmed off. Low-sodium, lean deli meat. Dairy Low-fat (1%) or fat-free (skim) milk. Fat-free, low-fat, or reduced-fat cheeses. Nonfat, low-sodium ricotta or cottage cheese. Low-fat or nonfat yogurt. Low-fat, low-sodium cheese. Fats and oils Soft margarine without trans fats. Vegetable oil. Low-fat, reduced-fat, or light mayonnaise and salad dressings (reduced-sodium). Canola, safflower, olive, soybean, and sunflower oils. Avocado. Seasoning and other foods Herbs. Spices. Seasoning mixes without salt. Unsalted popcorn and pretzels. Fat-free sweets. What foods are not recommended? The items listed may not be a complete list. Talk with your dietitian about what dietary choices are best for you. Grains Baked goods made with fat, such as croissants, muffins, or some breads. Dry pasta or rice meal packs. Vegetables Creamed or fried vegetables. Vegetables in a cheese sauce. Regular canned vegetables (not low-sodium or reduced-sodium). Regular canned tomato sauce and paste (not low-sodium or reduced-sodium). Regular tomato and vegetable juice (not low-sodium or reduced-sodium). Angie Fava. Olives. Fruits Canned fruit in a light or heavy syrup. Fried fruit. Fruit in cream or butter sauce. Meat and other protein foods Fatty cuts of meat. Ribs. Fried meat. Berniece Salines. Sausage. Bologna and other processed lunch meats. Salami. Fatback. Hotdogs. Bratwurst. Salted nuts and seeds. Canned beans with added salt. Canned or smoked fish. Whole eggs or egg yolks. Chicken or [place]  with skin. Dairy Whole or 2% milk, cream, and half-and-half. Whole or full-fat cream cheese. Whole-fat or sweetened yogurt. Full-fat cheese. Nondairy creamers. Whipped toppings. Processed cheese and cheese spreads. Fats and oils Butter. Stick margarine. Lard. Shortening. Ghee. Bacon fat. Tropical oils, such as coconut, palm kernel, or palm oil. Seasoning and other foods Salted popcorn and pretzels. Onion salt, garlic salt, seasoned salt, table salt, and sea salt. Worcestershire sauce. Tartar sauce. Barbecue sauce. Teriyaki sauce. Soy sauce, including reduced-sodium. Steak sauce. Canned and packaged gravies. Fish sauce. Oyster sauce. Cocktail sauce. Horseradish that you find on the shelf. Ketchup. Mustard. Meat flavorings and tenderizers. Bouillon cubes. Hot sauce and Tabasco sauce. Premade or packaged marinades. Premade or packaged taco seasonings. Relishes. Regular salad dressings. Where to find more information:  National Heart, Lung, and [place]: https://wilson-eaton.com/  American Heart Association: www.heart.org Summary  The DASH eating plan is a healthy eating plan that has been shown to reduce high blood pressure (hypertension). It may also reduce your risk for type 2 diabetes, heart disease, and stroke.  With the DASH eating plan, you should limit salt (sodium) intake to 2,300 mg a day. If you have hypertension, you may need to reduce your sodium intake to 1,500 mg a day.  When on the DASH eating plan, aim to eat more fresh fruits and vegetables, whole grains, lean proteins,  low-fat dairy, and heart-healthy fats.  Work with your health care provider or diet and nutrition specialist (dietitian) to adjust your eating plan to your individual calorie needs. This information is not intended to replace advice given to you by your health care provider. Make sure you discuss any questions you have with your health care provider. Document Released: 07/09/2011 Document Revised: 07/13/2016  Document Reviewed: 07/13/2016 Elsevier Interactive Patient Education  [employer]2018 Elsevier Inc.  Please be sure medication list is accurate. If a new problem present, please set up appointment sooner than planned today.

## 2018-04-20 NOTE — Assessment & Plan Note (Addendum)
Re-checked 158/100. Not well controlled. Possible complications of elevated BP discussed. She will continue amlodipine 5 mg. HCTZ 25 mg was added today. Instructed to continue to monitor BP at home. Low-salt-DASH diet recommended as well as K+ containing foods. Side effects of medication discussed.  Follow-up in 4 weeks.

## 2018-04-20 NOTE — Progress Notes (Signed)
HPI:   Jennifer Mcmahon is a 37 y.o. female, who is here today to follow on recent ER visit on 04/13/18. She was seen from the office because left-sided weakness and abnormal neurologic examination.  Symptoms have improved after she decreased Topamax dose for 100 mg to 50 mg. Headache is still daily but stable.  Appt with euro is pending.  Brain MRI 04/13/18:  No acute intracranial abnormality. Stable normal MRI of the brain for age.  Brain MRI 04/03/18:  Solitary small hyperintensity right frontal white matter otherwise within normal limits. Within normal limits for age.  BP was elevated in the ER, 187/113. Hx of HTN,it has been well controlled with non pharmacologic treatment.  Home BP's since ER visit: 192/113,166/116,168/112,173/113, and 161/110.  She was started on Amlodipine 5 mg daily. Denies visual changes, chest pain, dyspnea, palpitation, claudication, focal weakness, or edema.  Tolerating medication well,no side effects reported.   Lab Results  Component Value Date   CREATININE 0.93 04/13/2018   BUN 18 04/13/2018   NA 142 04/13/2018   K 3.6 04/13/2018   CL 114 (H) 04/13/2018   CO2 21 (L) 04/13/2018     Review of Systems  Constitutional: Positive for fatigue. Negative for activity change, appetite change and fever.  HENT: Negative for mouth sores, nosebleeds and trouble swallowing.   Eyes: Negative for redness and visual disturbance.  Respiratory: Negative for cough, shortness of breath and wheezing.   Cardiovascular: Negative for chest pain, palpitations and leg swelling.  Gastrointestinal: Negative for abdominal pain, nausea and vomiting.       Negative for changes in bowel habits.  Genitourinary: Negative for decreased urine volume and hematuria.  Neurological: Positive for numbness and headaches. Negative for seizures, syncope and weakness.  Psychiatric/Behavioral: Negative for confusion. The patient is nervous/anxious.       Current  Outpatient Medications on File Prior to Visit  Medication Sig Dispense Refill  . amLODipine (NORVASC) 5 MG tablet Take 1 tablet (5 mg total) by mouth daily. 14 tablet 0  . fluticasone (FLONASE) 50 MCG/ACT nasal spray Place 1 spray into both nostrils 2 (two) times daily. (Patient taking differently: Place 1 spray into both nostrils 2 (two) times daily as needed for allergies. ) 16 g 3  . levothyroxine (SYNTHROID, LEVOTHROID) 100 MCG tablet Take 1 tablet (100 mcg total) by mouth daily before breakfast. 90 tablet 3  . OVER THE COUNTER MEDICATION Take 1 tablet by mouth daily as needed. Sleep aid    . topiramate (TOPAMAX) 50 MG tablet Take 1 tablet (50 mg total) by mouth at bedtime. (Patient taking differently: Take 100 mg by mouth at bedtime. ) 30 tablet 1   No current facility-administered medications on file prior to visit.      Past Medical History:  Diagnosis Date  . Anemia   . Anxiety    postpartum  . Depression    post partum  . Gestational diabetes    diet controlled  . Hypercalcemia   . Hyperlipidemia   . Hypertension    not on high blood pressure meds anymore  . Hypothyroidism   . PE (pulmonary embolism) 2004  . Postpartum care following vaginal delivery (11/15) 06/18/2014  . Thyroid disease    hypothyroidism  . Vitamin D deficiency    Allergies  Allergen Reactions  . Ramipril Cough    Social History   Socioeconomic History  . Marital status: Married    Spouse name: Not on file  .  Number of children: Not on file  . Years of education: Not on file  . Highest education level: Not on file  Occupational History  . Not on file  Social Needs  . Financial resource strain: Not on file  . Food insecurity:    Worry: Not on file    Inability: Not on file  . Transportation needs:    Medical: Not on file    Non-medical: Not on file  Tobacco Use  . Smoking status: Former Smoker    Types: Cigarettes    Last attempt to quit: 02/25/2008    Years since quitting: 10.1  .  Smokeless tobacco: Never Used  Substance and Sexual Activity  . Alcohol use: Yes    Comment: social  . Drug use: No  . Sexual activity: Yes    Birth control/protection: None, Condom  Lifestyle  . Physical activity:    Days per week: Not on file    Minutes per session: Not on file  . Stress: Not on file  Relationships  . Social connections:    Talks on phone: Not on file    Gets together: Not on file    Attends religious service: Not on file    Active member of club or organization: Not on file    Attends meetings of clubs or organizations: Not on file    Relationship status: Not on file  Other Topics Concern  . Not on file  Social History Narrative  . Not on file    Vitals:   04/20/18 1001  BP: (!) 170/120  Pulse: 99  Resp: 12  Temp: 98.5 F (36.9 C)  SpO2: 99%   Body mass index is 35.07 kg/m.   Physical Exam  Nursing note and vitals reviewed. Constitutional: She is oriented to person, place, and time. She appears well-developed. No distress.  HENT:  Head: Normocephalic and atraumatic.  Mouth/Throat: Oropharynx is clear and moist and mucous membranes are normal.  Eyes: Pupils are equal, round, and reactive to light. Conjunctivae and EOM are normal.  Cardiovascular: Normal rate and regular rhythm.  No murmur heard. Pulses:      Dorsalis pedis pulses are 2+ on the right side, and 2+ on the left side.  Respiratory: Effort normal and breath sounds normal. No respiratory distress.  GI: Soft. She exhibits no mass. There is no hepatomegaly. There is no tenderness.  Musculoskeletal: She exhibits no edema.  Lymphadenopathy:    She has no cervical adenopathy.  Neurological: She is alert and oriented to person, place, and time. She has normal strength. No cranial nerve deficit. Gait normal.  Reflex Scores:      Patellar reflexes are 2+ on the right side and 2+ on the left side. Left UE slowly drops while extended with closed eyes. Sensation mildly diminished.  Skin:  Skin is warm. No rash noted. No erythema.  Psychiatric: She has a normal mood and affect.  Well groomed, good eye contact.    ASSESSMENT AND PLAN:  Ms.Mikenzi was seen today for er follow-up.  Diagnoses and all orders for this visit:   Left upper extremity numbness  Improved after decreasing dose of Topamax. Continue monitoring. Instructed about warning signs.  Headache, unspecified headache type Migraine and tension like headache. Daily headache has been a stable and better controlled with Topamax. She has not had episodes of migraine since her last visit. Appt with neuro has been arranged for 07/2018. Instructed about warning signs. Follow-up in 4 weeks.  HTN (hypertension) Re-checked 158/100.  Not well controlled. Possible complications of elevated BP discussed. She will continue amlodipine 5 mg. HCTZ 25 mg was added today. Instructed to continue to monitor BP at home. Low-salt-DASH diet recommended as well as K+ containing foods. Side effects of medication discussed.  Follow-up in 4 weeks.       Daizha Anand G. [place], MD  [facility]. [place] office.

## 2018-04-20 NOTE — Assessment & Plan Note (Addendum)
Migraine and tension like headache. Daily headache has been a stable and better controlled with Topamax. She has not had episodes of migraine since her last visit. Appt with neuro has been arranged for 07/2018. Instructed about warning signs. Follow-up in 4 weeks.

## 2018-04-23 ENCOUNTER — Encounter: Payer: Self-pay | Admitting: Family Medicine

## 2018-04-23 DIAGNOSIS — Z86711 Personal history of pulmonary embolism: Secondary | ICD-10-CM | POA: Insufficient documentation

## 2018-04-26 ENCOUNTER — Emergency Department (HOSPITAL_COMMUNITY)
Admission: EM | Admit: 2018-04-26 | Discharge: 2018-04-26 | Disposition: A | Discharge status: Home / Self Care | Payer: BC Managed Care – PPO | Attending: Emergency Medicine | Admitting: Emergency Medicine

## 2018-04-26 ENCOUNTER — Other Ambulatory Visit: Payer: Self-pay

## 2018-04-26 ENCOUNTER — Ambulatory Visit: Payer: Self-pay

## 2018-04-26 ENCOUNTER — Encounter (HOSPITAL_COMMUNITY): Payer: Self-pay

## 2018-04-26 ENCOUNTER — Emergency Department (HOSPITAL_COMMUNITY): Payer: BC Managed Care – PPO

## 2018-04-26 DIAGNOSIS — R0789 Other chest pain: Secondary | ICD-10-CM | POA: Diagnosis not present

## 2018-04-26 DIAGNOSIS — Z86711 Personal history of pulmonary embolism: Secondary | ICD-10-CM | POA: Diagnosis not present

## 2018-04-26 DIAGNOSIS — Z87891 Personal history of nicotine dependence: Secondary | ICD-10-CM | POA: Insufficient documentation

## 2018-04-26 DIAGNOSIS — Z79899 Other long term (current) drug therapy: Secondary | ICD-10-CM | POA: Diagnosis not present

## 2018-04-26 DIAGNOSIS — F419 Anxiety disorder, unspecified: Secondary | ICD-10-CM | POA: Diagnosis not present

## 2018-04-26 DIAGNOSIS — E039 Hypothyroidism, unspecified: Secondary | ICD-10-CM | POA: Diagnosis not present

## 2018-04-26 DIAGNOSIS — I10 Essential (primary) hypertension: Secondary | ICD-10-CM | POA: Diagnosis not present

## 2018-04-26 LAB — BASIC METABOLIC PANEL
ANION GAP: 12 (ref 5–15)
BUN: 10 mg/dL (ref 6–20)
CHLORIDE: 99 mmol/L (ref 98–111)
CO2: 25 mmol/L (ref 22–32)
Calcium: 10.8 mg/dL — ABNORMAL HIGH (ref 8.9–10.3)
Creatinine, Ser: 1.1 mg/dL — ABNORMAL HIGH (ref 0.44–1.00)
GFR calc Af Amer: >60 mL/min (ref >60)
GLUCOSE: 142 mg/dL — AB (ref 70–99)
POTASSIUM: 3.1 mmol/L — AB (ref 3.5–5.1)
SODIUM: 136 mmol/L (ref 135–145)

## 2018-04-26 LAB — I-STAT BETA HCG BLOOD, ED (MC, WL, AP ONLY): I-stat hCG, quantitative: <5 m[IU]/mL (ref <5)

## 2018-04-26 LAB — D-DIMER, QUANTITATIVE (NOT AT ARMC): D DIMER QUANT: 0.46 ug{FEU}/mL (ref 0.00–0.50)

## 2018-04-26 LAB — CBC
HCT: 40.7 % (ref 36.0–46.0)
HEMOGLOBIN: 13.5 g/dL (ref 12.0–15.0)
MCH: 28.2 pg (ref 26.0–34.0)
MCHC: 33.2 g/dL (ref 30.0–36.0)
MCV: 85.1 fL (ref 78.0–100.0)
PLATELETS: 233 10*3/uL (ref 150–400)
RBC: 4.78 MIL/uL (ref 3.87–5.11)
RDW: 11.9 % (ref 11.5–15.5)
WBC: 8.7 10*3/uL (ref 4.0–10.5)

## 2018-04-26 LAB — I-STAT TROPONIN, ED
TROPONIN I, POC: 0 ng/mL (ref 0.00–0.08)
TROPONIN I, POC: 0 ng/mL (ref 0.00–0.08)

## 2018-04-26 MED ORDER — CELECOXIB 200 MG PO CAPS: 200.0000 mg | ORAL_CAPSULE | Freq: Two times a day (BID) | ORAL | 0 refills | Status: DC

## 2018-04-26 MED ORDER — LORAZEPAM 2 MG/ML IJ SOLN
1.0000 mg | Freq: Once | INTRAMUSCULAR | Status: DC
  Filled 2018-04-26: qty 1

## 2018-04-26 MED ORDER — KETOROLAC TROMETHAMINE 30 MG/ML IJ SOLN
30.0000 mg | Freq: Once | INTRAMUSCULAR | Status: DC
  Filled 2018-04-26: qty 1

## 2018-04-26 MED ORDER — DIAZEPAM 5 MG PO TABS: 5.0000 mg | ORAL_TABLET | Freq: Two times a day (BID) | ORAL | 0 refills | Status: DC

## 2018-04-26 MED ORDER — KETOROLAC TROMETHAMINE 15 MG/ML IJ SOLN
30.0000 mg | Freq: Once | INTRAMUSCULAR | Status: AC
  Administered 2018-04-26: 30 mg via INTRAMUSCULAR

## 2018-04-26 MED ORDER — LORAZEPAM 2 MG/ML IJ SOLN
1.0000 mg | Freq: Once | INTRAMUSCULAR | Status: AC
  Administered 2018-04-26: 1 mg via INTRAMUSCULAR

## 2018-04-26 NOTE — Telephone Encounter (Signed)
Patient has arrived at [facility].

## 2018-04-26 NOTE — ED Notes (Signed)
Patient verbalizes understanding of medications and discharge instructions. No further questions at this time. VSS and patient ambulatory at discharge.   

## 2018-04-26 NOTE — Telephone Encounter (Signed)
Will monitor for ED arrival.  

## 2018-04-26 NOTE — ED Triage Notes (Addendum)
Pt states she has been having chest pain described as spasms X2 days. She states the pain has began radiating into her neck. She states she has also been struggling to manage her hypertension. Pt has hx of PE but states this does not feel similar.

## 2018-04-26 NOTE — ED Provider Notes (Signed)
Patient placed in Quick Look pathway, seen and evaluated   Chief Complaint: [place] pain and palpitations.   HPI:  Palpitations and "spasms" in her chest staring Sunday. Pain comes and goes and is about a 6/10 right now. The spasm in her chest is left sided and radiates to the left neck. Has nausea which is baseline for her. No dizziness.  She thinks it may be related to new HCTZ medication. She has had a PE In the past, is not currently on anticoagulation. No leg swelling. She has SOB on exertion, none while sitting. She has also had headaches intermittently.   ROS: No new leg swelling or calf tenderness. No pleuritic chest pain.   Physical Exam:   Gen: No distress  Neuro: Awake and Alert  Skin: Warm    Focused Exam: Tachycardic, regular rhythm. Lungs CTA.   Initiation of care has begun. The patient has been counseled on the process, plan, and necessity for staying for the completion/evaluation, and the remainder of the medical screening examination    Lawrence MarseillesShrosbree, Joanell Cressler J, PA-C 04/26/18 1445    Melene PlanFloyd, Dan, DO 04/26/18 2235

## 2018-04-26 NOTE — ED Provider Notes (Signed)
[facility] EM[facility]GENCY [facility] Provider Note   CSN: [id number] Arrival date & time: 04/26/18  1425     History   Chief Complaint Chief Complaint  Patient presents with  . Chest Pain    HPI Jennifer Mcmahon is a 37 y.o. female.  HPI  37 year old female with PMH significant for prior PE during pregnancy, hypothyroidism, anxiety, HTN who presents with chief complaint of chest pain.  Patient states of the last 3 days she has had waxing and waning chest pain.  Patient describes pain as a spasming in the left upper quadrant of her chest, rating up into her left side of her neck, nothing makes better, nothing makes it worse, so she with palpitations, denies diaphoresis, endorses intermittent nausea.  Patient endorses significant anxiety secondary to the pain and concrement hypertension.  Patient states for the last several weeks she has had difficult to control high blood pressure despite amlodipine and new addition of hydrochlorothiazide.  Home BP measurements show systolics of 175.  Patient denies new headache or changes in vision.  Patient states she chronically has a 4 out of 10 headache for which she is seen by outside provider.  Denies tobacco use.  Denies recent travel, peripheral edema, or history of cancer.   Past Medical History:  Diagnosis Date  . Anemia   . Anxiety    postpartum  . Depression    post partum  . Gestational diabetes    diet controlled  . Hypercalcemia   . Hyperlipidemia   . Hypertension    not on high blood pressure meds anymore  . Hypothyroidism   . PE (pulmonary embolism) 2004  . Postpartum care following vaginal delivery (11/15) 06/18/2014  . Thyroid disease    hypothyroidism  . Vitamin D deficiency     Patient Active Problem List   Diagnosis Date Noted  . Hx of pulmonary embolus-2005 while on OCP 04/23/2018  . Headache, unspecified headache type 07/19/2017  . Allergic rhinitis 01/01/2017  . BMI 33.0-33.9,adult 04/30/2016    . Postpartum care following vaginal delivery (11/15) 06/18/2014  . Gestational diabetes mellitus, class A1 06/17/2014  . Polyhydramnios in third trimester, antepartum 06/17/2014  . Hypothyroidism affecting pregnancy in third trimester, antepartum 06/17/2014  . Gestational hypertension without significant proteinuria in third trimester 06/17/2014  . Gestational hypertension with significant proteinuria, postpartum 06/17/2014  . Hypercalcemia 04/28/2011  . HTN (hypertension) 04/17/2011  . Hyperlipidemia 04/17/2011  . Hypothyroidism 04/17/2011    Past Surgical History:  Procedure Laterality Date  . CHOLECYSTECTOMY N/A 06/12/2016   Procedure: LAPAROSCOPIC CHOLECYSTECTOMY;  Surgeon: Axel FillerArmando Ramirez, MD;  Location: [facility] OR;  Service: General;  Laterality: N/A;  . EYE SURGERY     lasik  . WISDOM TOOTH EXTRACTION       OB History    Gravida  2   Para  2   Term  2   Preterm      AB      Living  2     SAB      TAB      Ectopic      Multiple  0   Live Births  2            Home Medications    Prior to Admission medications   Medication Sig Start Date End Date Taking? Authorizing Provider  amLODipine (NORVASC) 5 MG tablet Take 1 tablet (5 mg total) by mouth daily. 04/13/18   Linwood DibblesKnapp, Jon, MD  celecoxib (CELEBREX) 200 MG capsule Take  1 capsule (200 mg total) by mouth 2 (two) times daily. 04/26/18   Jacalyn Lefevre, MD  diazepam (VALIUM) 5 MG tablet Take 1 tablet (5 mg total) by mouth 2 (two) times daily. 04/26/18   Jacalyn Lefevre, MD  fluticasone (FLONASE) 50 MCG/ACT nasal spray Place 1 spray into both nostrils 2 (two) times daily. Patient taking differently: Place 1 spray into both nostrils 2 (two) times daily as needed for allergies.  01/01/17   [place], Betty G, MD  hydrochlorothiazide (HYDRODIURIL) 25 MG tablet Take 1 tablet (25 mg total) by mouth daily. 04/20/18   [place], Betty G, MD  levothyroxine (SYNTHROID, LEVOTHROID) 100 MCG tablet Take 1 tablet (100 mcg total) by  mouth daily before breakfast. 07/24/17   [place], Betty G, MD  OVER THE COUNTER MEDICATION Take 1 tablet by mouth daily as needed. Sleep aid    [provider]  topiramate (TOPAMAX) 50 MG tablet Take 1 tablet (50 mg total) by mouth at bedtime. Patient taking differently: Take 100 mg by mouth at bedtime.  03/25/18   [place], Betty G, MD    Family History Family History  Problem Relation Age of Onset  . Cancer Mother        uterine and ovarian  . Hypertension Mother   . Varicose Veins Mother   . Thyroid disease Mother   . Hyperlipidemia Mother   . Mental illness Mother   . Tuberculosis Sister   . Heart disease Maternal Grandmother   . Varicose Veins Maternal Grandfather   . Mental illness Maternal Grandfather   . Bipolar disorder Paternal Grandfather   . Cancer Paternal Grandmother     Social History Social History   Tobacco Use  . Smoking status: Former Smoker    Types: Cigarettes    Last attempt to quit: 02/25/2008    Years since quitting: 10.1  . Smokeless tobacco: Never Used  Substance Use Topics  . Alcohol use: Yes    Comment: social  . Drug use: No     Allergies   Ramipril   Review of Systems Review of Systems  Constitutional: Negative for chills and fever.  HENT: Negative for ear pain and sore throat.   Eyes: Negative for pain and visual disturbance.  Respiratory: Negative for cough and shortness of breath.   Cardiovascular: Positive for chest pain. Negative for palpitations.  Gastrointestinal: Negative for abdominal pain and vomiting.  Genitourinary: Negative for dysuria and hematuria.  Musculoskeletal: Positive for neck pain. Negative for arthralgias and back pain.  Skin: Negative for color change and rash.  Neurological: Negative for seizures and syncope.  All other systems reviewed and are negative.    Physical Exam Updated Vital Signs BP (!) 146/90   Pulse 93   Temp 98.5 F (36.9 C) (Oral)   Resp 20   SpO2 100%   Physical Exam    Constitutional: She is oriented to person, place, and time. She appears well-developed and well-nourished. No distress.  HENT:  Head: Normocephalic and atraumatic.  Eyes: Pupils are equal, round, and reactive to light. Conjunctivae and EOM are normal.  Neck: Neck supple.  Cardiovascular: Normal rate and regular rhythm.  No murmur heard. Pulmonary/Chest: Effort normal and breath sounds normal. No respiratory distress.  Abdominal: Soft. There is no tenderness.  Musculoskeletal: She exhibits no edema.       Back:  Neurological: She is alert and oriented to person, place, and time. GCS eye subscore is 4. GCS verbal subscore is 5. GCS motor subscore is 6.  Skin: Skin is warm and dry. Capillary refill takes less than 2 seconds.  Psychiatric: She has a normal mood and affect.  Nursing note and vitals reviewed.    ED Treatments / Results  Labs (all labs ordered are listed, but only abnormal results are displayed) Labs Reviewed  BASIC METABOLIC PANEL - Abnormal; Notable for the following components:      Result Value   Potassium 3.1 (*)    Glucose, Bld 142 (*)    Creatinine, Ser 1.10 (*)    Calcium 10.8 (*)    All other components within normal limits  CBC  D-DIMER, QUANTITATIVE (NOT AT [facility])  I-STAT BETA HCG BLOOD, ED ([facility], [facility], [facility] ONLY)  I-STAT TROPONIN, ED  I-STAT TROPONIN, ED    EKG EKG Interpretation  Date/Time:  Tuesday April 26 2018 14:40:11 EDT Ventricular Rate:  130 PR Interval:  144 QRS Duration: 72 QT Interval:  302 QTC Calculation: 444 R Axis:   41 Text Interpretation:  Sinus tachycardia Nonspecific ST and T wave abnormality Abnormal ECG No old tracing to compare Confirmed by Jacalyn Lefevre [phone]) on 04/26/2018 8:25:17 PM   Radiology Dg Chest 2 View  Result Date: 04/26/2018 CLINICAL DATA:  Chest pain and palpitations EXAM: CHEST - 2 VIEW COMPARISON:  None. FINDINGS: The heart size and mediastinal contours are within normal limits. Both lungs are clear. The  visualized skeletal structures are unremarkable. IMPRESSION: No active cardiopulmonary disease. Electronically Signed   By: Deatra Robinson M.D.   On: 04/26/2018 15:35    Procedures Procedures (including critical care time)  Medications Ordered in ED Medications  ketorolac (TORADOL) 15 MG/ML injection 30 mg (30 mg Intramuscular Given 04/26/18 2216)  LORazepam (ATIVAN) injection 1 mg (1 mg Intramuscular Given 04/26/18 2216)     Initial Impression / Assessment and Plan / ED Course  I have reviewed the triage vital signs and the nursing notes.  Pertinent labs & imaging results that were available during my care of the patient were reviewed by me and considered in my medical decision making (see chart for details).     37 year old female with PMH significant for prior PE during pregnancy, hypothyroidism, anxiety, HTN who presents with chief complaint of chest pain.  History as above.  Differential diagnosis includes PE, ACS, pneumothorax, PE, and muscle skeletal pain.  Labs and imaging performed.  Troponins negative x2, d-dimer negative, patient is not anemic.  Chest x-ray negative pneumonia, pneumothorax, widened mediastinum, or evidence of perforated viscus.  EKG consistent with priors, no signs of ST elevation or depression, normal intervals, normal axis, regular rate and rhythm.  Patient with likely muscular skeletal origin the pain due to muscle spasm nature of pain.  Advised antibiotic treatment, follow-up with PCP.  Strict return precautions given for change in character or quality of pain.  Vital signs stable, patient stable for discharge.  Final Clinical Impressions(s) / ED Diagnoses   Final diagnoses:  Other chest pain    ED Discharge Orders         Ordered    diazepam (VALIUM) 5 MG tablet  2 times daily     04/26/18 2157    celecoxib (CELEBREX) 200 MG capsule  2 times daily     04/26/18 2157           Margit Banda, MD 04/27/18 6045    Jacalyn Lefevre,  MD 04/27/18 [phone]

## 2018-04-26 NOTE — Telephone Encounter (Signed)
Pt. Called to report onset of intermittent palpitations that started on Sunday.  Reported she started HCTZ and attributed onset of heart palpitations to new medication. Questioned about chest pain associated with her increased HR; reported "my heart feels like it spasms and squeezes too hard", and starts on left chest and moves up into left neck, at times.  Stated she has had high BP, and with addition of HCTZ, it is not coming down.  Reported BP at 10:00 PM, 9/23 of 173/108.  Stated she is averaging BP's 160-170/110's.  Stated she has had headache and nausea for months.  Pulse checked per pt. x 2 during call; reported pulse 128 and irreg.; reported "some skipped beats."  Pulse check about 10 min. After 1st reading; 100 BPM; reported it felt more regular.  During call, stated "there goes my heart spasming again."  Pt. Described it felt like a spasm that started in the left sternum, and moved up to the collar bone.  Reported she has this intermittently, and lasts for several seconds.  Advised pt. She should go to ER for further evaluation, due to multiple complaints with heart palpitations, shortness of breath, chest spasms, and nausea.   Advised that another adult should drive her. Pt. Verb. Understanding.  Agreed with plan.          Reason for Disposition . Chest pain lasts > 5 minutes (Exceptions: chest pain occurring > 3 days ago and now asymptomatic; same as previously diagnosed heartburn and has accompanying sour taste in mouth)    Pt. Reported intermittent spasms in her left chest that radiate to collar bone and left neck with heart palpitations, since Sunday.  Reported it feels like my heart is squeezing too hard and I get short of breath with activity. . [1] Skipped or extra beat(s) AND [2] increases with exercise or exertion    C/o heart palpitations since Sunday; Pulse rate is 128 and irregular. C/o shortness of breath with walking.  Answer Assessment - Initial Assessment Questions 1. LOCATION:  "Where does it hurt?"       C/o feeling intermittent chest spasms in left chest that radiates up chest to neck 2. RADIATION: "Does the pain go anywhere else?" (e.g., into neck, jaw, arms, back)     See above 3. ONSET: "When did the chest pain begin?" (Minutes, hours or days)      Intermittent with onset of palpitations on Sunday.  4. PATTERN "Does the pain come and go, or has it been constant since it started?"  "Does it get worse with exertion?"      intermittent 5. DURATION: "How long does it last" (e.g., seconds, minutes, hours)     Lasts several seconds 6. SEVERITY: "How bad is the pain?"  (e.g., Scale 1-10; mild, moderate, or severe)    - MILD (1-3): doesn't interfere with normal activities     - MODERATE (4-7): interferes with normal activities or awakens from sleep    - SEVERE (8-10): excruciating pain, unable to do any normal activities       moderate 7. CARDIAC RISK FACTORS: "Do you have any history of heart problems or risk factors for heart disease?" (e.g., prior heart attack, angina; high blood pressure, diabetes, being overweight, high cholesterol, smoking, or strong family history of heart disease)     No previous personal heart hx.; grandmother had heart attack at young age 458. PULMONARY RISK FACTORS: "Do you have any history of lung disease?"  (e.g., blood clots in lung, asthma, emphysema,  birth control pills)     Not asked 9. CAUSE: "What do you think is causing the chest pain?"     Questioned if HCTZ is causing heart palpitations  10. OTHER SYMPTOMS: "Do you have any other symptoms?" (e.g., dizziness, nausea, vomiting, sweating, fever, difficulty breathing, cough)       Describes intermittent spasms in left chest and radiation up to collar bone and neck; shortness of breath 11. PREGNANCY: "Is there any chance you are pregnant?" "When was your last menstrual period?"       No; LMP 9/5  Answer Assessment - Initial Assessment Questions 1. DESCRIPTION: "Please describe your  heart rate or heart beat that you are having" (e.g., fast/slow, regular/irregular, skipped or extra beats, "palpitations")     Feels like it is beating faster and possibly skipping beats 2. ONSET: "When did it start?" (Minutes, hours or days)      Sunday evening 3. DURATION: "How long does it last" (e.g., seconds, minutes, hours)     Lasts about 30-60 min.; happens in the evening; sometimes associated with eating  4. PATTERN "Does it come and go, or has it been constant since it started?"  "Does it get worse with exertion?"   "Are you feeling it now?"     intermittent 5. TAP: "Using your hand, can you tap out what you are feeling on a chair or table in front of you, so that I can hear?" (Note: not all patients can do this)       n/a6. HEART RATE: "Can you tell me your heart rate?" "How many beats in 15 seconds?"  (Note: not all patients can do this)       Pulse rate is 128 and slightly irreg.  7. RECURRENT SYMPTOM: "Have you ever had this before?" If so, ask: "When was the last time?" and "What happened that time?"      Yes, has felt this with high blood pressure or under increased stress.  8. CAUSE: "What do you think is causing the palpitations?"     Questions if HCTZ is contributing to the heart palpitations; took HCTZ several years ago when she tried to take it for HBP 9. CARDIAC HISTORY: "Do you have any history of heart disease?" (e.g., heart attack, angina, bypass surgery, angioplasty, arrhythmia)      Denied any heart arrhythmia or condition 10. OTHER SYMPTOMS: "Do you have any other symptoms?" (e.g., dizziness, chest pain, sweating, difficulty breathing)       Reported chest discomfort "is like my heart feels like it squeezes too hard"; "it feels like a cramp or spasm", reported shortness of breath when trying to walk to lunch down the street; denied sweating, denied dizziness; c/o having headache for months , c/o nausea; feels some discomfort into the left neck at intervals 11.  PREGNANCY: "Is there any chance you are pregnant?" "When was your last menstrual period?"       No; LMP 9/5  Protocols used: CHEST PAIN-A-AH, HEART RATE AND HEARTBEAT QUESTIONS-A-AH

## 2018-05-01 NOTE — Progress Notes (Signed)
HPI:   [name] is a 37 y.o. female, who is here today to follow on recent ER.    She was seen in the ER on 04/26/18 due to 3 days of left-sided chest pain. EKG,troponins x 2,D-dimer in normal range. CXR no active cardiopulmonary disease.  She was discharged with a Rx for Valium 5 mg bid prn.  Hypertension:   Currently on Amlodipine 5 mg and HCTZ 25 mg daily.   She is taking medications as instructed, no side effects reported. She is eating a banana daily. She has not noted unusual headache, visual changes, exertional chest pain, dyspnea,  focal weakness, or edema.  Home BPs: 120s/90s.   Lab Results  Component Value Date   CREATININE 1.10 (H) 04/26/2018   BUN 10 04/26/2018   NA 136 04/26/2018   K 3.1 (L) 04/26/2018   CL 99 04/26/2018   CO2 25 04/26/2018   Chest pain has improved, "bruised" like sensation on upper left-sided chest. No associated dyspnea, diaphoresis, or palpitations.  Headache has also improved. She is on Topamax 50 mg daily.  Anxiety: She feels like most of her recent health issues are related to anxiety. History of postpartum depression.  She has been under some stress for the past year due to work, she also bought a house about a year ago. She denies depressed mood or suicidal thoughts. Valium 5 mg twice daily has helped tremendously. Racing thoughts at night, trouble falling asleep.  In the past she has been on lithium, which according with her, it was the medication that helped the most. Pristiq also help but she was not able to afford it when it was prescribed about 10 to 12 years ago.  Review of Systems  Constitutional: Negative for activity change, appetite change, fatigue and fever.  HENT: Negative for mouth sores, nosebleeds and trouble swallowing.   Eyes: Negative for redness and visual disturbance.  Respiratory: Negative for cough, shortness of breath and wheezing.   Cardiovascular: Negative for palpitations and  leg swelling.  Gastrointestinal: Negative for abdominal pain, nausea and vomiting.       Negative for changes in bowel habits.  Genitourinary: Negative for decreased urine volume and hematuria.  Neurological: Positive for headaches. Negative for syncope and weakness.  Psychiatric/Behavioral: Positive for sleep disturbance. Negative for confusion and hallucinations. The patient is nervous/anxious.       Current Outpatient Medications on File Prior to Visit  Medication Sig Dispense Refill  . amLODipine (NORVASC) 5 MG tablet Take 1 tablet (5 mg total) by mouth daily. 14 tablet 0  . celecoxib (CELEBREX) 200 MG capsule Take 1 capsule (200 mg total) by mouth 2 (two) times daily. 60 capsule 0  . fluticasone (FLONASE) 50 MCG/ACT nasal spray Place 1 spray into both nostrils 2 (two) times daily. (Patient taking differently: Place 1 spray into both nostrils 2 (two) times daily as needed for allergies. ) 16 g 3  . hydrochlorothiazide (HYDRODIURIL) 25 MG tablet Take 1 tablet (25 mg total) by mouth daily. 90 tablet 0  . levothyroxine (SYNTHROID, LEVOTHROID) 100 MCG tablet Take 1 tablet (100 mcg total) by mouth daily before breakfast. 90 tablet 3  . OVER THE COUNTER MEDICATION Take 1 tablet by mouth daily as needed. Sleep aid    . topiramate (TOPAMAX) 50 MG tablet Take 1 tablet (50 mg total) by mouth at bedtime. (Patient taking differently: Take 100 mg by mouth at bedtime. ) 30 tablet 1   No current facility-administered  medications on file prior to visit.      Past Medical History:  Diagnosis Date  . Anemia   . Anxiety    postpartum  . Depression    post partum  . Gestational diabetes    diet controlled  . Hypercalcemia   . Hyperlipidemia   . Hypertension    not on high blood pressure meds anymore  . Hypothyroidism   . PE (pulmonary embolism) 2004  . Postpartum care following vaginal delivery (11/15) 06/18/2014  . Thyroid disease    hypothyroidism  . Vitamin D deficiency    Allergies    Allergen Reactions  . Ramipril Cough    Social History   Socioeconomic History  . Marital status: Married    Spouse name: Not on file  . Number of children: Not on file  . Years of education: Not on file  . Highest education level: Not on file  Occupational History  . Not on file  Social Needs  . Financial resource strain: Not on file  . Food insecurity:    Worry: Not on file    Inability: Not on file  . Transportation needs:    Medical: Not on file    Non-medical: Not on file  Tobacco Use  . Smoking status: Former Smoker    Types: Cigarettes    Last attempt to quit: 02/25/2008    Years since quitting: 10.1  . Smokeless tobacco: Never Used  Substance and Sexual Activity  . Alcohol use: Yes    Comment: social  . Drug use: No  . Sexual activity: Yes    Birth control/protection: None, Condom  Lifestyle  . Physical activity:    Days per week: Not on file    Minutes per session: Not on file  . Stress: Not on file  Relationships  . Social connections:    Talks on phone: Not on file    Gets together: Not on file    Attends religious service: Not on file    Active member of club or organization: Not on file    Attends meetings of clubs or organizations: Not on file    Relationship status: Not on file  Other Topics Concern  . Not on file  Social History Narrative  . Not on file    Vitals:   05/02/18 0703  BP: 124/80  Pulse: 89  Resp: 12  Temp: 98 F (36.7 C)  SpO2: 99%   Body mass index is 34.54 kg/m.   Physical Exam  Nursing note and vitals reviewed. Constitutional: She is oriented to person, place, and time. She appears well-developed. No distress.  HENT:  Head: Normocephalic and atraumatic.  Mouth/Throat: Oropharynx is clear and moist and mucous membranes are normal.  Eyes: Pupils are equal, round, and reactive to light. Conjunctivae are normal.  Cardiovascular: Normal rate and regular rhythm.  No murmur heard. Pulses:      Dorsalis pedis pulses  are 2+ on the right side, and 2+ on the left side.  Respiratory: Effort normal and breath sounds normal. No respiratory distress.  GI: Soft. She exhibits no mass. There is no hepatomegaly. There is no tenderness.  Musculoskeletal: She exhibits no edema.  Lymphadenopathy:    She has no cervical adenopathy.  Neurological: She is alert and oriented to person, place, and time. She has normal strength. No cranial nerve deficit. Gait normal.  Skin: Skin is warm. No rash noted. No erythema.  Psychiatric: She has a normal mood and affect.  Well groomed,  good eye contact.    ASSESSMENT AND PLAN:  Jennifer Mcmahon was seen today for er follow-up.  Orders Placed This Encounter  Procedures  . Basic metabolic panel    Lab Results  Component Value Date   CREATININE 1.00 05/02/2018   BUN 20 05/02/2018   NA 138 05/02/2018   K 3.8 05/02/2018   CL 104 05/02/2018   CO2 24 05/02/2018    Hypokalemia  We discussed side effects of HCTZ. Depending of lab results we need to consider either adjusting HCTZ and/or adding potassium supplementation. Continue potassium containing diet.  -     Basic metabolic panel  HTN (hypertension) BP is better controlled. No changes in Amlodipine or HCTZ. Continue monitoring BP at home. Low-salt diet. High exam recommended every 1 to 2 years. Follow-up in 5 weeks.  Anxiety disorder Valium 5 mg twice daily has helped. No changes in valium dose but recommend continuing as needed twice daily. Effexor 37.5 mg added today. We discussed side effects of medications. Follow-up in 4 to 5 weeks.  Mood disorder (HCC) She has been diagnosed with bipolar disorder in the past. I do not feel comfortable at this time with starting her back on lithium. After discussion of other treatment options, she agrees with trying Abilify 2 mg daily. If needed we could change Abilify for lumbar change or Seroquel. Instructed about warning signs. Follow-up in 4 to 5 weeks, before if  needed.      Jennifer Milks G. [place], MD  [facility]. [place] office.

## 2018-05-02 ENCOUNTER — Ambulatory Visit: Payer: BC Managed Care – PPO | Admitting: Family Medicine

## 2018-05-02 ENCOUNTER — Encounter: Payer: Self-pay | Admitting: Family Medicine

## 2018-05-02 VITALS — BP 124/80 | HR 89 | Temp 98.0°F | Resp 12 | Ht 66.0 in | Wt 214.0 lb

## 2018-05-02 DIAGNOSIS — I10 Essential (primary) hypertension: Secondary | ICD-10-CM

## 2018-05-02 DIAGNOSIS — F319 Bipolar disorder, unspecified: Secondary | ICD-10-CM | POA: Insufficient documentation

## 2018-05-02 DIAGNOSIS — F39 Unspecified mood [affective] disorder: Secondary | ICD-10-CM | POA: Diagnosis not present

## 2018-05-02 DIAGNOSIS — E876 Hypokalemia: Secondary | ICD-10-CM | POA: Diagnosis not present

## 2018-05-02 DIAGNOSIS — F419 Anxiety disorder, unspecified: Secondary | ICD-10-CM

## 2018-05-02 LAB — BASIC METABOLIC PANEL
BUN: 20 mg/dL (ref 6–23)
CALCIUM: 10 mg/dL (ref 8.4–10.5)
CO2: 24 meq/L (ref 19–32)
CREATININE: 1 mg/dL (ref 0.40–1.20)
Chloride: 104 mEq/L (ref 96–112)
GFR: 65.52 mL/min (ref >60.00)
Glucose, Bld: 109 mg/dL — ABNORMAL HIGH (ref 70–99)
Potassium: 3.8 mEq/L (ref 3.5–5.1)
SODIUM: 138 meq/L (ref 135–145)

## 2018-05-02 MED ORDER — ARIPIPRAZOLE 2 MG PO TABS: 2.0000 mg | ORAL_TABLET | Freq: Every day | ORAL | 1 refills | Status: DC

## 2018-05-02 MED ORDER — DIAZEPAM 5 MG PO TABS: 5.0000 mg | ORAL_TABLET | Freq: Two times a day (BID) | ORAL | 0 refills | Status: DC | PRN

## 2018-05-02 MED ORDER — VENLAFAXINE HCL ER 37.5 MG PO CP24: 37.5000 mg | ORAL_CAPSULE | Freq: Every day | ORAL | 1 refills | Status: DC

## 2018-05-02 NOTE — Assessment & Plan Note (Signed)
BP is better controlled. No changes in Amlodipine or HCTZ. Continue monitoring BP at home. Low-salt diet. High exam recommended every 1 to 2 years. Follow-up in 5 weeks.

## 2018-05-02 NOTE — Assessment & Plan Note (Signed)
She has been diagnosed with bipolar disorder in the past. I do not feel comfortable at this time with starting her back on lithium. After discussion of other treatment options, she agrees with trying Abilify 2 mg daily. If needed we could change Abilify for lumbar change or Seroquel. Instructed about warning signs. Follow-up in 4 to 5 weeks, before if needed.

## 2018-05-02 NOTE — Patient Instructions (Signed)
A few things to remember from today's visit:   Essential hypertension - Plan: Basic metabolic panel  Hypokalemia - Plan: Basic metabolic panel  Mood disorder (HCC) - Plan: ARIPiprazole (ABILIFY) 2 MG tablet  Anxiety disorder, unspecified type - Plan: venlafaxine XR (EFFEXOR XR) 37.5 MG 24 hr capsule, diazepam (VALIUM) 5 MG tablet  Continue Valium twice daily as needed. Effexor 37.5 mg added today. Abilify 2 mg daily. No changes in blood pressure medications.  Please be sure medication list is accurate. If a new problem present, please set up appointment sooner than planned today.

## 2018-05-02 NOTE — Assessment & Plan Note (Signed)
Valium 5 mg twice daily has helped. No changes in valium dose but recommend continuing as needed twice daily. Effexor 37.5 mg added today. We discussed side effects of medications. Follow-up in 4 to 5 weeks.

## 2018-05-13 ENCOUNTER — Other Ambulatory Visit: Payer: Self-pay | Admitting: Family Medicine

## 2018-05-13 DIAGNOSIS — R51 Headache: Principal | ICD-10-CM

## 2018-05-13 DIAGNOSIS — R519 Headache, unspecified: Secondary | ICD-10-CM

## 2018-05-20 ENCOUNTER — Ambulatory Visit: Payer: BC Managed Care – PPO | Admitting: Family Medicine

## 2018-06-06 ENCOUNTER — Ambulatory Visit: Payer: BC Managed Care – PPO | Admitting: Family Medicine

## 2018-06-06 ENCOUNTER — Encounter: Payer: Self-pay | Admitting: Family Medicine

## 2018-06-06 VITALS — BP 126/84 | HR 84 | Temp 98.8°F | Resp 12 | Ht 66.0 in | Wt 211.4 lb

## 2018-06-06 DIAGNOSIS — R519 Headache, unspecified: Secondary | ICD-10-CM

## 2018-06-06 DIAGNOSIS — F39 Unspecified mood [affective] disorder: Secondary | ICD-10-CM | POA: Diagnosis not present

## 2018-06-06 DIAGNOSIS — F411 Generalized anxiety disorder: Secondary | ICD-10-CM | POA: Diagnosis not present

## 2018-06-06 DIAGNOSIS — R51 Headache: Secondary | ICD-10-CM | POA: Diagnosis not present

## 2018-06-06 DIAGNOSIS — I10 Essential (primary) hypertension: Secondary | ICD-10-CM | POA: Diagnosis not present

## 2018-06-06 MED ORDER — VENLAFAXINE HCL ER 37.5 MG PO CP24: 37.5000 mg | ORAL_CAPSULE | Freq: Every day | ORAL | 1 refills | Status: DC

## 2018-06-06 MED ORDER — AMLODIPINE BESYLATE 5 MG PO TABS: 5.0000 mg | ORAL_TABLET | Freq: Every day | ORAL | 2 refills | Status: DC

## 2018-06-06 MED ORDER — HYDROCHLOROTHIAZIDE 25 MG PO TABS: 25.0000 mg | ORAL_TABLET | Freq: Every day | ORAL | 1 refills | Status: DC

## 2018-06-06 MED ORDER — ARIPIPRAZOLE 5 MG PO TABS: 5.0000 mg | ORAL_TABLET | Freq: Every day | ORAL | 3 refills | Status: DC

## 2018-06-06 NOTE — Patient Instructions (Addendum)
A few things to remember from today's visit:   Essential hypertension - Plan: hydrochlorothiazide (HYDRODIURIL) 25 MG tablet, amLODipine (NORVASC) 5 MG tablet  Generalized anxiety disorder  Anxiety disorder, unspecified type - Plan: venlafaxine XR (EFFEXOR XR) 37.5 MG 24 hr capsule  Headache, unspecified headache type  Today we increased Abilify from 2 mg to 5 mg. Rest of medications were no changes. Please let me know how you do with Abilify adjustment.  Please be sure medication list is accurate. If a new problem present, please set up appointment sooner than planned today.

## 2018-06-06 NOTE — Assessment & Plan Note (Signed)
Currently she is having a headache but otherwise problem is stable. She did not tolerate higher doses of Topamax, current dose seems to be helping. Pending appointment with neurologist. No changes in current management. Instructed about warning signs.

## 2018-06-06 NOTE — Assessment & Plan Note (Signed)
No major changes after starting Effexor XR 75 mg, for now no changes in current dose, we will wait 3-4 more weeks before we decide adjusting dose. No changes in Valium 5 mg daily as needed. Follow-up in 3 to 4 months, before if needed.

## 2018-06-06 NOTE — Assessment & Plan Note (Signed)
Insomnia mildly worse since she started Effexor. Recommend increasing Abilify from 2 mg to 5 mg. No changes in Effexor. Instructed about warning signs. Follow-up in 3 to 4 months, before if needed.

## 2018-06-06 NOTE — Progress Notes (Signed)
[name] is a 37 y.o.female, who is here today to follow on HTN and anxiety. She was last seen on 05/02/2017.  Hypertension currently on HCTZ 25 mg, she has not been taking amlodipine 5 mg since her last visit. No side effects reported. She is reporting improvement of BP readings but is still having some "little" elevated, 130s to 140s/90s.  She has not noted visual changes, exertional chest pain, dyspnea,  focal weakness, or edema.    Lab Results  Component Value Date   CREATININE 1.00 05/02/2018   BUN 20 05/02/2018   NA 138 05/02/2018   K 3.8 05/02/2018   CL 104 05/02/2018   CO2 24 05/02/2018   Still having headaches, she has one right now that started this morning. 4/10 today. Problem seems to be aggravated by stress.  No associated nausea, photophobia, vomiting. Migraines in general are less frequent and not as intense. She is still having left upper extremity numbness, constant, improved after she decreased Topamax from 100 mg to 50 mg and is stable since her last visit. She denies focal weakness or pain.  Anxiety and depression: Last visit Valium 5 mg to take twice daily as needed was continued, medication was started after ER visit. She does not need medication daily, she takes it mainly at night to help her "relax."  She denies side effects. She is also on Effexor XR 37.5 mg, also started last visit. She has not noted major difference in anxiety, no side effects reported. Insomnia slightly worse, she is waking up earlier and not able to go back to sleep. She feels "a little maniac ", she denies risky behavior. History of bipolar disorder. She is also taking Abilify 2 mg daily. She denies depressed mood or suicidal thoughts.  Review of Systems  Constitutional: Positive for fatigue. Negative for activity change, appetite change, fever and unexpected weight change.  HENT: Negative for mouth sores, nosebleeds and trouble swallowing.   Eyes: Negative  for redness and visual disturbance.  Respiratory: Negative for cough, shortness of breath and wheezing.   Cardiovascular: Negative for chest pain, palpitations and leg swelling.  Gastrointestinal: Negative for abdominal pain, nausea and vomiting.       Negative for changes in bowel habits.  Genitourinary: Negative for decreased urine volume, dysuria and hematuria.  Neurological: Positive for numbness and headaches. Negative for seizures, syncope and weakness.  Psychiatric/Behavioral: Positive for sleep disturbance. Negative for confusion, hallucinations and suicidal ideas. The patient is nervous/anxious.      Current Outpatient Medications on File Prior to Visit  Medication Sig Dispense Refill  . diazepam (VALIUM) 5 MG tablet Take 1 tablet (5 mg total) by mouth every 12 (twelve) hours as needed for anxiety. 45 tablet 0  . fluticasone (FLONASE) 50 MCG/ACT nasal spray Place 1 spray into both nostrils 2 (two) times daily. (Patient taking differently: Place 1 spray into both nostrils 2 (two) times daily as needed for allergies. ) 16 g 3  . levothyroxine (SYNTHROID, LEVOTHROID) 100 MCG tablet Take 1 tablet (100 mcg total) by mouth daily before breakfast. 90 tablet 3  . OVER THE COUNTER MEDICATION Take 1 tablet by mouth daily as needed. Sleep aid    . topiramate (TOPAMAX) 50 MG tablet TAKE 1 TABLET BY MOUTH EVERYDAY AT BEDTIME 90 tablet 0   No current facility-administered medications on file prior to visit.      Past Medical History:  Diagnosis Date  . Anemia   . Anxiety  postpartum  . Depression    post partum  . Gestational diabetes    diet controlled  . Hypercalcemia   . Hyperlipidemia   . Hypertension    not on high blood pressure meds anymore  . Hypothyroidism   . PE (pulmonary embolism) 2004  . Postpartum care following vaginal delivery (11/15) 06/18/2014  . Thyroid disease    hypothyroidism  . Vitamin D deficiency     Allergies  Allergen Reactions  . Ramipril Cough      Social History   Socioeconomic History  . Marital status: Married    Spouse name: Not on file  . Number of children: Not on file  . Years of education: Not on file  . Highest education level: Not on file  Occupational History  . Not on file  Social Needs  . Financial resource strain: Not on file  . Food insecurity:    Worry: Not on file    Inability: Not on file  . Transportation needs:    Medical: Not on file    Non-medical: Not on file  Tobacco Use  . Smoking status: Former Smoker    Types: Cigarettes    Last attempt to quit: 02/25/2008    Years since quitting: 10.2  . Smokeless tobacco: Never Used  Substance and Sexual Activity  . Alcohol use: Yes    Comment: social  . Drug use: No  . Sexual activity: Yes    Birth control/protection: None, Condom  Lifestyle  . Physical activity:    Days per week: Not on file    Minutes per session: Not on file  . Stress: Not on file  Relationships  . Social connections:    Talks on phone: Not on file    Gets together: Not on file    Attends religious service: Not on file    Active member of club or organization: Not on file    Attends meetings of clubs or organizations: Not on file    Relationship status: Not on file  Other Topics Concern  . Not on file  Social History Narrative  . Not on file    Vitals:   06/06/18 0828  BP: 126/84  Pulse: 84  Resp: 12  Temp: 98.8 F (37.1 C)  SpO2: 99%   Body mass index is 34.12 kg/m.    Physical Exam  Nursing note and vitals reviewed. Constitutional: She is oriented to person, place, and time. She appears well-developed. No distress.  HENT:  Head: Normocephalic and atraumatic.  Mouth/Throat: Oropharynx is clear and moist and mucous membranes are normal.  Eyes: Pupils are equal, round, and reactive to light. Conjunctivae are normal.  Cardiovascular: Normal rate and regular rhythm.  No murmur heard. Pulses:      Dorsalis pedis pulses are 2+ on the right side, and 2+ on  the left side.  Respiratory: Effort normal and breath sounds normal. No respiratory distress.  GI: Soft. She exhibits no mass. There is no hepatomegaly. There is no tenderness.  Musculoskeletal: She exhibits no edema.  Lymphadenopathy:    She has no cervical adenopathy.  Neurological: She is alert and oriented to person, place, and time. She has normal strength. No cranial nerve deficit. Gait normal.  Skin: Skin is warm. No rash noted. No erythema.  Psychiatric: Her mood appears anxious. She expresses no suicidal ideation. She expresses no suicidal plans.  Well groomed, good eye contact.    ASSESSMENT AND PLAN:    [name] is here  today for follow up.  Diagnoses and all orders for this visit:  HTN (hypertension) Reporting mildly elevated BPs at home. Resume amlodipine 5 mg. No changes in HCTZ 25 mg. Continue monitoring BP and following low-salt diet. She needs an eye exam.   Anxiety disorder No major changes after starting Effexor XR 75 mg, for now no changes in current dose, we will wait 3-4 more weeks before we decide adjusting dose. No changes in Valium 5 mg daily as needed. Follow-up in 3 to 4 months, before if needed.  Mood disorder (HCC) Insomnia mildly worse since she started Effexor. Recommend increasing Abilify from 2 mg to 5 mg. No changes in Effexor. Instructed about warning signs. Follow-up in 3 to 4 months, before if needed.  Headache, unspecified headache type Currently she is having a headache but otherwise problem is stable. She did not tolerate higher doses of Topamax, current dose seems to be helping. Pending appointment with neurologist. No changes in current management. Instructed about warning signs.       G. [place], MD  [facility]. [place] office.

## 2018-06-06 NOTE — Assessment & Plan Note (Signed)
Reporting mildly elevated BPs at home. Resume amlodipine 5 mg. No changes in HCTZ 25 mg. Continue monitoring BP and following low-salt diet. She needs an eye exam.

## 2018-06-09 ENCOUNTER — Encounter: Payer: Self-pay | Admitting: Family Medicine

## 2018-06-24 ENCOUNTER — Other Ambulatory Visit: Payer: Self-pay | Admitting: Family Medicine

## 2018-06-24 DIAGNOSIS — F39 Unspecified mood [affective] disorder: Secondary | ICD-10-CM

## 2018-07-13 ENCOUNTER — Encounter: Payer: Self-pay | Admitting: Neurology

## 2018-07-13 ENCOUNTER — Ambulatory Visit: Payer: BC Managed Care – PPO | Admitting: Neurology

## 2018-07-13 VITALS — BP 118/88 | HR 101 | Ht 66.0 in | Wt 206.0 lb

## 2018-07-13 DIAGNOSIS — R200 Anesthesia of skin: Secondary | ICD-10-CM

## 2018-07-13 DIAGNOSIS — G43709 Chronic migraine without aura, not intractable, without status migrainosus: Secondary | ICD-10-CM | POA: Diagnosis not present

## 2018-07-13 MED ORDER — TOPIRAMATE ER 50 MG PO CAP24: 50.0000 mg | ORAL_CAPSULE | Freq: Every day | ORAL | 0 refills | Status: DC

## 2018-07-13 MED ORDER — TOPIRAMATE ER 50 MG PO CAP24: 50.0000 mg | ORAL_CAPSULE | Freq: Every day | ORAL | 11 refills | Status: DC

## 2018-07-13 NOTE — Patient Instructions (Addendum)
Start Trokendi 50mg  daily  Limit tylenol or ibuprofen for severe headache to twice per week  If your numbness gets worse, please call my office to schedule nerve testing  Return to clinic in 4 months

## 2018-07-13 NOTE — Progress Notes (Signed)
[facility] Clinic Note - Initial Visit   Date: 07/13/18  [name] MRN: [id number] DOB: 07/26/1981   Dear Dr. [place]:  Thank you for your kind referral of Jennifer Mcmahon for consultation of migraines and left arm numbness. Although her history is well known to you, please allow [place] to reiterate it for the purpose of our medical record. The patient was accompanied to the clinic by self.    History of Present Illness: Jennifer Mcmahon is a 37 y.o. right-handed Caucasian female with hypothyroidism, depression, hypertension, and panic attacks/anxiety presenting for evaluation of migraines and left arm numbness.    She started having headaches in 2017, left parietal headaches, described as throbbing pain. Headaches initially would last all day and occur daily.  No preceding paresthesias, weakness, or vision changes. She began treating it with daily OTC ibuprofen and tylenol.  She saw her PCP who started amitriptyline late in 2018, which provided some relief.  She discontinued the medication due to side effects of extreme restlessness.  In August 2019, she started topiramate 50mg  daily, which has helped reduce her headaches to 1-2 times per week, lasting about an hour.  She treats this with tylenol which has helped and reduces intensity from 6/10 to 3/10.  Around the end of September, she began having tingling and numbness of the hands, which started in the left hand.  She also noticed numbness and tingling of the left forearm after starting the medications.  She had two ER visits in September for headaches and left arm numbness in the setting of high anxiety.   MRI brain wo contrast x 2 was normal.    Yesterday, she was trying to moves over chairs at a concert her daughter was in and fell down.  She had urinary incontinence with this fall.  There was no loss of consciousness, confusion, or abnormal movements.  She has been getting a lot of "random" symptoms  over the past 2 years and as she learns more about her family history, she feels that she is getting new symptoms. She endorses having high degree of anxiety.   Out-side paper records, electronic medical record, and images have been reviewed where available and summarized as:  MRI brain wo contrast 04/13/2018:  No acute intracranial abnormality. Stable normal MRI of the brain for age. MRI brain wo contrast 04/03/2018:  Solitary small hyperintensity right frontal white matter otherwise within normal limits. Within normal limits for age.  Past Medical History:  Diagnosis Date  . Anemia   . Anxiety    postpartum  . Depression    post partum  . Gestational diabetes    diet controlled  . Hypercalcemia   . Hyperlipidemia   . Hypertension    not on high blood pressure meds anymore  . Hypothyroidism   . PE (pulmonary embolism) 2004  . Postpartum care following vaginal delivery (11/15) 06/18/2014  . Thyroid disease    hypothyroidism  . Vitamin D deficiency     Past Surgical History:  Procedure Laterality Date  . CHOLECYSTECTOMY N/A 06/12/2016   Procedure: LAPAROSCOPIC CHOLECYSTECTOMY;  Surgeon: Axel Filler, MD;  Location: [facility] OR;  Service: General;  Laterality: N/A;  . EYE SURGERY     lasik  . WISDOM TOOTH EXTRACTION       Medications:  Outpatient Encounter Medications as of 07/13/2018  Medication Sig  . amLODipine (NORVASC) 5 MG tablet Take 1 tablet (5 mg total) by mouth daily.  . ARIPiprazole (ABILIFY) 5 MG  tablet Take 1 tablet (5 mg total) by mouth daily.  . diazepam (VALIUM) 5 MG tablet Take 1 tablet (5 mg total) by mouth every 12 (twelve) hours as needed for anxiety.  . fluticasone (FLONASE) 50 MCG/ACT nasal spray Place 1 spray into both nostrils 2 (two) times daily. (Patient taking differently: Place 1 spray into both nostrils 2 (two) times daily as needed for allergies. )  . hydrochlorothiazide (HYDRODIURIL) 25 MG tablet Take 1 tablet (25 mg total) by mouth daily.  Marland Kitchen  levothyroxine (SYNTHROID, LEVOTHROID) 100 MCG tablet Take 1 tablet (100 mcg total) by mouth daily before breakfast.  . OVER THE COUNTER MEDICATION Take 1 tablet by mouth daily as needed. Sleep aid  . Topiramate ER (TROKENDI XR) 50 MG CP24 Take 50 mg by mouth daily.  . Topiramate ER (TROKENDI XR) 50 MG CP24 Take 50 mg by mouth daily.  Marland Kitchen venlafaxine XR (EFFEXOR XR) 37.5 MG 24 hr capsule Take 1 capsule (37.5 mg total) by mouth daily with breakfast.  . [DISCONTINUED] topiramate (TOPAMAX) 50 MG tablet TAKE 1 TABLET BY MOUTH EVERYDAY AT BEDTIME   No facility-administered encounter medications on file as of 07/13/2018.      Allergies:  Allergies  Allergen Reactions  . Ramipril Cough    Family History: Family History  Problem Relation Age of Onset  . Cancer Mother        uterine and ovarian  . Hypertension Mother   . Varicose Veins Mother   . Thyroid disease Mother   . Hyperlipidemia Mother   . Mental illness Mother   . Tuberculosis Sister   . Heart disease Maternal Grandmother   . Varicose Veins Maternal Grandfather   . Mental illness Maternal Grandfather   . Bipolar disorder Paternal Grandfather   . Cancer Paternal Grandmother     Social History: Social History   Tobacco Use  . Smoking status: Former Smoker    Types: Cigarettes    Last attempt to quit: 02/25/2008    Years since quitting: 10.3  . Smokeless tobacco: Never Used  Substance Use Topics  . Alcohol use: Yes    Comment: social  . Drug use: No   Social History   Social History Narrative   Patient is right-handed. She lives with her husband and two children in a one level home. She drinks 3 cups of coffee and 4 cans of soda a day. She walks her dog daily.    Review of Systems:  CONSTITUTIONAL: No fevers, chills, night sweats, or weight loss.   EYES: No visual changes or eye pain ENT: No hearing changes.  No history of nose bleeds.   RESPIRATORY: No cough, wheezing and shortness of breath.   CARDIOVASCULAR:  Negative for chest pain, and palpitations.   GI: Negative for abdominal discomfort, blood in stools or black stools.  No recent change in bowel habits.   GU:  No history of incontinence.   MUSCLOSKELETAL: No history of joint pain or swelling.  No myalgias.   SKIN: Negative for lesions, rash, and itching.   HEMATOLOGY/ONCOLOGY: Negative for prolonged bleeding, bruising easily, and swollen nodes.  No history of cancer.   ENDOCRINE: Negative for cold or heat intolerance, polydipsia or goiter.   PSYCH:  +depression +anxiety symptoms.   NEURO: As Above.   Vital Signs:  BP 118/88   Pulse (!) 101   Ht 5\' 6"  (1.676 m)   Wt 206 lb (93.4 kg)   SpO2 98%   BMI 33.25 kg/m   General  Medical Exam:   General:  Well appearing, comfortable.   Eyes/ENT: see cranial nerve examination.   Neck: No masses appreciated.  Full range of motion without tenderness.  No carotid bruits. Respiratory:  Clear to auscultation, good air entry bilaterally.   Cardiac:  Regular rate and rhythm, no murmur.   Extremities:  No deformities, edema, or skin discoloration.  Skin:  No rashes or lesions.  Neurological Exam: MENTAL STATUS including orientation to time, place, person, recent and remote memory, attention span and concentration, language, and fund of knowledge is normal.  Speech is not dysarthric.  CRANIAL NERVES: II:  No visual field defects.  Unremarkable fundi.   III-IV-VI: Pupils equal round and reactive to light.  Normal conjugate, extra-ocular eye movements in all directions of gaze.  No nystagmus.  No ptosis.   V:  Normal facial sensation.    VII:  Normal facial symmetry and movements.   VIII:  Normal hearing and vestibular function.   IX-X:  Normal palatal movement.   XI:  Normal shoulder shrug and head rotation.   XII:  Normal tongue strength and range of motion, no deviation or fasciculation.  MOTOR:  No atrophy, fasciculations or abnormal movements.  No pronator drift.  Tone is normal.    Right  Upper Extremity:    Left Upper Extremity:    Deltoid  5/5   Deltoid  5/5   Biceps  5/5   Biceps  5/5   Triceps  5/5   Triceps  5/5   Wrist extensors  5/5   Wrist extensors  5/5   Wrist flexors  5/5   Wrist flexors  5/5   Finger extensors  5/5   Finger extensors  5/5   Finger flexors  5/5   Finger flexors  5/5   Dorsal interossei  5/5   Dorsal interossei  5/5   Abductor pollicis  5/5   Abductor pollicis  5/5   Tone (Ashworth scale)  0  Tone (Ashworth scale)  0   Right Lower Extremity:    Left Lower Extremity:    Hip flexors  5/5   Hip flexors  5/5   Hip extensors  5/5   Hip extensors  5/5   Knee flexors  5/5   Knee flexors  5/5   Knee extensors  5/5   Knee extensors  5/5   Dorsiflexors  5/5   Dorsiflexors  5/5   Plantarflexors  5/5   Plantarflexors  5/5   Toe extensors  5/5   Toe extensors  5/5   Toe flexors  5/5   Toe flexors  5/5   Tone (Ashworth scale)  0  Tone (Ashworth scale)  0   MSRs:  Right                                                                 Left brachioradialis 2+  brachioradialis 2+  biceps 2+  biceps 2+  triceps 2+  triceps 2+  patellar 2+  patellar 2+  ankle jerk 2+  ankle jerk 2+  Hoffman no  Hoffman no  plantar response down  plantar response down   SENSORY:  Reduced pin prick over the left dorsal surface of the hand and fingers.  Sensation is normal in the right hand.  Vibration and temperature is intact throughout.  Romberg's sign absent. Negative Tinel's sign at the wrist.   COORDINATION/GAIT: Normal finger-to- nose-finger.  Intact rapid alternating movements bilaterally. Gait narrow based and stable. Tandem and stressed gait intact.    IMPRESSION: 1.  Chronic migraine without aura.  Frequency of headaches are improved on topiramate 50mg  to 1-2 headaches/week.  Episodic headaches brief, lasting < 1 hr are alleviated with OTC medications.  Unfortunately, she is having side effects of paresthesias with topiramate, so I will transition her to Trokendi  50mg  daily (extended release topiramate) which reduces these paresthesias as well as cognitive side effects.  Samples and coupon card was given to patient. She was educated about medication side effects including fetal defects and has no plans for pregnancy. MRI brain was personally viewed and does not show structural pathology causing headaches. Lifestyle modification, including reducing caffeine consumption discussed.   2.  Left hand paresthesias, nonspecific and does not confirm to a nerve distribution.  I suspect this is most likely topiramate-associated paresthesias.  Hopefully, transitioning her to extended release formula will help. If symptoms persist, the next step is NCS/EMG of the left arm.  3.  Fall with urinary incontinence.  Her fall seems mechanical while trying to climb over chairs.  No worrisome features to suggest seizure disorder, therefore, unlikely that her incontinence is due to primary neurological pathology.  If this persists, she should follow-up with her OBGYN for possible stress incontinence.    Return to clinic in 4 months   Thank you for allowing me to participate in patient's care.  If I can answer any additional questions, I would be pleased to do so.    Sincerely,    Donika K. Allena KatzPatel, DO

## 2018-07-22 ENCOUNTER — Ambulatory Visit (INDEPENDENT_AMBULATORY_CARE_PROVIDER_SITE_OTHER): Payer: BC Managed Care – PPO | Admitting: Family Medicine

## 2018-07-22 ENCOUNTER — Encounter: Payer: Self-pay | Admitting: Family Medicine

## 2018-07-22 VITALS — BP 120/76 | HR 89 | Temp 98.9°F | Resp 12 | Ht 66.0 in | Wt 214.4 lb

## 2018-07-22 DIAGNOSIS — Z130 Encounter for screening for diseases of the blood and blood-forming organs and certain disorders involving the immune mechanism: Secondary | ICD-10-CM

## 2018-07-22 DIAGNOSIS — Z0000 Encounter for general adult medical examination without abnormal findings: Secondary | ICD-10-CM | POA: Diagnosis not present

## 2018-07-22 DIAGNOSIS — F39 Unspecified mood [affective] disorder: Secondary | ICD-10-CM

## 2018-07-22 DIAGNOSIS — Z13220 Encounter for screening for lipoid disorders: Secondary | ICD-10-CM

## 2018-07-22 DIAGNOSIS — Z1329 Encounter for screening for other suspected endocrine disorder: Secondary | ICD-10-CM

## 2018-07-22 DIAGNOSIS — Z13228 Encounter for screening for other metabolic disorders: Secondary | ICD-10-CM

## 2018-07-22 DIAGNOSIS — F411 Generalized anxiety disorder: Secondary | ICD-10-CM

## 2018-07-22 MED ORDER — ARIPIPRAZOLE 5 MG PO TABS: ORAL_TABLET | ORAL | 3 refills | Status: DC

## 2018-07-22 NOTE — Patient Instructions (Addendum)
A few things to remember from today's visit:   Routine general medical examination at a health care facility  Screening for lipoid disorders - Plan: Lipid panel  Screening for endocrine, metabolic and immunity disorder - Plan: Basic metabolic panel, Hemoglobin A1c  Mood disorder (HCC) - Plan: ARIPiprazole (ABILIFY) 5 MG tablet  Today you have you routine preventive visit.  At least 150 minutes of moderate exercise per week, daily brisk walking for 15-30 min is a good exercise option. Healthy diet low in saturated (animal) fats and sweets and consisting of fresh fruits and vegetables, lean meats such as fish and white chicken and whole grains.  These are some of recommendations for screening depending of age and risk factors:   - Vaccines:  Tdap vaccine every 10 years.  Shingles vaccine recommended at age 39, could be given after 39 years of age but not sure about insurance coverage.   Pneumonia vaccines:  Prevnar 13 at 65 and Pneumovax at 66. Sometimes Pneumovax is giving earlier if history of smoking, lung disease,diabetes,kidney disease among some.    Screening for diabetes annually.  Cervical cancer prevention:  Pap smear starts at 39 years of age and continues periodically until 39 years old in low risk women. Pap smear every 3 years between 6021 and 39 years old. Pap smear every 3-5 years between women 30 and older if pap smear negative and HPV screening negative.   -Breast cancer: Mammogram: There is disagreement between experts about when to start screening in low risk asymptomatic female but recent recommendations are to start screening at 6540 and not later than 39 years old , every 1-2 years and after 39 yo q 2 years. Screening is recommended until 39 years old but some women can continue screening depending of healthy issues.   Colon cancer screening: starts at 39 years old until 39 years old.  Cholesterol disorder screening at age 39 and every 3 years.  Also  recommended:  1. Dental visit- Brush and floss your teeth twice daily; visit your dentist twice a year. 2. Eye doctor- Get an eye exam at least every 2 years. 3. Helmet use- Always wear a helmet when riding a bicycle, motorcycle, rollerblading or skateboarding. 4. Safe sex- If you may be exposed to sexually transmitted infections, use a condom. 5. Seat belts- Seat belts can save your live; always wear one. 6. Smoke/Carbon Monoxide detectors- These detectors need to be installed on the appropriate level of your home. Replace batteries at least once a year. 7. Skin cancer- When out in the sun please cover up and use sunscreen 15 SPF or higher. 8. Violence- If anyone is threatening or hurting you, please tell your healthcare provider.  9. Drink alcohol in moderation- Limit alcohol intake to one drink or less per day. Never drink and drive.  Please be sure medication list is accurate. If a new problem present, please set up appointment sooner than planned today.

## 2018-07-22 NOTE — Assessment & Plan Note (Signed)
It seems to be exacerbated by Abilify. We will start weaning off Abilify. No changes in Valium or Effexor. She will let me know how she is doing in between now and her next visit. Follow-up in 5 months, before if needed.

## 2018-07-22 NOTE — Progress Notes (Signed)
HPI:   [name] is a 37 y.o. female, who is here today for her routine physical. She was last seen on 06/06/2017.  Since her last visit she has followed with neurologist, started on Trokendi XR 50 mg daily. In general she has not noted major difference in headaches frequency. She is tolerating medication well.  Mood disorder: Currently she is on Effexor 37.5 mg daily and Abilify 5 mg daily.  Abilify was increased from 2 mg last visit, since then she has been having some panic attacks. She denies manic-like symptoms. Depression is well controlled, denies suicidal thoughts. She is also taking Valium 5 mg daily as needed for acute anxiety.  Last CPE: 07/19/2017  Regular exercise 3 or more time per week: Not consistent. Following a healthy diet: Yes. She lives with her husband and 2 children.   Chronic medical problems: Hypertension, depression, anxiety, mood disorder, gestational diabetes, and migraines.  Hypertension: Currently she is on amlodipine 5 mg daily and HCTZ 25 mg daily.  Pap smear 08/2018.  She follows with gynecologist, next appointment 08/22/2018. History of HPV.   Immunization History  Administered Date(s) Administered  . Influenza,inj,Quad PF,6+ Mos 05/08/2013, 04/15/2015, 04/30/2016, 07/19/2017  . Tdap 11/05/2002, 05/08/2013     Review of Systems  Constitutional: Negative for appetite change, fatigue and fever.  HENT: Negative for hearing loss, mouth sores, trouble swallowing and voice change.   Eyes: Negative for photophobia and visual disturbance.  Respiratory: Negative for cough, shortness of breath and wheezing.   Cardiovascular: Negative for chest pain, palpitations and leg swelling.  Gastrointestinal: Negative for abdominal pain, nausea and vomiting.       No changes in bowel habits.  Endocrine: Negative for cold intolerance, heat intolerance, polydipsia, polyphagia and polyuria.  Genitourinary: Negative for decreased urine  volume, dysuria, hematuria, vaginal bleeding and vaginal discharge.  Musculoskeletal: Negative for arthralgias, back pain and neck pain.  Skin: Negative for color change and rash.  Allergic/Immunologic: Positive for environmental allergies.  Neurological: Positive for headaches. Negative for seizures, syncope and weakness.  Hematological: Negative for adenopathy. Does not bruise/bleed easily.  Psychiatric/Behavioral: Negative for confusion, hallucinations and sleep disturbance. The patient is nervous/anxious.   All other systems reviewed and are negative.     Current Outpatient Medications on File Prior to Visit  Medication Sig Dispense Refill  . amLODipine (NORVASC) 5 MG tablet Take 1 tablet (5 mg total) by mouth daily. 90 tablet 2  . diazepam (VALIUM) 5 MG tablet Take 1 tablet (5 mg total) by mouth every 12 (twelve) hours as needed for anxiety. 45 tablet 0  . fluticasone (FLONASE) 50 MCG/ACT nasal spray Place 1 spray into both nostrils 2 (two) times daily. (Patient taking differently: Place 1 spray into both nostrils 2 (two) times daily as needed for allergies. ) 16 g 3  . hydrochlorothiazide (HYDRODIURIL) 25 MG tablet Take 1 tablet (25 mg total) by mouth daily. 90 tablet 1  . levothyroxine (SYNTHROID, LEVOTHROID) 100 MCG tablet Take 1 tablet (100 mcg total) by mouth daily before breakfast. 90 tablet 3  . OVER THE COUNTER MEDICATION Take 1 tablet by mouth daily as needed. Sleep aid    . Topiramate ER (TROKENDI XR) 50 MG CP24 Take 50 mg by mouth daily. 30 capsule 11  . venlafaxine XR (EFFEXOR XR) 37.5 MG 24 hr capsule Take 1 capsule (37.5 mg total) by mouth daily with breakfast. 90 capsule 1   No current facility-administered medications on file prior to visit.  Past Medical History:  Diagnosis Date  . Anemia   . Anxiety    postpartum  . Depression    post partum  . Gestational diabetes    diet controlled  . Hypercalcemia   . Hyperlipidemia   . Hypertension    not on high  blood pressure meds anymore  . Hypothyroidism   . PE (pulmonary embolism) 2004  . Postpartum care following vaginal delivery (11/15) 06/18/2014  . Thyroid disease    hypothyroidism  . Vitamin D deficiency     Past Surgical History:  Procedure Laterality Date  . CHOLECYSTECTOMY N/A 06/12/2016   Procedure: LAPAROSCOPIC CHOLECYSTECTOMY;  Surgeon: Axel Filler, MD;  Location: [facility] OR;  Service: General;  Laterality: N/A;  . EYE SURGERY     lasik  . WISDOM TOOTH EXTRACTION      Allergies  Allergen Reactions  . Ramipril Cough    Family History  Problem Relation Age of Onset  . Cancer Mother        uterine and ovarian  . Hypertension Mother   . Varicose Veins Mother   . Thyroid disease Mother   . Hyperlipidemia Mother   . Mental illness Mother   . Tuberculosis Sister   . Heart disease Maternal Grandmother   . Varicose Veins Maternal Grandfather   . Mental illness Maternal Grandfather   . Bipolar disorder Paternal Grandfather   . Cancer Paternal Grandmother     Social History   Socioeconomic History  . Marital status: Married    Spouse name: Barbara Cower  . Number of children: 2  . Years of education: Not on file  . Highest education level: Master's degree (e.g., MA, MS, MEng, MEd, MSW, MBA)  Occupational History  . Occupation: Careers adviser: [employer]  Social Needs  . Financial resource strain: Not on file  . Food insecurity:    Worry: Not on file    Inability: Not on file  . Transportation needs:    Medical: Not on file    Non-medical: Not on file  Tobacco Use  . Smoking status: Former Smoker    Types: Cigarettes    Last attempt to quit: 02/25/2008    Years since quitting: 10.4  . Smokeless tobacco: Never Used  Substance and Sexual Activity  . Alcohol use: Yes    Comment: social  . Drug use: No  . Sexual activity: Yes    Birth control/protection: None, Condom  Lifestyle  . Physical activity:    Days per week: Not on file    Minutes per session:  Not on file  . Stress: Not on file  Relationships  . Social connections:    Talks on phone: Not on file    Gets together: Not on file    Attends religious service: Not on file    Active member of club or organization: Not on file    Attends meetings of clubs or organizations: Not on file    Relationship status: Not on file  Other Topics Concern  . Not on file  Social History Narrative   Patient is right-handed. She lives with her husband and two children in a one level home. She drinks 3 cups of coffee and 4 cans of soda a day. She walks her dog daily.     Vitals:   07/22/18 0849  BP: 120/76  Pulse: 89  Resp: 12  Temp: 98.9 F (37.2 C)  SpO2: 98%   Body mass index is 34.6 kg/m.   Wt  Readings from Last 3 Encounters:  07/22/18 214 lb 6 oz (97.2 kg)  07/13/18 206 lb (93.4 kg)  06/06/18 211 lb 6 oz (95.9 kg)    Physical Exam  Nursing note and vitals reviewed. Constitutional: She is oriented to person, place, and time. She appears well-developed. No distress.  HENT:  Head: Normocephalic and atraumatic.  Right Ear: Hearing, tympanic membrane, external ear and ear canal normal.  Left Ear: Hearing, tympanic membrane, external ear and ear canal normal.  Mouth/Throat: Uvula is midline, oropharynx is clear and moist and mucous membranes are normal.  Eyes: Pupils are equal, round, and reactive to light. Conjunctivae and EOM are normal.  Neck: No tracheal deviation present. No thyromegaly present.  Cardiovascular: Normal rate and regular rhythm.  No murmur heard. Pulses:      Dorsalis pedis pulses are 2+ on the right side and 2+ on the left side.  Respiratory: Effort normal and breath sounds normal. No respiratory distress.  GI: Soft. She exhibits no mass. There is no hepatomegaly. There is no abdominal tenderness.  Genitourinary:    Genitourinary Comments: Deferred to gyn.   Musculoskeletal:        General: No edema.     Comments: No major deformity or signs of synovitis  appreciated.  Lymphadenopathy:    She has no cervical adenopathy.       Right: No supraclavicular adenopathy present.       Left: No supraclavicular adenopathy present.  Neurological: She is alert and oriented to person, place, and time. She has normal strength. No cranial nerve deficit. Coordination and gait normal.  Reflex Scores:      Bicep reflexes are 2+ on the right side and 2+ on the left side.      Patellar reflexes are 2+ on the right side and 2+ on the left side. Skin: Skin is warm. No rash noted. No erythema.  Skin tattoos on back.  Psychiatric: Her speech is normal. Her mood appears anxious. Cognition and memory are normal.  Well groomed, good eye contact.      ASSESSMENT AND PLAN:  Ms. Paulita Cradleeresa Lea Tal was here today annual physical examination.     Orders Placed This Encounter  Procedures  . Basic metabolic panel  . Hemoglobin A1c  . Lipid panel    Routine general medical examination at a health care facility We discussed the importance of regular physical activity and healthy diet for prevention of chronic illness and/or complications. Preventive guidelines reviewed. Vaccination up-to-date. She will continue following with gynecologist for her female preventive care. Next CPE in a year.  Screening for lipoid disorders -     Lipid panel; Future  Screening for endocrine, metabolic and immunity disorder -     Basic metabolic panel; Future -     Hemoglobin A1c; Future  She is not fasting today, she will be back after the new year for fasting labs.  Anxiety disorder It seems to be exacerbated by Abilify. We will start weaning off Abilify. No changes in Valium or Effexor. She will let me know how she is doing in between now and her next visit. Follow-up in 5 months, before if needed.  Mood disorder (HCC) Abilify has not helped much now aggravating anxiety. She is going to wean off Abilify. Continue Effexor XR 37.5 mg daily. Instructed about warning  signs.     Return in 5 months (on 12/21/2018) for f/u.       Betty G. Swazil[place]Jordan, MD  [facility]. [place]  office.

## 2018-07-22 NOTE — Assessment & Plan Note (Signed)
Abilify has not helped much now aggravating anxiety. She is going to wean off Abilify. Continue Effexor XR 37.5 mg daily. Instructed about warning signs.

## 2018-08-08 ENCOUNTER — Other Ambulatory Visit (INDEPENDENT_AMBULATORY_CARE_PROVIDER_SITE_OTHER): Payer: BC Managed Care – PPO

## 2018-08-08 DIAGNOSIS — Z13228 Encounter for screening for other metabolic disorders: Secondary | ICD-10-CM

## 2018-08-08 DIAGNOSIS — Z1329 Encounter for screening for other suspected endocrine disorder: Secondary | ICD-10-CM | POA: Diagnosis not present

## 2018-08-08 DIAGNOSIS — Z13220 Encounter for screening for lipoid disorders: Secondary | ICD-10-CM | POA: Diagnosis not present

## 2018-08-08 DIAGNOSIS — Z130 Encounter for screening for diseases of the blood and blood-forming organs and certain disorders involving the immune mechanism: Secondary | ICD-10-CM | POA: Diagnosis not present

## 2018-08-08 LAB — LIPID PANEL
CHOL/HDL RATIO: 4
CHOLESTEROL: 223 mg/dL — AB (ref 0–200)
HDL: 53.1 mg/dL (ref >39.00)
LDL Cholesterol: 136 mg/dL — ABNORMAL HIGH (ref 0–99)
NonHDL: 170.01
TRIGLYCERIDES: 172 mg/dL — AB (ref 0.0–149.0)
VLDL: 34.4 mg/dL (ref 0.0–40.0)

## 2018-08-08 LAB — BASIC METABOLIC PANEL WITH GFR
BUN: 17 mg/dL (ref 6–23)
CO2: 26 meq/L (ref 19–32)
Calcium: 9.5 mg/dL (ref 8.4–10.5)
Chloride: 104 meq/L (ref 96–112)
Creatinine, Ser: 1.06 mg/dL (ref 0.40–1.20)
GFR: 61.18 mL/min (ref >60.00)
Glucose, Bld: 103 mg/dL — ABNORMAL HIGH (ref 70–99)
Potassium: 3.8 meq/L (ref 3.5–5.1)
Sodium: 138 meq/L (ref 135–145)

## 2018-08-08 LAB — HEMOGLOBIN A1C: Hgb A1c MFr Bld: 5.6 % (ref 4.6–6.5)

## 2018-08-13 ENCOUNTER — Other Ambulatory Visit: Payer: Self-pay | Admitting: Family Medicine

## 2018-08-13 DIAGNOSIS — R51 Headache: Principal | ICD-10-CM

## 2018-08-13 DIAGNOSIS — R519 Headache, unspecified: Secondary | ICD-10-CM

## 2018-09-14 ENCOUNTER — Other Ambulatory Visit: Payer: Self-pay | Admitting: Radiology

## 2018-09-16 ENCOUNTER — Other Ambulatory Visit: Payer: Self-pay | Admitting: Family Medicine

## 2018-09-16 DIAGNOSIS — E039 Hypothyroidism, unspecified: Secondary | ICD-10-CM

## 2018-10-14 ENCOUNTER — Other Ambulatory Visit: Payer: Self-pay | Admitting: Family Medicine

## 2018-10-14 DIAGNOSIS — F419 Anxiety disorder, unspecified: Secondary | ICD-10-CM

## 2018-10-17 NOTE — Telephone Encounter (Signed)
Last Rx given on 9/30 for #45 with no ref

## 2018-11-14 ENCOUNTER — Ambulatory Visit: Payer: BC Managed Care – PPO | Admitting: Neurology

## 2018-12-17 ENCOUNTER — Other Ambulatory Visit: Payer: Self-pay | Admitting: Family Medicine

## 2018-12-17 DIAGNOSIS — F411 Generalized anxiety disorder: Secondary | ICD-10-CM

## 2018-12-19 NOTE — Telephone Encounter (Signed)
Patient has ov on 5/20/200.

## 2018-12-21 ENCOUNTER — Other Ambulatory Visit: Payer: Self-pay

## 2018-12-21 ENCOUNTER — Encounter: Payer: Self-pay | Admitting: Family Medicine

## 2018-12-21 ENCOUNTER — Ambulatory Visit (INDEPENDENT_AMBULATORY_CARE_PROVIDER_SITE_OTHER): Payer: BC Managed Care – PPO | Admitting: Family Medicine

## 2018-12-21 DIAGNOSIS — I10 Essential (primary) hypertension: Secondary | ICD-10-CM | POA: Diagnosis not present

## 2018-12-21 DIAGNOSIS — E039 Hypothyroidism, unspecified: Secondary | ICD-10-CM | POA: Diagnosis not present

## 2018-12-21 DIAGNOSIS — F411 Generalized anxiety disorder: Secondary | ICD-10-CM | POA: Diagnosis not present

## 2018-12-21 DIAGNOSIS — Z6833 Body mass index (BMI) 33.0-33.9, adult: Secondary | ICD-10-CM

## 2018-12-21 DIAGNOSIS — R519 Headache, unspecified: Secondary | ICD-10-CM

## 2018-12-21 DIAGNOSIS — F3170 Bipolar disorder, currently in remission, most recent episode unspecified: Secondary | ICD-10-CM

## 2018-12-21 DIAGNOSIS — E6609 Other obesity due to excess calories: Secondary | ICD-10-CM

## 2018-12-21 DIAGNOSIS — R51 Headache: Secondary | ICD-10-CM

## 2018-12-21 NOTE — Assessment & Plan Note (Signed)
Problem is better controlled. Continue following with psychiatrist monthly. Planning on checking lithium level in 2 weeks, recommend adding TSH. We discussed some side effects of lithium.

## 2018-12-21 NOTE — Assessment & Plan Note (Signed)
BP has been adequately controlled. Recommend monitoring BP more often. Continue HCTZ 25 mg and amlodipine 5 mg daily. Continue low-salt and K+ rich diet. Eye exam annually.

## 2018-12-21 NOTE — Progress Notes (Signed)
Virtual Visit via Video Note   I connected with [name] on 12/21/18 at  8:15 AM EDT by a video enabled telemedicine application and verified that I am speaking with the correct person using two identifiers.  Location patient: home Location provider:home office Persons participating in the virtual visit: patient, provider  I discussed the limitations of evaluation and management by telemedicine and the availability of in person appointments. The patient expressed understanding and agreed to proceed.   HPI:  [name] is a 38 yo female following on some chronic medical problems. She was last seen on 07/22/2018 for her CPE.  Since her last visit she has already established with a neurologist, Topamax was changed to topiramate ER 50 mg daily. She feels like migraines are better controlled, having a few episodes per month.  But she is still having daily mild headaches. She has tolerated medication well. Still having upper extremity numbness but this has been a stable.  -Medication has not helped with weight loss. She has noted her clothes to be tighter, she is not exercising regularly.  Hypothyroidism: Currently she is on levothyroxine 100 mcg daily. Negative for cold/heat intolerance, changes in bowel habits, tremor, palpitations, or abnormal weight loss.  Lab Results  Component Value Date   TSH 3.63 07/19/2017   Bipolar disorder: Since her last visit she has also seen a psychiatrist, Abilify was discontinued and lithium 900 mg daily was added. Still taking Effexor ER 37.5 mg daily and Valium 5 mg daily as needed. She has not taking Valium in a few months. She is following with psychotherapist weekly and seen her psychiatrist every month. Next appointment with psychiatrist is in 2 weeks.  According to patient, symptoms have improved, still having some minor manic episodes back "manageable." Negative for suicidal thoughts.  Hypertension, currently she is on HCTZ 25 mg and  amlodipine 5 mg daily. She is not checking BP often, has not checked in a while. She is tolerating medication well. Denies visual changes, chest pain, dyspnea, abdominal pain, nausea, vomiting, decreased urine output, gross hematuria, edema, or focal neurologic deficit.  Lab Results  Component Value Date   CREATININE 1.06 08/08/2018   BUN 17 08/08/2018   NA 138 08/08/2018   K 3.8 08/08/2018   CL 104 08/08/2018   CO2 26 08/08/2018     ROS: See pertinent positives and negatives per HPI.  Past Medical History:  Diagnosis Date  . Anemia   . Anxiety    postpartum  . Depression    post partum  . Gestational diabetes    diet controlled  . Hypercalcemia   . Hyperlipidemia   . Hypertension    not on high blood pressure meds anymore  . Hypothyroidism   . PE (pulmonary embolism) 2004  . Postpartum care following vaginal delivery (11/15) 06/18/2014  . Thyroid disease    hypothyroidism  . Vitamin D deficiency     Past Surgical History:  Procedure Laterality Date  . CHOLECYSTECTOMY N/A 06/12/2016   Procedure: LAPAROSCOPIC CHOLECYSTECTOMY;  Surgeon: Axel FillerArmando Ramirez, MD;  Location: [facility] OR;  Service: General;  Laterality: N/A;  . EYE SURGERY     lasik  . WISDOM TOOTH EXTRACTION      Family History  Problem Relation Age of Onset  . Cancer Mother        uterine and ovarian  . Hypertension Mother   . Varicose Veins Mother   . Thyroid disease Mother   . Hyperlipidemia Mother   . Mental illness Mother   .  Tuberculosis Sister   . Heart disease Maternal Grandmother   . Varicose Veins Maternal Grandfather   . Mental illness Maternal Grandfather   . Bipolar disorder Paternal Grandfather   . Cancer Paternal Grandmother     Social History   Socioeconomic History  . Marital status: Married    Spouse name: Barbara Cower  . Number of children: 2  . Years of education: Not on file  . Highest education level: Master's degree (e.g., MA, MS, MEng, MEd, MSW, MBA)  Occupational History  .  Occupation: Careers adviser: [employer]  Social Needs  . Financial resource strain: Not on file  . Food insecurity:    Worry: Not on file    Inability: Not on file  . Transportation needs:    Medical: Not on file    Non-medical: Not on file  Tobacco Use  . Smoking status: Former Smoker    Types: Cigarettes    Last attempt to quit: 02/25/2008    Years since quitting: 10.8  . Smokeless tobacco: Never Used  Substance and Sexual Activity  . Alcohol use: Yes    Comment: social  . Drug use: No  . Sexual activity: Yes    Birth control/protection: None, Condom  Lifestyle  . Physical activity:    Days per week: Not on file    Minutes per session: Not on file  . Stress: Not on file  Relationships  . Social connections:    Talks on phone: Not on file    Gets together: Not on file    Attends religious service: Not on file    Active member of club or organization: Not on file    Attends meetings of clubs or organizations: Not on file    Relationship status: Not on file  . Intimate partner violence:    Fear of current or ex partner: Not on file    Emotionally abused: Not on file    Physically abused: Not on file    Forced sexual activity: Not on file  Other Topics Concern  . Not on file  Social History Narrative   Patient is right-handed. She lives with her husband and two children in a one level home. She drinks 3 cups of coffee and 4 cans of soda a day. She walks her dog daily.     Current Outpatient Medications:  .  amLODipine (NORVASC) 5 MG tablet, Take 1 tablet (5 mg total) by mouth daily., Disp: 90 tablet, Rfl: 2 .  diazepam (VALIUM) 5 MG tablet, Take 1 tablet (5 mg total) by mouth daily as needed for anxiety., Disp: 30 tablet, Rfl: 1 .  fluticasone (FLONASE) 50 MCG/ACT nasal spray, Place 1 spray into both nostrils 2 (two) times daily. (Patient taking differently: Place 1 spray into both nostrils 2 (two) times daily as needed for allergies. ), Disp: 16 g, Rfl: 3 .   hydrochlorothiazide (HYDRODIURIL) 25 MG tablet, Take 1 tablet (25 mg total) by mouth daily., Disp: 90 tablet, Rfl: 1 .  levothyroxine (SYNTHROID, LEVOTHROID) 100 MCG tablet, TAKE 1 TABLET (100 MCG TOTAL) BY MOUTH DAILY BEFORE BREAKFAST., Disp: 90 tablet, Rfl: 3 .  OVER THE COUNTER MEDICATION, Take 1 tablet by mouth daily as needed. Sleep aid, Disp: , Rfl:  .  Topiramate ER (TROKENDI XR) 50 MG CP24, Take 50 mg by mouth daily., Disp: 30 capsule, Rfl: 11 .  venlafaxine XR (EFFEXOR-XR) 37.5 MG 24 hr capsule, TAKE 1 CAPSULE (37.5 MG TOTAL) BY MOUTH DAILY WITH BREAKFAST.,  Disp: 90 capsule, Rfl: 1  EXAM:  VITALS per patient if applicable:N/A  GENERAL: alert, oriented, appears well and in no acute distress  HEENT: atraumatic, conjunctiva clear, normal EOM bilateral,no obvious facial abnormalities on inspection.  NECK: normal movements of the head and neck  LUNGS: on inspection no signs of respiratory distress, breathing rate appears normal, no obvious gross SOB, gasping or wheezing  CV: no obvious cyanosis  MS: moves all visible extremities without noticeable abnormality  PSYCH/NEURO: pleasant and cooperative, no obvious depression or anxiety, speech and thought processing grossly intact  ASSESSMENT AND PLAN:  Discussed the following assessment and plan:  Hypothyroidism We have not checked TSH sine 07/2017. She is having blood work in about 2 weeks,so she was instructed to ask for a TSH. For now continue Levothyroxine 100 mcg daily.  HTN (hypertension) BP has been adequately controlled. Recommend monitoring BP more often. Continue HCTZ 25 mg and amlodipine 5 mg daily. Continue low-salt and K+ rich diet. Eye exam annually.  Anxiety disorder Problem is stable. Continue Valium 5 mg daily as needed, now this is being prescribed by her psychiatrist. Continue Effexor 37.5 mg daily. Continue following with psychotherapist weekly and with psychiatrist monthly.  Bipolar disorder (manic  depression) (HCC) Problem is better controlled. Continue following with psychiatrist monthly. Planning on checking lithium level in 2 weeks, recommend adding TSH. We discussed some side effects of lithium.   Headache, unspecified headache type Mammogram episodes have improved but still having daily milder headaches. Continue topiramate ER 50 mg daily. Keep appointment with neurologist in 02/2019. Instructed about warning signs.  Class 1 obesity with body mass index (BMI) of 33.0 to 33.9 in adult We discussed benefits of wt loss as well as adverse effects of obesity. Consistency with healthy diet and physical activity recommended.     Because she is following with psychiatrist and neurologist periodically, I think it is appropriate to see her annually as far as she is monitoring BP regularly. She will ask for TSH to be added to next blood work. I will see her back in 07/2019 for her CPE.  I discussed the assessment and treatment plan with the patient. She was provided an opportunity to ask questions and all were answered. The patient agreed with the plan and demonstrated an understanding of the instructions.     Return in about 31 weeks (around 07/26/2019) for cpe.    Betty [place], MD

## 2018-12-21 NOTE — Assessment & Plan Note (Signed)
We have not checked TSH sine 07/2017. She is having blood work in about 2 weeks,so she was instructed to ask for a TSH. For now continue Levothyroxine 100 mcg daily.

## 2018-12-21 NOTE — Assessment & Plan Note (Signed)
Mammogram episodes have improved but still having daily milder headaches. Continue topiramate ER 50 mg daily. Keep appointment with neurologist in 02/2019. Instructed about warning signs.

## 2018-12-21 NOTE — Assessment & Plan Note (Signed)
We discussed benefits of wt loss as well as adverse effects of obesity. Consistency with healthy diet and physical activity recommended.  

## 2018-12-21 NOTE — Assessment & Plan Note (Signed)
Problem is stable. Continue Valium 5 mg daily as needed, now this is being prescribed by her psychiatrist. Continue Effexor 37.5 mg daily. Continue following with psychotherapist weekly and with psychiatrist monthly.

## 2019-02-13 ENCOUNTER — Other Ambulatory Visit: Payer: Self-pay | Admitting: Family Medicine

## 2019-02-13 DIAGNOSIS — I10 Essential (primary) hypertension: Secondary | ICD-10-CM

## 2019-02-22 ENCOUNTER — Ambulatory Visit: Payer: BC Managed Care – PPO | Admitting: Neurology

## 2019-03-05 ENCOUNTER — Other Ambulatory Visit: Payer: Self-pay | Admitting: Family Medicine

## 2019-03-05 DIAGNOSIS — I10 Essential (primary) hypertension: Secondary | ICD-10-CM

## 2019-03-10 ENCOUNTER — Encounter: Payer: Self-pay | Admitting: Neurology

## 2019-03-13 ENCOUNTER — Other Ambulatory Visit: Payer: Self-pay

## 2019-03-13 ENCOUNTER — Ambulatory Visit (INDEPENDENT_AMBULATORY_CARE_PROVIDER_SITE_OTHER): Payer: BC Managed Care – PPO | Admitting: Neurology

## 2019-03-13 DIAGNOSIS — G43709 Chronic migraine without aura, not intractable, without status migrainosus: Secondary | ICD-10-CM | POA: Diagnosis not present

## 2019-03-13 DIAGNOSIS — R200 Anesthesia of skin: Secondary | ICD-10-CM | POA: Diagnosis not present

## 2019-03-13 NOTE — Progress Notes (Signed)
   Virtual Visit via Video Note The purpose of this virtual visit is to provide medical care while limiting exposure to the novel coronavirus.    Consent was obtained for video visit:  Yes.   Answered questions that patient had about telehealth interaction:  Yes.   I discussed the limitations, risks, security and privacy concerns of performing an evaluation and management service by telemedicine. I also discussed with the patient that there may be a patient responsible charge related to this service. The patient expressed understanding and agreed to proceed.  Pt location: Home Physician Location: office Name of referring provider:  Martinique, Betty G, MD I connected with Waynard Edwards at patients initiation/request on 03/13/2019 at  3:30 PM EDT by video enabled telemedicine application and verified that I am speaking with the correct person using two identifiers. Pt MRN:  [id number] Pt DOB:  28-Sep-1980 Video Participants:  Waynard Edwards   History of Present Illness: This is a 38 y.o. female returning for follow-up of migraine and left arm numbness.  She was transitioned to Trokendi 50mg  at her last visit which has improved the frequency, intensity, and duration of migraines.  Migraines now occur 1-2 times per month and typically last 1 hr which is improved from 3 hr.   She has noticed worsening left arm numbness.  It involves the upper arm and down into the left forearm and hand, as if there was a blood pressure cuff squeezing her circulation.  It is worse with repetitive activity. She was helping her parents more over the summer and feels that her left arm is getting weaker.   Observations/Objective:   Patient is awake, alert, and appears comfortable.  Oriented x 4.   Extraocular muscles are intact. No ptosis.  Face is symmetric.  Speech is not dysarthric. Tongue is midline. Antigravity in all extremities.  No pronator drift.   Assessment and Plan:  1.  Chronic migraine without  aura, improved to 1-2 migraine per month  - We discussed increasing her the dose of Trokendi since it has helped, but she would like to continue Trokendi 50mg  daily for now  - OK to take NSAIDs as needed for low intensity headaches.  Limit to twice per week  2.  Left arm paresthesias, does not confirm to a nerve distribution  - NCS/EMG of the left arm to characterize the nature of her symptoms   Follow Up Instructions:   I discussed the assessment and treatment plan with the patient. The patient was provided an opportunity to ask questions and all were answered. The patient agreed with the plan and demonstrated an understanding of the instructions.   The patient was advised to call back or seek an in-person evaluation if the symptoms worsen or if the condition fails to improve as anticipated.  Follow-up after testing  Total time spent:  25 minutes     Alda Berthold, DO

## 2019-04-20 ENCOUNTER — Ambulatory Visit (INDEPENDENT_AMBULATORY_CARE_PROVIDER_SITE_OTHER): Payer: BC Managed Care – PPO | Admitting: Neurology

## 2019-04-20 ENCOUNTER — Other Ambulatory Visit: Payer: Self-pay

## 2019-04-20 DIAGNOSIS — R200 Anesthesia of skin: Secondary | ICD-10-CM | POA: Diagnosis not present

## 2019-04-20 NOTE — Procedures (Signed)
[facility]  [place]  [place], [place] [address] Tel: [phone] Fax:  [phone] Test Date:  04/20/2019  Patient: Jennifer Mcmahon DOB: [birth year] Physician: Narda Amber, DO  Sex: Female Height: 5\' 6"  Ref Phys: Narda Amber, DO  ID#: [id number] Temp: 34.0C Technician:    Patient Complaints: This is a 38 year old female referred for evaluation of left forearm and hand paresthesias.  NCV & EMG Findings: Extensive electrodiagnostic testing of the left upper extremity shows:  1. Left median, ulnar, and mixed palmar sensory responses are within normal limits. 2. Left median and ulnar motor responses are within normal limits. 3. There is no evidence of active or chronic motor axonal loss changes affecting any of the tested muscles.  Motor unit configuration and recruitment pattern is within normal limits.  Impression: This is a normal study of the left upper extremity.  In particular, there is no evidence of carpal tunnel syndrome or cervical radiculopathy.   ___________________________ Narda Amber, DO    Nerve Conduction Studies Anti Sensory Summary Table   Site NR Peak (ms) Norm Peak (ms) P-T Amp (V) Norm P-T Amp  Left Median Anti Sensory (2nd Digit)  34C  Wrist    2.7 <3.4 49.0 >20  Left Ulnar Anti Sensory (5th Digit)  34C  Wrist    2.6 <3.1 26.6 >12   Motor Summary Table   Site NR Onset (ms) Norm Onset (ms) O-P Amp (mV) Norm O-P Amp Site1 Site2 Delta-0 (ms) Dist (cm) Vel (m/s) Norm Vel (m/s)  Left Median Motor (Abd Poll Brev)  34C  Wrist    2.7 <3.9 12.3 >6 Elbow Wrist 4.6 26.0 57 >50  Elbow    7.3  12.3         Left Ulnar Motor (Abd Dig Minimi)  34C  Wrist    2.2 <3.1 10.3 >7 B Elbow Wrist 3.6 23.0 64 >50  B Elbow    5.8  9.9  A Elbow B Elbow 1.7 10.0 59 >50  A Elbow    7.5  9.2          Comparison Summary Table   Site NR Peak (ms) Norm Peak (ms) P-T Amp (V) Site1 Site2 Delta-P (ms) Norm Delta (ms)  Left Median/Ulnar Palm  Comparison (Wrist - 8cm)  34C  Median Palm    1.6 <2.2 40.2 Median Palm Ulnar Palm 0.2   Ulnar Palm    1.4 <2.2 19.6       EMG   Side Muscle Ins Act Fibs Psw Fasc Number Recrt Dur Dur. Amp Amp. Poly Poly. Comment  Left 1stDorInt Nml Nml Nml Nml Nml Nml Nml Nml Nml Nml Nml Nml N/A  Left PronatorTeres Nml Nml Nml Nml Nml Nml Nml Nml Nml Nml Nml Nml N/A  Left Biceps Nml Nml Nml Nml Nml Nml Nml Nml Nml Nml Nml Nml N/A  Left Triceps Nml Nml Nml Nml Nml Nml Nml Nml Nml Nml Nml Nml N/A  Left Deltoid Nml Nml Nml Nml Nml Nml Nml Nml Nml Nml Nml Nml N/A      Waveforms:

## 2019-04-20 NOTE — Progress Notes (Signed)
Follow-up Visit   Date: 04/20/19   Jennifer Mcmahon MRN: [id number] DOB: 01/19/1981   Interim History: Jennifer Mcmahon is a 38 y.o. right-handed Caucasian female returning to the clinic for follow-up of episodic left arm numbness.  The patient was accompanied to the clinic by self.    UPDATE 04/20/2019:  She is here for follow-up and electrodiagnostic testing of the left arm.  She continues to have episodic numbness involving the upper arm down into the forearm.  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  Normally, symptoms do not bother her.  She has mild weakness when the numbness occurs.  Medications:  Current Outpatient Medications on File Prior to Visit  Medication Sig Dispense Refill  . amLODipine (NORVASC) 5 MG tablet TAKE 1 TABLET BY MOUTH EVERY DAY 90 tablet 2  . diazepam (VALIUM) 5 MG tablet Take 1 tablet (5 mg total) by mouth daily as needed for anxiety. 30 tablet 1  . fluticasone (FLONASE) 50 MCG/ACT nasal spray Place 1 spray into both nostrils 2 (two) times daily. (Patient not taking: Reported on 03/13/2019) 16 g 3  . hydrochlorothiazide (HYDRODIURIL) 25 MG tablet TAKE 1 TABLET BY MOUTH EVERY DAY (Patient not taking: Reported on 03/13/2019) 90 tablet 1  . levothyroxine (SYNTHROID, LEVOTHROID) 100 MCG tablet TAKE 1 TABLET (100 MCG TOTAL) BY MOUTH DAILY BEFORE BREAKFAST. 90 tablet 3  . lithium carbonate 300 MG capsule 300 mg. 300 mg in am / 600 mg in pm    . OVER THE COUNTER MEDICATION Take 1 tablet by mouth daily as needed. Sleep aid    . Topiramate ER (TROKENDI XR) 50 MG CP24 Take 50 mg by mouth daily. 30 capsule 11  . venlafaxine XR (EFFEXOR-XR) 37.5 MG 24 hr capsule TAKE 1 CAPSULE (37.5 MG TOTAL) BY MOUTH DAILY WITH BREAKFAST. 90 capsule 1   No current facility-administered medications on file prior to visit.     Allergies:  Allergies  Allergen Reactions  . Ramipril Cough    Review of Systems:   CONSTITUTIONAL: No fevers, chills, night sweats, or weight loss.  EYES: No visual changes or eye pain ENT: No hearing changes.  No history of nose bleeds.   RESPIRATORY: No cough, wheezing and shortness of breath.   CARDIOVASCULAR: Negative for chest pain, and palpitations.   GI: Negative for abdominal discomfort, blood in stools or black stools.  No recent change in bowel habits.   GU:  No history of incontinence.   MUSCLOSKELETAL: No history of joint pain or swelling.  No myalgias.   SKIN: Negative for lesions, rash, and itching.   ENDOCRINE: Negative for cold or heat intolerance, polydipsia or goiter.   PSYCH:  No depression or anxiety symptoms.   NEURO: As Above.   Neurological Exam: MENTAL STATUS including orientation to time, place, person, recent and remote memory, attention span and concentration, language, and fund of knowledge is normal.  Speech is not dysarthric.  CRANIAL NERVES:  .  Normal conjugate, extra-ocular eye movements in all directions of gaze.  No ptosis .  Face is symmetric.  MOTOR:  Motor strength is 5/5 in all extremities.  No atrophy, fasciculations or abnormal movements.  No pronator drift.  Tone is normal.    MSRs:  Reflexes are 2+/4 throughout  SENSORY:  Intact to temperature throughout.  COORDINATION/GAIT: Gait narrow based and stable.   Data: NCS/EMG of the right arm 04/20/2019:  Normal  IMPRESSION/PLAN: Left  arm numbness, intermittent and mild.  Patient was reassured that there is no evidence of carpal tunnel syndrome or cervical radiculopathy.  I offered neck physical therapy to see if this helps, she would like to think about this and will contact my office, should she decide to proceed with the referral or if symptoms get worse..  Greater than 50% of this 15 minute visit was spent in counseling, explanation of diagnosis, planning of further management, and coordination of care.    Thank you for allowing me to participate in patient's care.  If I  can answer any additional questions, I would be pleased to do so.    Sincerely,    Stefanee Mckell K. Posey Pronto, DO

## 2019-06-13 ENCOUNTER — Other Ambulatory Visit: Payer: Self-pay | Admitting: Family Medicine

## 2019-06-13 DIAGNOSIS — F411 Generalized anxiety disorder: Secondary | ICD-10-CM

## 2019-06-13 NOTE — Telephone Encounter (Signed)
Forwarded to PCP for approval

## 2019-08-13 ENCOUNTER — Other Ambulatory Visit: Payer: Self-pay | Admitting: Neurology

## 2019-09-24 ENCOUNTER — Other Ambulatory Visit: Payer: Self-pay | Admitting: Family Medicine

## 2019-09-24 DIAGNOSIS — E039 Hypothyroidism, unspecified: Secondary | ICD-10-CM

## 2019-12-04 ENCOUNTER — Other Ambulatory Visit: Payer: Self-pay | Admitting: Family Medicine

## 2019-12-04 DIAGNOSIS — I10 Essential (primary) hypertension: Secondary | ICD-10-CM

## 2020-03-11 ENCOUNTER — Other Ambulatory Visit: Payer: Self-pay

## 2020-03-11 ENCOUNTER — Encounter: Payer: Self-pay | Admitting: Family Medicine

## 2020-03-11 ENCOUNTER — Ambulatory Visit: Payer: BC Managed Care – PPO | Admitting: Family Medicine

## 2020-03-11 VITALS — BP 130/80 | HR 99 | Temp 98.3°F | Resp 12 | Ht 66.0 in | Wt 246.0 lb

## 2020-03-11 DIAGNOSIS — R1013 Epigastric pain: Secondary | ICD-10-CM | POA: Diagnosis not present

## 2020-03-11 DIAGNOSIS — K219 Gastro-esophageal reflux disease without esophagitis: Secondary | ICD-10-CM | POA: Diagnosis not present

## 2020-03-11 DIAGNOSIS — R197 Diarrhea, unspecified: Secondary | ICD-10-CM

## 2020-03-11 DIAGNOSIS — I10 Essential (primary) hypertension: Secondary | ICD-10-CM | POA: Diagnosis not present

## 2020-03-11 MED ORDER — PANTOPRAZOLE SODIUM 40 MG PO TBEC: 40.0000 mg | DELAYED_RELEASE_TABLET | Freq: Every day | ORAL | 3 refills | Status: DC

## 2020-03-11 NOTE — Patient Instructions (Addendum)
A few things to remember from today's visit:   Diarrhea, unspecified type - Plan: CBC with Differential/Platelet, Comprehensive metabolic panel, C-reactive protein, Sedimentation rate  Epigastric pain  Abdominal Bloating When you have abdominal bloating, your abdomen may feel full, tight, or painful. It may also look bigger than normal or swollen (distended). Common causes of abdominal bloating include:  Swallowing air.  Constipation.  Problems digesting food.  Eating too much.  Irritable bowel syndrome. This is a condition that affects the large intestine.  Lactose intolerance. This is an inability to digest lactose, a natural sugar in dairy products.  Celiac disease. This is a condition that affects the ability to digest gluten, a protein found in some grains.  Gastroparesis. This is a condition that slows down the movement of food in the stomach and small intestine. It is more common in people with diabetes mellitus.  Gastroesophageal reflux disease (GERD). This is a digestive condition that makes stomach acid flow back into the esophagus.  Urinary retention. This means that the body is holding onto urine, and the bladder cannot be emptied all the way. Follow these instructions at home: Eating and drinking  Avoid eating too much.  Try not to swallow air while talking or eating.  Avoid eating while lying down.  Avoid these foods and drinks: ? Foods that cause gas, such as broccoli, cabbage, cauliflower, and baked beans. ? Carbonated drinks. ? Hard candy. ? Chewing gum. Medicines  Take over-the-counter and prescription medicines only as told by your health care provider.  Take probiotic medicines. These medicines contain live bacteria or yeasts that can help digestion.  Take coated peppermint oil capsules. Activity  Try to exercise regularly. Exercise may help to relieve bloating that is caused by gas and relieve constipation. General instructions  Keep all  follow-up visits as told by your health care provider. This is important. Contact a health care provider if:  You have nausea and vomiting.  You have diarrhea.  You have abdominal pain.  You have unusual weight loss or weight gain.  You have severe pain, and medicines do not help. Get help right away if:  You have severe chest pain.  You have trouble breathing.  You have shortness of breath.  You have trouble urinating.  You have darker urine than normal.  You have blood in your stools or have dark, tarry stools. Summary  Abdominal bloating means that the abdomen is swollen.  Common causes of abdominal bloating are swallowing air, constipation, and problems digesting food.  Avoid eating too much and avoid swallowing air.  Avoid foods that cause gas, carbonated drinks, hard candy, and chewing gum. This information is not intended to replace advice given to you by your health care provider. Make sure you discuss any questions you have with your health care provider. Document Revised: 11/07/2018 Document Reviewed: 08/21/2016 Elsevier Patient Education  [employer].  If you need refills please call your pharmacy. Do not use My Chart to request refills or for acute issues that need immediate attention.    Please be sure medication list is accurate. If a new problem present, please set up appointment sooner than planned today.

## 2020-03-11 NOTE — Progress Notes (Addendum)
Chief Complaint  Patient presents with  . stomach issue   HPI: Jennifer Mcmahon is a 39 y.o. female, who is here today complaining of a few months of bloating sensation and upper abdominal pain, "gas" pain. Constant epigastric cramps, no radiated, 6-7/10. Watery stools, sometimes soft. Today she has had 3 stools, max 6/day.  She has had mild fecal incontinence when passing gas.  She has made changes in her diet but still symptomatic. Meat seemed to be the main exacerbating factor. Anxiety also aggravates problem. She has not noted blood in stool but sometimes it is very dark,like coffee. She is now following a vegetarian diet. + Burping and heartburn. She has tried her father's Prilosec and helped some a few months ago.  Defecation and burping sometimes help with pain.  Menstrual periods aggravates diarrhea. + Heavy flow and dysmenorrhea.  Negative for sick contact,abx use, or recent travel. FHx negative for IBD or celiac.  TSH was checked by her gyn in 08/2019 and in normal range.  HTN: He stopped medication since 09/2019. BP has been 130-135/70's. Negative for worsening headache, visual changes, chest pain, dyspnea, palpitation,focal weakness, or edema.  Lab Results  Component Value Date   CREATININE 1.06 08/08/2018   BUN 17 08/08/2018   NA 138 08/08/2018   K 3.8 08/08/2018   CL 104 08/08/2018   CO2 26 08/08/2018   Review of Systems  Constitutional: Positive for appetite change and fatigue. Negative for chills, fever and unexpected weight change.  HENT: Negative for mouth sores, nosebleeds, sore throat and trouble swallowing.   Respiratory: Negative for cough and wheezing.   Gastrointestinal: Negative for nausea and vomiting.  Endocrine: Negative for cold intolerance and heat intolerance.  Genitourinary: Negative for decreased urine volume, dysuria and hematuria.  Musculoskeletal: Negative for gait problem and myalgias.  Skin: Negative for pallor and  rash.  Neurological: Negative for syncope and weakness.  Rest see pertinent positives and negatives per HPI.  Current Outpatient Medications on File Prior to Visit  Medication Sig Dispense Refill  . diazepam (VALIUM) 5 MG tablet Take 1 tablet (5 mg total) by mouth daily as needed for anxiety. 30 tablet 1  . levothyroxine (SYNTHROID) 100 MCG tablet TAKE 1 TABLET BY MOUTH DAILY BEFORE BREAKFAST. 90 tablet 1  . lithium carbonate 300 MG capsule 300 mg. 300 mg in am / 600 mg in pm    . OVER THE COUNTER MEDICATION Take 1 tablet by mouth daily as needed. Sleep aid     No current facility-administered medications on file prior to visit.   Past Medical History:  Diagnosis Date  . Anemia   . Anxiety    postpartum  . Depression    post partum  . Gestational diabetes    diet controlled  . Hypercalcemia   . Hyperlipidemia   . Hypertension    not on high blood pressure meds anymore  . Hypothyroidism   . PE (pulmonary embolism) 2004  . Postpartum care following vaginal delivery (11/15) 06/18/2014  . Thyroid disease    hypothyroidism  . Vitamin D deficiency    Allergies  Allergen Reactions  . Ramipril Cough   Social History   Socioeconomic History  . Marital status: Married    Spouse name: Jennifer Mcmahon  . Number of children: 2  . Years of education: 81  . Highest education level: Master's degree (e.g., MA, MS, MEng, MEd, MSW, MBA)  Occupational History  . Occupation: Careers adviser: [employer]  Tobacco Use  . Smoking status: Former Smoker    Types: Cigarettes    Quit date: 02/25/2008    Years since quitting: 12.0  . Smokeless tobacco: Never Used  Vaping Use  . Vaping Use: Never used  Substance and Sexual Activity  . Alcohol use: Yes    Comment: social  . Drug use: No  . Sexual activity: Yes    Birth control/protection: None, Condom  Other Topics Concern  . Not on file  Social History Narrative   Patient is right-handed. She lives with her husband and two children in  a one level home. She drinks 3 cups of coffee and 4 cans of soda a day. She walks her dog daily.   Social Determinants of Health   Financial Resource Strain:   . Difficulty of Paying Living Expenses:   Food Insecurity:   . Worried About Programme researcher, broadcasting/film/video in the Last Year:   . Barista in the Last Year:   Transportation Needs:   . Freight forwarder (Medical):   Marland Kitchen Lack of Transportation (Non-Medical):   Physical Activity:   . Days of Exercise per Week:   . Minutes of Exercise per Session:   Stress:   . Feeling of Stress :   Social Connections:   . Frequency of Communication with Friends and Family:   . Frequency of Social Gatherings with Friends and Family:   . Attends Religious Services:   . Active Member of Clubs or Organizations:   . Attends Banker Meetings:   Marland Kitchen Marital Status:    Vitals:   03/11/20 1611  BP: 130/80  Pulse: 99  Temp: 98.3 F (36.8 C)  SpO2: 100%   Wt Readings from Last 3 Encounters:  03/11/20 246 lb (111.6 kg)  07/22/18 214 lb 6 oz (97.2 kg)  07/13/18 206 lb (93.4 kg)   Body mass index is 39.71 kg/m.  Physical Exam Constitutional:      General: She is not in acute distress.    Appearance: She is well-developed. She is not ill-appearing.  HENT:     Head: Normocephalic and atraumatic.     Mouth/Throat:     Mouth: Mucous membranes are moist.     Pharynx: Oropharynx is clear.  Eyes:     General: No scleral icterus.    Conjunctiva/sclera: Conjunctivae normal.  Cardiovascular:     Rate and Rhythm: Normal rate and regular rhythm.     Heart sounds: No murmur heard.   Pulmonary:     Effort: Pulmonary effort is normal. No respiratory distress.     Breath sounds: Normal breath sounds.  Abdominal:     General: Bowel sounds are normal. There is no distension.     Palpations: Abdomen is soft. There is no hepatomegaly or mass.     Tenderness: There is abdominal tenderness in the epigastric area. There is no guarding or  rebound.  Genitourinary:    Rectum: Guaiac result negative. No mass, anal fissure or internal hemorrhoid.  Lymphadenopathy:     Cervical: No cervical adenopathy.  Skin:    General: Skin is warm.     Findings: No erythema or rash.  Neurological:     Mental Status: She is alert and oriented to person, place, and time.  Psychiatric:     Comments: Well groomed, good eye contact.    ASSESSMENT AND PLAN:  Jennifer Mcmahon was seen today for stomach issue.  Diagnoses and all orders for this visit:  Orders Placed  This Encounter  Procedures  . CBC with Differential/Platelet  . Comprehensive metabolic panel  . C-reactive protein  . Sedimentation rate  . Celiac Disease Panel   Lab Results  Component Value Date   WBC 14.1 (H) 03/11/2020   HGB 13.9 03/11/2020   HCT 43.1 03/11/2020   MCV 89.0 03/11/2020   PLT 300 03/11/2020   Lab Results  Component Value Date   CRP 7.5 03/11/2020   Lab Results  Component Value Date   CREATININE 1.08 03/11/2020   BUN 15 03/11/2020   NA 136 03/11/2020   K 4.4 03/11/2020   CL 103 03/11/2020   CO2 20 03/11/2020   Lab Results  Component Value Date   ESRSEDRATE 9 03/11/2020   Lab Results  Component Value Date   ALT 19 03/11/2020   AST 14 03/11/2020   ALKPHOS 49 04/30/2016   BILITOT 0.3 03/11/2020   Epigastric pain Possible etiologies discussed.  For now I do not think imaging is needed. PPI has helped in the past.  Diarrhea, unspecified type ? IBS among other possible causes discussed. Low residue diet and adequate hydration. Further recommendations according to lab results. Doxepin and Questran are treatment options OR GI referral.  Gastroesophageal reflux disease, unspecified whether esophagitis present GERD precautions. She agrees with trying PPI trial.  -     pantoprazole (PROTONIX) 40 MG tablet; Take 1 tablet (40 mg total) by mouth daily.  Essential hypertension Continue non pharmacologic treatment and monitoring BP  regularly.  Return in about 4 weeks (around 04/08/2020).   Carsin Randazzo G. Swazil[place]Jordan, MD  [facility]. [place] office.  Discharge Instructions       A few things to remember from today's visit:   Diarrhea, unspecified type - Plan: CBC with Differential/Platelet, Comprehensive metabolic panel, C-reactive protein, Sedimentation rate  Epigastric pain  Abdominal Bloating When you have abdominal bloating, your abdomen may feel full, tight, or painful. It may also look bigger than normal or swollen (distended). Common causes of abdominal bloating include:  Swallowing air.  Constipation.  Problems digesting food.  Eating too much.  Irritable bowel syndrome. This is a condition that affects the large intestine.  Lactose intolerance. This is an inability to digest lactose, a natural sugar in dairy products.  Celiac disease. This is a condition that affects the ability to digest gluten, a protein found in some grains.  Gastroparesis. This is a condition that slows down the movement of food in the stomach and small intestine. It is more common in people with diabetes mellitus.  Gastroesophageal reflux disease (GERD). This is a digestive condition that makes stomach acid flow back into the esophagus.  Urinary retention. This means that the body is holding onto urine, and the bladder cannot be emptied all the way. Follow these instructions at home: Eating and drinking  Avoid eating too much.  Try not to swallow air while talking or eating.  Avoid eating while lying down.  Avoid these foods and drinks: ? Foods that cause gas, such as broccoli, cabbage, cauliflower, and baked beans. ? Carbonated drinks. ? Hard candy. ? Chewing gum. Medicines  Take over-the-counter and prescription medicines only as told by your health care provider.  Take probiotic medicines. These medicines contain live bacteria or yeasts that can help digestion.  Take coated peppermint oil  capsules. Activity  Try to exercise regularly. Exercise may help to relieve bloating that is caused by gas and relieve constipation. General instructions  Keep all follow-up visits as told by your  health care provider. This is important. Contact a health care provider if:  You have nausea and vomiting.  You have diarrhea.  You have abdominal pain.  You have unusual weight loss or weight gain.  You have severe pain, and medicines do not help. Get help right away if:  You have severe chest pain.  You have trouble breathing.  You have shortness of breath.  You have trouble urinating.  You have darker urine than normal.  You have blood in your stools or have dark, tarry stools. Summary  Abdominal bloating means that the abdomen is swollen.  Common causes of abdominal bloating are swallowing air, constipation, and problems digesting food.  Avoid eating too much and avoid swallowing air.  Avoid foods that cause gas, carbonated drinks, hard candy, and chewing gum. This information is not intended to replace advice given to you by your health care provider. Make sure you discuss any questions you have with your health care provider. Document Revised: 11/07/2018 Document Reviewed: 08/21/2016 Elsevier Patient Education  [employer].  If you need refills please call your pharmacy. Do not use My Chart to request refills or for acute issues that need immediate attention.    Please be sure medication list is accurate. If a new problem present, please set up appointment sooner than planned today.

## 2020-03-12 LAB — CBC WITH DIFFERENTIAL/PLATELET
Absolute Monocytes: 733 cells/uL (ref 200–950)
Basophils Absolute: 56 cells/uL (ref 0–200)
Basophils Relative: 0.4 %
Eosinophils Absolute: 353 cells/uL (ref 15–500)
Eosinophils Relative: 2.5 %
HCT: 43.1 % (ref 35.0–45.0)
Hemoglobin: 13.9 g/dL (ref 11.7–15.5)
Lymphs Abs: 3003 cells/uL (ref 850–3900)
MCH: 28.7 pg (ref 27.0–33.0)
MCHC: 32.3 g/dL (ref 32.0–36.0)
MCV: 89 fL (ref 80.0–100.0)
MPV: 11.1 fL (ref 7.5–12.5)
Monocytes Relative: 5.2 %
Neutro Abs: 9955 cells/uL — ABNORMAL HIGH (ref 1500–7800)
Neutrophils Relative %: 70.6 %
Platelets: 300 10*3/uL (ref 140–400)
RBC: 4.84 10*6/uL (ref 3.80–5.10)
RDW: 12.3 % (ref 11.0–15.0)
Total Lymphocyte: 21.3 %
WBC: 14.1 10*3/uL — ABNORMAL HIGH (ref 3.8–10.8)

## 2020-03-12 LAB — C-REACTIVE PROTEIN: CRP: 7.5 mg/L (ref <8.0)

## 2020-03-12 LAB — COMPREHENSIVE METABOLIC PANEL
AG Ratio: 1.3 (calc) (ref 1.0–2.5)
ALT: 19 U/L (ref 6–29)
AST: 14 U/L (ref 10–30)
Albumin: 4.1 g/dL (ref 3.6–5.1)
Alkaline phosphatase (APISO): 57 U/L (ref 31–125)
BUN: 15 mg/dL (ref 7–25)
CO2: 20 mmol/L (ref 20–32)
Calcium: 10 mg/dL (ref 8.6–10.2)
Chloride: 103 mmol/L (ref 98–110)
Creat: 1.08 mg/dL (ref 0.50–1.10)
Globulin: 3.2 g/dL (calc) (ref 1.9–3.7)
Glucose, Bld: 183 mg/dL — ABNORMAL HIGH (ref 65–99)
Potassium: 4.4 mmol/L (ref 3.5–5.3)
Sodium: 136 mmol/L (ref 135–146)
Total Bilirubin: 0.3 mg/dL (ref 0.2–1.2)
Total Protein: 7.3 g/dL (ref 6.1–8.1)

## 2020-03-12 LAB — CELIAC DISEASE PANEL
(tTG) Ab, IgA: 1 U/mL
(tTG) Ab, IgG: 1 U/mL
Gliadin IgA: 4 Units
Gliadin IgG: <1 Units
Immunoglobulin A: 170 mg/dL (ref 47–310)

## 2020-03-12 LAB — SEDIMENTATION RATE: Sed Rate: 9 mm/h (ref 0–20)

## 2020-03-14 ENCOUNTER — Encounter: Payer: Self-pay | Admitting: Family Medicine

## 2020-03-18 IMAGING — MR MR HEAD W/O CM
10 series · 48 of 48 positions shown · non-contrast
Comparison: None.

CLINICAL DATA: Headache associated with orgasm.

EXAM:
MRI HEAD WITHOUT CONTRAST
TECHNIQUE: Multiplanar, multiecho pulse sequences of the brain and surrounding
structures were obtained without intravenous contrast.

[Series 5: T1 · sagittal · 4.0mm · 0.75mm/px · 1 of 31 slices shown (1 of 2)]
[im 1/31]
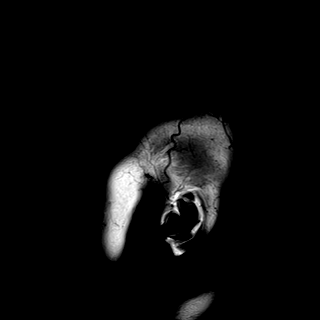

[Series 6: DWI · axial · 3.0mm · 1.50mm/px · z∈[-63,+84]mm · 7 of 92 slices shown (1 of 4)]
[im 1/92]
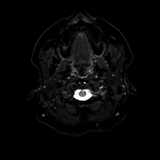
[im 16/92]
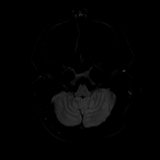
[im 31/92]
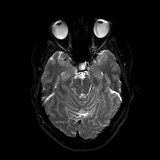
[im 46/92]
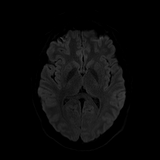
[im 61/92]
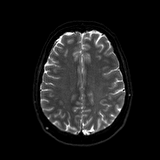
[im 76/92]
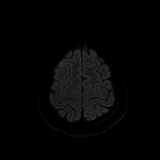
[im 92/92]
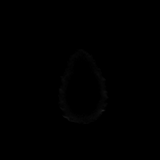

[Series 7: DWI · axial · 3.0mm · 1.50mm/px · z∈[-63,+84]mm · 4 of 46 slices shown (2 of 4)]
[im 1/46]
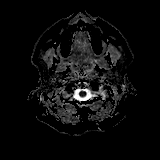
[im 16/46]
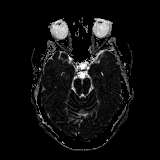
[im 31/46]
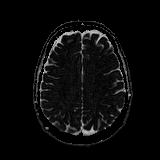
[im 46/46]
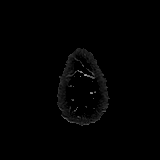

[Series 8: DWI · coronal · 5.0mm · 1.44mm/px · 5 of 66 slices shown (3 of 4)]
[im 1/66]
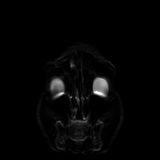
[im 17/66]
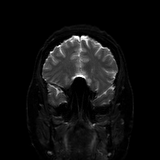
[im 33/66]
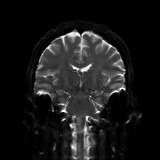
[im 49/66]
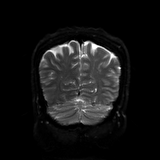
[im 66/66]
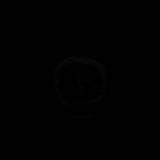

[Series 9: DWI · coronal · 5.0mm · 1.44mm/px · 3 of 33 slices shown (4 of 4)]
[im 1/33]
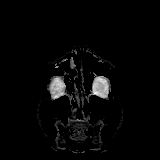
[im 17/33]
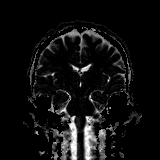
[im 33/33]
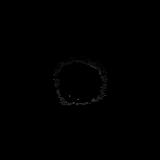

[Series 10: T2 · axial · 4.0mm · 0.36mm/px · z∈[-66,+83]mm · 2 of 30 slices shown (1 of 2)]
[im 1/30]
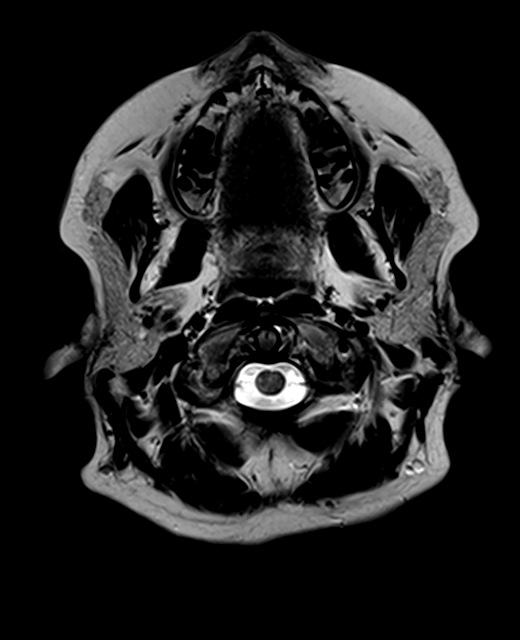
[im 30/30]
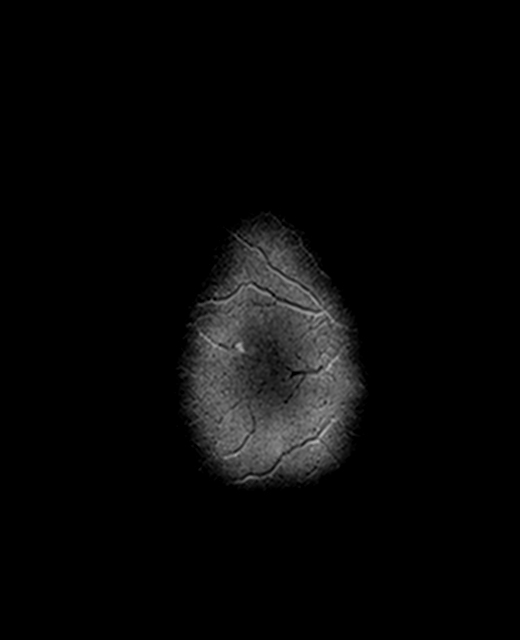

[Series 11: FLAIR · axial · 3.0mm · 0.72mm/px · z∈[-66,+82]mm · 2 of 26 slices shown]
[im 1/26]
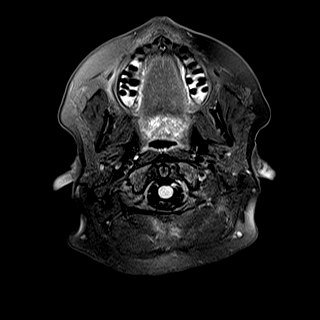
[im 26/26]
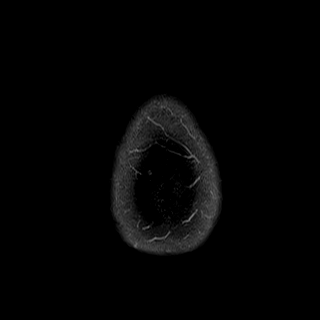

[Series 13: swi_images · axial · 1.5mm · 0.90mm/px · z∈[-62,+79]mm · 8 of 96 slices shown]
[im 1/96]
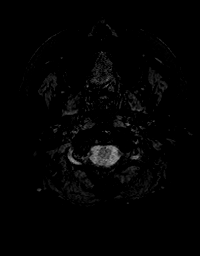
[im 14/96]
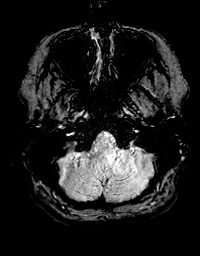
[im 28/96]
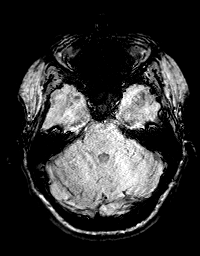
[im 41/96]
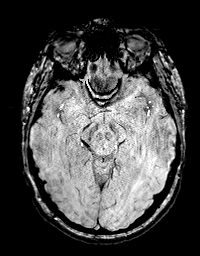
[im 55/96]
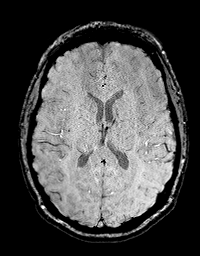
[im 68/96]
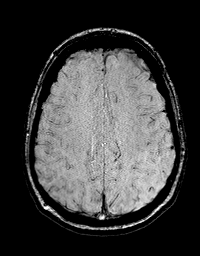
[im 82/96]
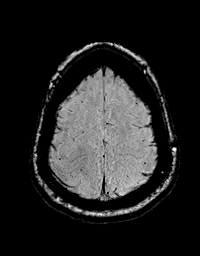
[im 96/96]
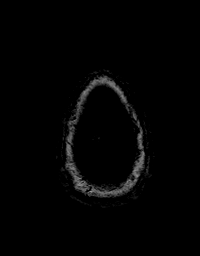

[Series 14: T1 · axial · 1.0mm · 0.90mm/px · z∈[-66,+90]mm · 13 of 160 slices shown (2 of 2)]
[im 1/160]
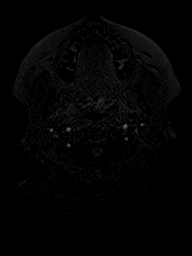
[im 14/160]
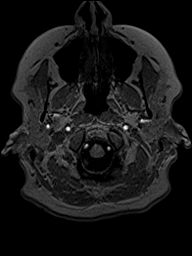
[im 27/160]
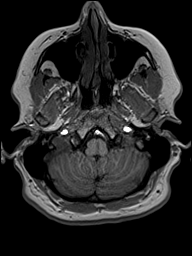
[im 40/160]
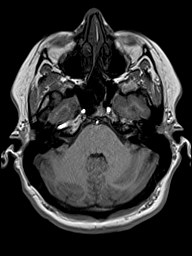
[im 54/160]
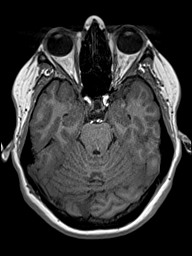
[im 67/160]
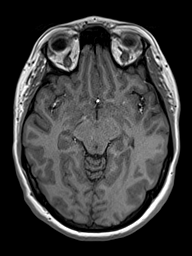
[im 80/160]
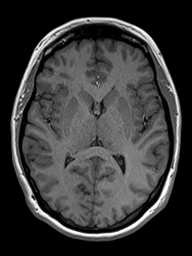
[im 93/160]
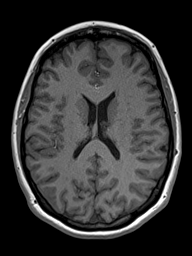
[im 107/160]
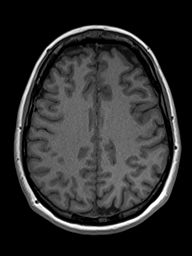
[im 120/160]
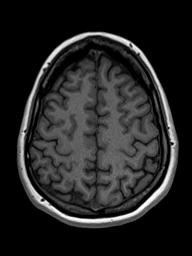
[im 133/160]
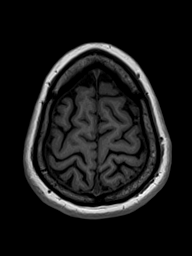
[im 146/160]
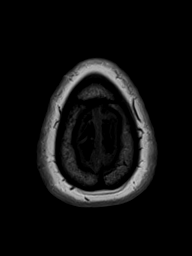
[im 160/160]
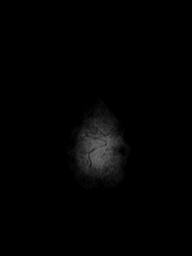

[Series 15: T2 · coronal · 4.5mm · 0.36mm/px · 3 of 33 slices shown (2 of 2)]
[im 1/33]
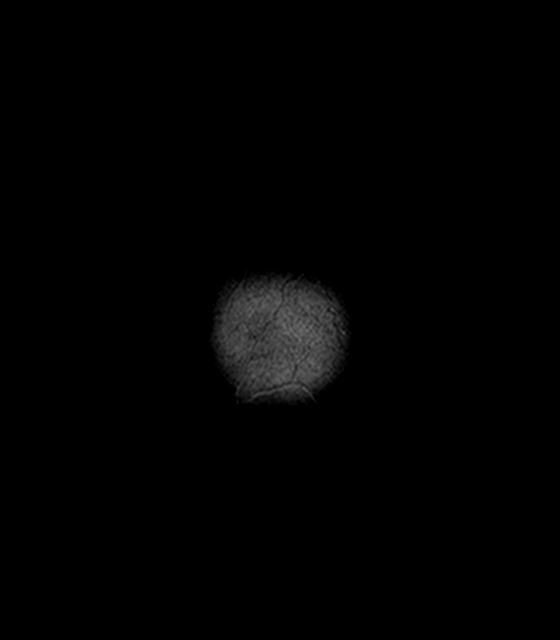
[im 17/33]
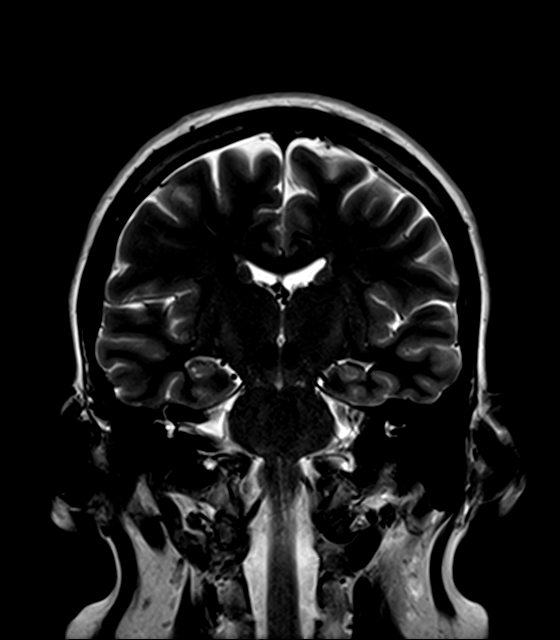
[im 33/33]
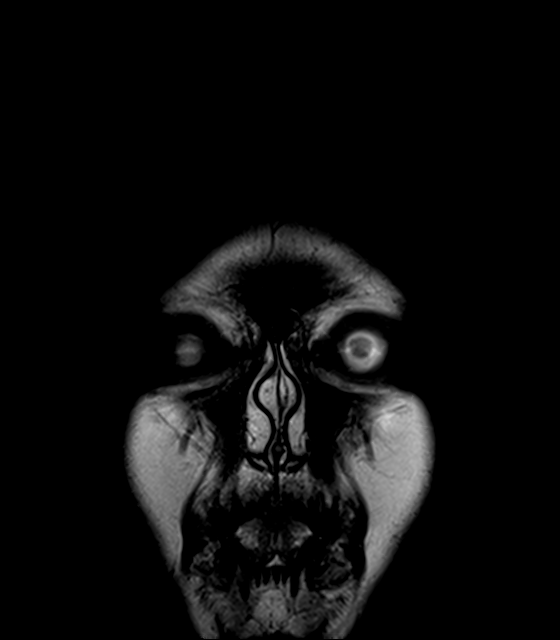

[48 of 48 positions shown; findings below may reference images not displayed]

FINDINGS: Brain: Ventricle size and cerebral volume normal. Negative for acute
infarct. Negative for hemorrhage mass or edema. Solitary small
hyperintensity right posterior frontal white matter. Remaining white
matter normal. Brainstem and basal ganglia normal.

Vascular: Normal arterial flow voids

Skull and upper cervical spine: Negative

Sinuses/Orbits: Mild mucosal edema in the frontal sinus. Normal
orbit

Other: None
IMPRESSION: Solitary small hyperintensity right frontal white matter otherwise
within normal limits. Within normal limits for age.

## 2020-03-20 ENCOUNTER — Other Ambulatory Visit: Payer: BC Managed Care – PPO

## 2020-03-31 ENCOUNTER — Other Ambulatory Visit: Payer: Self-pay | Admitting: Family Medicine

## 2020-03-31 DIAGNOSIS — E039 Hypothyroidism, unspecified: Secondary | ICD-10-CM

## 2020-10-11 ENCOUNTER — Other Ambulatory Visit: Payer: Self-pay | Admitting: Family Medicine

## 2020-10-11 DIAGNOSIS — E039 Hypothyroidism, unspecified: Secondary | ICD-10-CM

## 2020-12-03 ENCOUNTER — Encounter: Payer: Self-pay | Admitting: Family Medicine

## 2020-12-03 ENCOUNTER — Ambulatory Visit (INDEPENDENT_AMBULATORY_CARE_PROVIDER_SITE_OTHER): Payer: BC Managed Care – PPO | Admitting: Family Medicine

## 2020-12-03 ENCOUNTER — Other Ambulatory Visit: Payer: Self-pay

## 2020-12-03 VITALS — BP 130/70 | HR 98 | Temp 98.4°F | Resp 16 | Ht 66.0 in | Wt 244.2 lb

## 2020-12-03 DIAGNOSIS — E039 Hypothyroidism, unspecified: Secondary | ICD-10-CM | POA: Diagnosis not present

## 2020-12-03 DIAGNOSIS — G2581 Restless legs syndrome: Secondary | ICD-10-CM

## 2020-12-03 DIAGNOSIS — R251 Tremor, unspecified: Secondary | ICD-10-CM

## 2020-12-03 DIAGNOSIS — M791 Myalgia, unspecified site: Secondary | ICD-10-CM | POA: Diagnosis not present

## 2020-12-03 LAB — BASIC METABOLIC PANEL
BUN: 13 mg/dL (ref 6–23)
CO2: 22 mEq/L (ref 19–32)
Calcium: 9.7 mg/dL (ref 8.4–10.5)
Chloride: 106 mEq/L (ref 96–112)
Creatinine, Ser: 1.11 mg/dL (ref 0.40–1.20)
GFR: 61.6 mL/min (ref >60.00)
Glucose, Bld: 107 mg/dL — ABNORMAL HIGH (ref 70–99)
Potassium: 4.4 mEq/L (ref 3.5–5.1)
Sodium: 136 mEq/L (ref 135–145)

## 2020-12-03 LAB — CBC
HCT: 41.6 % (ref 36.0–46.0)
Hemoglobin: 13.7 g/dL (ref 12.0–15.0)
MCHC: 33 g/dL (ref 30.0–36.0)
MCV: 86.3 fl (ref 78.0–100.0)
Platelets: 259 10*3/uL (ref 150.0–400.0)
RBC: 4.82 Mil/uL (ref 3.87–5.11)
RDW: 13.6 % (ref 11.5–15.5)
WBC: 10 10*3/uL (ref 4.0–10.5)

## 2020-12-03 LAB — TSH: TSH: 4.43 u[IU]/mL (ref 0.35–4.50)

## 2020-12-03 LAB — CK: Total CK: 36 U/L (ref 7–177)

## 2020-12-03 LAB — T4, FREE: Free T4: 0.92 ng/dL (ref 0.60–1.60)

## 2020-12-03 MED ORDER — ROPINIROLE HCL 0.5 MG PO TABS
0.5000 mg | ORAL_TABLET | Freq: Every day | ORAL | 0 refills | Status: DC
Start: 2020-12-03 — End: 2021-01-30

## 2020-12-03 NOTE — Progress Notes (Signed)
Chief Complaint  Patient presents with  . restless leg and arm   HPI: Jennifer Mcmahon is a 40 y.o. female, who is here today with above complaint. She was last seen on 03/11/20. Lower and sometimes upper extremities "vibration" like sensation, she cannot explain discomfort but it is not cramp. Problem has been going on for about 2 years but seems to be getting worse. She has not identified exacerbating factors, it happens when she is in bed most of the time. Alleviated by movement. She has not noted edema,erythema,skin rash,or cyanosis. No new medications.  She takes Tylenol and Advil, has applied Biofreeze. These help to "minimize" discomfort.  Lab Results  Component Value Date   TSH 3.63 07/19/2017   She is eating healthier, small 3 meals per day. She is not exercising regularly.  She is also concerned about hand tremor. She has had problem for years. She feels like it is getting worse. It is not present at rest. More noticeable when writing and holding a cup. It is not interfering with eating. No new medications. She is on Lithium for bipolar disorder and takes Levothyroxine 100 mcg daily for hypothyroidism. She has not noted palpitations,heat/cold intolerance. Last TSH in 07/2017 3.6.  Hx of migraines, problem has improved.  Review of Systems  Constitutional: Negative for activity change, appetite change and fever.  HENT: Negative for mouth sores, nosebleeds and sore throat.   Respiratory: Negative for cough, shortness of breath and wheezing.   Cardiovascular: Negative for chest pain and leg swelling.  Gastrointestinal: Negative for abdominal pain, nausea and vomiting.       Negative for changes in bowel habits.  Genitourinary: Negative for decreased urine volume and hematuria.  Neurological: Negative for syncope, facial asymmetry and weakness.  Psychiatric/Behavioral: Positive for sleep disturbance. Negative for confusion. The patient is nervous/anxious.    Rest see pertinent positives and negatives per HPI.  Current Outpatient Medications on File Prior to Visit  Medication Sig Dispense Refill  . levothyroxine (SYNTHROID) 100 MCG tablet TAKE 1 TABLET BY MOUTH DAILY BEFORE BREAKFAST. 90 tablet 0  . lithium carbonate 300 MG capsule 300 mg. 300 mg in am / 600 mg in pm    . OVER THE COUNTER MEDICATION Take 1 tablet by mouth daily as needed. Sleep aid    . pantoprazole (PROTONIX) 40 MG tablet Take 1 tablet (40 mg total) by mouth daily. 30 tablet 3   No current facility-administered medications on file prior to visit.   Past Medical History:  Diagnosis Date  . Anemia   . Anxiety    postpartum  . Depression    post partum  . Gestational diabetes    diet controlled  . Hypercalcemia   . Hyperlipidemia   . Hypertension    not on high blood pressure meds anymore  . Hypothyroidism   . PE (pulmonary embolism) 2004  . Postpartum care following vaginal delivery (11/15) 06/18/2014  . Thyroid disease    hypothyroidism  . Vitamin D deficiency    Allergies  Allergen Reactions  . Ramipril Cough    Social History   Socioeconomic History  . Marital status: Married    Spouse name: Barbara Cower  . Number of children: 2  . Years of education: 46  . Highest education level: Master's degree (e.g., MA, MS, MEng, MEd, MSW, MBA)  Occupational History  . Occupation: Careers adviser: [employer]  Tobacco Use  . Smoking status: Former Smoker    Types: Cigarettes  Quit date: 02/25/2008    Years since quitting: 12.7  . Smokeless tobacco: Never Used  Vaping Use  . Vaping Use: Never used  Substance and Sexual Activity  . Alcohol use: Yes    Comment: social  . Drug use: No  . Sexual activity: Yes    Birth control/protection: None, Condom  Other Topics Concern  . Not on file  Social History Narrative   Patient is right-handed. She lives with her husband and two children in a one level home. She drinks 3 cups of coffee and 4 cans of soda a  day. She walks her dog daily.   Social Determinants of Health   Financial Resource Strain: Not on file  Food Insecurity: Not on file  Transportation Needs: Not on file  Physical Activity: Not on file  Stress: Not on file  Social Connections: Not on file   Vitals:   12/03/20 1204  BP: 130/70  Pulse: 98  Resp: 16  Temp: 98.4 F (36.9 C)  SpO2: 98%   Body mass index is 39.41 kg/m.  Physical Exam Vitals and nursing note reviewed.  Constitutional:      General: She is not in acute distress.    Appearance: She is well-developed.  HENT:     Head: Normocephalic and atraumatic.     Mouth/Throat:     Mouth: Mucous membranes are moist.     Pharynx: Oropharynx is clear.  Eyes:     Conjunctiva/sclera: Conjunctivae normal.     Pupils: Pupils are equal, round, and reactive to light.  Cardiovascular:     Rate and Rhythm: Normal rate and regular rhythm.     Pulses:          Dorsalis pedis pulses are 2+ on the right side and 2+ on the left side.     Heart sounds: No murmur heard.   Pulmonary:     Effort: Pulmonary effort is normal. No respiratory distress.     Breath sounds: Normal breath sounds.  Abdominal:     Palpations: Abdomen is soft. There is no hepatomegaly or mass.     Tenderness: There is no abdominal tenderness.  Lymphadenopathy:     Cervical: No cervical adenopathy.  Skin:    General: Skin is warm.     Findings: No erythema or rash.  Neurological:     Mental Status: She is alert and oriented to person, place, and time.     Cranial Nerves: No cranial nerve deficit.     Motor: Tremor (Hands, mild) present. No weakness or pronator drift.     Gait: Gait normal.     Deep Tendon Reflexes:     Reflex Scores:      Patellar reflexes are 2+ on the right side and 2+ on the left side. Psychiatric:     Comments: Well groomed, good eye contact.    ASSESSMENT AND PLAN:  Gilma was seen today for restless leg and arm.  Diagnoses and all orders for this visit: Orders  Placed This Encounter  Procedures  . CBC  . Basic metabolic panel  . CK  . TSH  . T4, free   Lab Results  Component Value Date   TSH 4.43 12/03/2020   Lab Results  Component Value Date   CREATININE 1.11 12/03/2020   BUN 13 12/03/2020   NA 136 12/03/2020   K 4.4 12/03/2020   CL 106 12/03/2020   CO2 22 12/03/2020   Lab Results  Component Value Date   WBC 10.0  12/03/2020   HGB 13.7 12/03/2020   HCT 41.6 12/03/2020   MCV 86.3 12/03/2020   PLT 259.0 12/03/2020   RLS (restless legs syndrome) Some of hx suggest RLS. We discussed treatment options. She agrees with trying Requip.  -     rOPINIRole (REQUIP) 0.5 MG tablet; Take 1 tablet (0.5 mg total) by mouth at bedtime.  Hypothyroidism, unspecified type Continue Levothyroxine 100 mcg daily. Further recommendations according to TSH result.  Myalgia Chronic. Vibration like sensation also affecting upper extremities sometimes. Hx and examination do not suggest a serious process. Further recommendations according to lab results.  Tremor We discussed possible etiologies.  Evaluated by neuro in 2019 for headaches, she had brain MRI done, which was otherwise normal. ? Essential tremor. We could consider adding a BB, for now she prefers to hold on neuro evaluation. Further recommendations according to lab results.  Return in about 2 months (around 02/02/2021).   Anayia Eugene G. [place], MD  [facility]. [place] office.   A few things to remember from today's visit:   Hypothyroidism, unspecified type - Plan: TSH, T4, free  Myalgia - Plan: CBC, Basic metabolic panel, CK, TSH, T4, free  Tremor  If you need refills please call your pharmacy. Do not use My Chart to request refills or for acute issues that need immediate attention.  Requip started today, you can start with 1/2 tab.  Restless Legs Syndrome Restless legs syndrome is a condition that causes uncomfortable feelings or sensations in the legs,  especially while sitting or lying down. The sensations usually cause an overwhelming urge to move the legs. The arms can also sometimes be affected. The condition can range from mild to severe. The symptoms often interfere with a person's ability to sleep. What are the causes? The cause of this condition is not known. What increases the risk? The following factors may make you more likely to develop this condition:  Being older than 50.  Pregnancy.  Being a woman. In general, the condition is more common in women than in men.  A family history of the condition.  Having iron deficiency.  Overuse of caffeine, nicotine, or alcohol.  Certain medical conditions, such as kidney disease, Parkinson's disease, or nerve damage.  Certain medicines, such as those for high blood pressure, nausea, colds, allergies, depression, and some heart conditions. What are the signs or symptoms? The main symptom of this condition is uncomfortable sensations in the legs, such as:  Pulling.  Tingling.  Prickling.  Throbbing.  Crawling.  Burning. Usually, the sensations:  Affect both sides of the body.  Are worse when you sit or lie down.  Are worse at night. These may wake you up or make it difficult to fall asleep.  Make you have a strong urge to move your legs.  Are temporarily relieved by moving your legs. The arms can also be affected, but this is rare. People who have this condition often have tiredness during the day because of their lack of sleep at night. How is this diagnosed? This condition may be diagnosed based on:  Your symptoms.  Blood tests. In some cases, you may be monitored in a sleep lab by a specialist (a sleep study). This can detect any disruptions in your sleep. How is this treated? This condition is treated by managing the symptoms. This may include:  Lifestyle changes, such as exercising, using relaxation techniques, and avoiding caffeine, alcohol, or  tobacco.  Medicines. Anti-seizure medicines may be tried first. Follow these  instructions at home: General instructions  Take over-the-counter and prescription medicines only as told by your health care provider.  Use methods to help relieve the uncomfortable sensations, such as: ? Massaging your legs. ? Walking or stretching. ? Taking a cold or hot bath.  Keep all follow-up visits as told by your health care provider. This is important. Lifestyle  Practice good sleep habits. For example, go to bed and get up at the same time every day. Most adults should get 7-9 hours of sleep each night.  Exercise regularly. Try to get at least 30 minutes of exercise most days of the week.  Practice ways of relaxing, such as yoga or meditation.  Avoid caffeine and alcohol.  Do not use any products that contain nicotine or tobacco, such as cigarettes and e-cigarettes. If you need help quitting, ask your health care provider.      Contact a health care provider if:  Your symptoms get worse or they do not improve with treatment. Summary  Restless legs syndrome is a condition that causes uncomfortable feelings or sensations in the legs, especially while sitting or lying down.  The symptoms often interfere with a person's ability to sleep.  This condition is treated by managing the symptoms. You may need to make lifestyle changes or take medicines. This information is not intended to replace advice given to you by your health care provider. Make sure you discuss any questions you have with your health care provider. Document Revised: 09/08/2019 Document Reviewed: 08/09/2017 Elsevier Patient Education  2021 Elsevier Inc.   Please be sure medication list is accurate. If a new problem present, please set up appointment sooner than planned today.

## 2020-12-03 NOTE — Patient Instructions (Addendum)
A few things to remember from today's visit:   Hypothyroidism, unspecified type - Plan: TSH, T4, free  Myalgia - Plan: CBC, Basic metabolic panel, CK, TSH, T4, free  Tremor  If you need refills please call your pharmacy. Do not use My Chart to request refills or for acute issues that need immediate attention.  Requip started today, you can start with 1/2 tab.  Restless Legs Syndrome Restless legs syndrome is a condition that causes uncomfortable feelings or sensations in the legs, especially while sitting or lying down. The sensations usually cause an overwhelming urge to move the legs. The arms can also sometimes be affected. The condition can range from mild to severe. The symptoms often interfere with a person's ability to sleep. What are the causes? The cause of this condition is not known. What increases the risk? The following factors may make you more likely to develop this condition:  Being older than 50.  Pregnancy.  Being a woman. In general, the condition is more common in women than in men.  A family history of the condition.  Having iron deficiency.  Overuse of caffeine, nicotine, or alcohol.  Certain medical conditions, such as kidney disease, Parkinson's disease, or nerve damage.  Certain medicines, such as those for high blood pressure, nausea, colds, allergies, depression, and some heart conditions. What are the signs or symptoms? The main symptom of this condition is uncomfortable sensations in the legs, such as:  Pulling.  Tingling.  Prickling.  Throbbing.  Crawling.  Burning. Usually, the sensations:  Affect both sides of the body.  Are worse when you sit or lie down.  Are worse at night. These may wake you up or make it difficult to fall asleep.  Make you have a strong urge to move your legs.  Are temporarily relieved by moving your legs. The arms can also be affected, but this is rare. People who have this condition often have  tiredness during the day because of their lack of sleep at night. How is this diagnosed? This condition may be diagnosed based on:  Your symptoms.  Blood tests. In some cases, you may be monitored in a sleep lab by a specialist (a sleep study). This can detect any disruptions in your sleep. How is this treated? This condition is treated by managing the symptoms. This may include:  Lifestyle changes, such as exercising, using relaxation techniques, and avoiding caffeine, alcohol, or tobacco.  Medicines. Anti-seizure medicines may be tried first. Follow these instructions at home: General instructions  Take over-the-counter and prescription medicines only as told by your health care provider.  Use methods to help relieve the uncomfortable sensations, such as: ? Massaging your legs. ? Walking or stretching. ? Taking a cold or hot bath.  Keep all follow-up visits as told by your health care provider. This is important. Lifestyle  Practice good sleep habits. For example, go to bed and get up at the same time every day. Most adults should get 7-9 hours of sleep each night.  Exercise regularly. Try to get at least 30 minutes of exercise most days of the week.  Practice ways of relaxing, such as yoga or meditation.  Avoid caffeine and alcohol.  Do not use any products that contain nicotine or tobacco, such as cigarettes and e-cigarettes. If you need help quitting, ask your health care provider.      Contact a health care provider if:  Your symptoms get worse or they do not improve with treatment. Summary  Restless legs syndrome is a condition that causes uncomfortable feelings or sensations in the legs, especially while sitting or lying down.  The symptoms often interfere with a person's ability to sleep.  This condition is treated by managing the symptoms. You may need to make lifestyle changes or take medicines. This information is not intended to replace advice given to you  by your health care provider. Make sure you discuss any questions you have with your health care provider. Document Revised: 09/08/2019 Document Reviewed: 08/09/2017 Elsevier Patient Education  2021 Elsevier Inc.   Please be sure medication list is accurate. If a new problem present, please set up appointment sooner than planned today.

## 2021-01-06 ENCOUNTER — Other Ambulatory Visit: Payer: Self-pay | Admitting: Family Medicine

## 2021-01-06 DIAGNOSIS — E039 Hypothyroidism, unspecified: Secondary | ICD-10-CM

## 2021-01-30 ENCOUNTER — Other Ambulatory Visit: Payer: Self-pay | Admitting: Family Medicine

## 2021-01-30 DIAGNOSIS — G2581 Restless legs syndrome: Secondary | ICD-10-CM

## 2021-02-17 ENCOUNTER — Ambulatory Visit: Payer: BC Managed Care – PPO | Admitting: Family Medicine

## 2021-02-17 ENCOUNTER — Encounter: Payer: Self-pay | Admitting: Family Medicine

## 2021-02-17 ENCOUNTER — Other Ambulatory Visit: Payer: Self-pay

## 2021-02-17 VITALS — BP 128/80 | HR 97 | Resp 16 | Ht 66.0 in | Wt 240.1 lb

## 2021-02-17 DIAGNOSIS — F3170 Bipolar disorder, currently in remission, most recent episode unspecified: Secondary | ICD-10-CM

## 2021-02-17 DIAGNOSIS — G2581 Restless legs syndrome: Secondary | ICD-10-CM

## 2021-02-17 DIAGNOSIS — R251 Tremor, unspecified: Secondary | ICD-10-CM

## 2021-02-17 DIAGNOSIS — G4700 Insomnia, unspecified: Secondary | ICD-10-CM | POA: Diagnosis not present

## 2021-02-17 MED ORDER — ROPINIROLE HCL 1 MG PO TABS: 1.0000 mg | ORAL_TABLET | Freq: Every day | ORAL | 1 refills | Status: DC

## 2021-02-17 MED ORDER — TRAZODONE HCL 50 MG PO TABS: 50.0000 mg | ORAL_TABLET | Freq: Every evening | ORAL | 3 refills | Status: DC | PRN

## 2021-02-17 NOTE — Patient Instructions (Addendum)
A few things to remember from today's visit:   RLS (restless legs syndrome) - Plan: rOPINIRole (REQUIP) 1 MG tablet  Tremor  Insomnia, unspecified type  If you need refills please call your pharmacy. Do not use My Chart to request refills or for acute issues that need immediate attention.   Please let me know in 6 weeks if higher dose of Requip help and if Trazodone improves sleep.  Please be sure medication list is accurate. If a new problem present, please set up appointment sooner than planned today.

## 2021-02-17 NOTE — Progress Notes (Signed)
HPI: [name] is a 40 y.o. female, who is here today for 2 months follow up.   She was last seen on 12/03/2020, when Requip 0.5 mg at bedtime was started to help with RLS. RLS like symptoms, need to move LE's constantly, has been going on for about 2 years.  She noted great improvement for the first couple weeks after starting Requip, it does not seem to be helping much at this time. Leg movement interferes with sleep. She has not noticed side effects.  Insomnia: Sleeping about 2-2.5 hours at the time, it takes her 1-2 hours to go back to sleep. + Fatigue. Her psychiatry recommended Ambien, he may release did not help, so changed to Ambien CR 6.25 mg did not help at all, so she discontinued. In the past she took Tylenol, she is not sure if it helped.  Bipolar disorder and depression: She is following with psychiatrist, next follow-up appointment in about a month. Currently she is on Trintellix 10 mg 1/2 tablet daily and lithium 300 mg a.m. and 600 mg p.m. She discontinued hydroxyzine 25 mg.  Tremor: Problem has been going on for years and stable since her last visit. More noticeable withy certain activities like writing and drinking. Problem is not interfering with ADLs. Brain MRI on 04/13/2018 was negative for intracranial abnormality.  Lab Results  Component Value Date   TSH 4.43 12/03/2020   Review of Systems  Constitutional:  Negative for activity change, appetite change and fever.  HENT:  Negative for mouth sores, nosebleeds and sore throat.   Eyes:  Negative for redness and visual disturbance.  Respiratory:  Negative for cough, shortness of breath and wheezing.   Cardiovascular:  Negative for chest pain, palpitations and leg swelling.  Gastrointestinal:  Negative for abdominal pain, nausea and vomiting.       Negative for changes in bowel habits.  Genitourinary:  Negative for decreased urine volume and hematuria.  Neurological:  Negative for syncope,  weakness and headaches.  Psychiatric/Behavioral:  Positive for sleep disturbance. Negative for confusion.   Rest of ROS, see pertinent positives sand negatives in HPI  Current Outpatient Medications on File Prior to Visit  Medication Sig Dispense Refill   hydrOXYzine (VISTARIL) 25 MG capsule Take 25-75 mg by mouth at bedtime as needed.     levothyroxine (SYNTHROID) 100 MCG tablet TAKE 1 TABLET BY MOUTH EVERY DAY BEFORE BREAKFAST 90 tablet 3   lithium carbonate 300 MG capsule 300 mg. 300 mg in am / 600 mg in pm     OVER THE COUNTER MEDICATION Take 1 tablet by mouth daily as needed. Sleep aid     pantoprazole (PROTONIX) 40 MG tablet Take 1 tablet (40 mg total) by mouth daily. 30 tablet 3   TRINTELLIX 10 MG TABS tablet Take 5 mg by mouth daily.     zolpidem (AMBIEN CR) 6.25 MG CR tablet Take 6.25 mg by mouth at bedtime as needed.     No current facility-administered medications on file prior to visit.   Past Medical History:  Diagnosis Date   Anemia    Anxiety    postpartum   Depression    post partum   Gestational diabetes    diet controlled   Hypercalcemia    Hyperlipidemia    Hypertension    not on high blood pressure meds anymore   Hypothyroidism    PE (pulmonary embolism) 2004   Postpartum care following vaginal delivery (11/15) 06/18/2014   Thyroid disease  hypothyroidism   Vitamin D deficiency    Allergies  Allergen Reactions   Ramipril Cough   Social History   Socioeconomic History   Marital status: Married    Spouse name: Barbara Cower   Number of children: 2   Years of education: 18   Highest education level: Master's degree (e.g., MA, MS, MEng, MEd, MSW, MBA)  Occupational History   Occupation: Careers adviser: [employer]  Tobacco Use   Smoking status: Former    Types: Cigarettes    Quit date: 02/25/2008    Years since quitting: 12.9   Smokeless tobacco: Never  Vaping Use   Vaping Use: Never used  Substance and Sexual Activity   Alcohol use: Yes     Comment: social   Drug use: No   Sexual activity: Yes    Birth control/protection: None, Condom  Other Topics Concern   Not on file  Social History Narrative   Patient is right-handed. She lives with her husband and two children in a one level home. She drinks 3 cups of coffee and 4 cans of soda a day. She walks her dog daily.   Social Determinants of Health   Financial Resource Strain: Not on file  Food Insecurity: Not on file  Transportation Needs: Not on file  Physical Activity: Not on file  Stress: Not on file  Social Connections: Not on file   Vitals:   02/17/21 1154  BP: 128/80  Pulse: 97  Resp: 16  SpO2: 97%   Body mass index is 38.76 kg/m.  Physical Exam Vitals and nursing note reviewed.  Constitutional:      General: She is not in acute distress.    Appearance: She is well-developed.  HENT:     Head: Normocephalic and atraumatic.  Eyes:     Conjunctiva/sclera: Conjunctivae normal.  Cardiovascular:     Rate and Rhythm: Normal rate and regular rhythm.     Heart sounds: No murmur heard. Pulmonary:     Effort: Pulmonary effort is normal. No respiratory distress.     Breath sounds: Normal breath sounds.  Abdominal:     Palpations: Abdomen is soft. There is no hepatomegaly or mass.     Tenderness: There is no abdominal tenderness.  Skin:    General: Skin is warm.     Findings: No erythema or rash.  Neurological:     General: No focal deficit present.     Mental Status: She is alert and oriented to person, place, and time.     Cranial Nerves: No cranial nerve deficit.     Motor: Tremor (Hands,bilateral.Not present at rest.) present. No pronator drift.     Gait: Gait normal.  Psychiatric:     Comments: Well groomed, good eye contact.   ASSESSMENT AND PLAN:  [name] was seen today for 2 months follow-up.  Diagnoses and all orders for this visit:  Insomnia, unspecified type Problem is not well controlled. Ambien did not help. We  discussed possible etiologies, it can be related to some of her psychiatric disorders. She agrees with trying trazodone 50 mg at bedtime. We discussed some side effects, including the risk of interaction with some of her other medications (Trintellix and lithium).  Instructed to let her psychiatrist know about the new medication. Good sleep hygiene to continue.  -     traZODone (DESYREL) 50 MG tablet; Take 1 tablet (50 mg total) by mouth at bedtime as needed for sleep.  RLS (restless legs  syndrome) Improved but not well controlled. She will need to increase dose of Requip from 0.5 mg to 1 mg at bedtime. If not major improvement, we can consider adding gabapentin. I asked her to let me know in about 6 weeks if medication changes help.  -     rOPINIRole (REQUIP) 1 MG tablet; Take 1 tablet (1 mg total) by mouth at bedtime.  Tremor Chronic. History and examination suggest essential tremor.  We discussed natural history and treatment options. For now she has not tested in pharmacologic treatment.  Bipolar disorder in partial remission, most recent episode unspecified type [facility]) Continue following with psychiatrist.  Return in about 4 months (around 06/20/2021).  Jennifer Gabor G. [place], MD  [facility]. [place] office.

## 2021-03-12 ENCOUNTER — Other Ambulatory Visit: Payer: Self-pay | Admitting: Family Medicine

## 2021-03-12 DIAGNOSIS — G2581 Restless legs syndrome: Secondary | ICD-10-CM

## 2021-04-10 ENCOUNTER — Other Ambulatory Visit: Payer: Self-pay | Admitting: Family Medicine

## 2021-04-10 DIAGNOSIS — G2581 Restless legs syndrome: Secondary | ICD-10-CM

## 2021-05-06 ENCOUNTER — Other Ambulatory Visit: Payer: Self-pay | Admitting: Family Medicine

## 2021-05-06 DIAGNOSIS — G2581 Restless legs syndrome: Secondary | ICD-10-CM

## 2021-05-08 ENCOUNTER — Other Ambulatory Visit: Payer: Self-pay | Admitting: Family Medicine

## 2021-05-08 DIAGNOSIS — G2581 Restless legs syndrome: Secondary | ICD-10-CM

## 2021-06-23 NOTE — Progress Notes (Signed)
HPI: Jennifer Mcmahon is a 40 y.o. female, who is here today for her routine physical.  Last CPE: 07/22/18  Regular exercise 3 or more time per week: Active around the house, she does not have an exercise routine. She walks her dog, not for long. Following a healthy diet: Trying to eat less meat, once per week.A lot of vegetables and beans, she also eats hamburger once per week.  She does not like chicken, she likes fish. She lives with her husband and 2 children.  Chronic medical problems: Tremor, bipolar disorder,HLD,hypothyroidism, insomnia,and migraine. She follows with neuro and psychiatrist.  Immunization History  Administered Date(s) Administered   Influenza Inj Mdck Quad Pf 04/27/2018   Influenza,inj,Quad PF,6+ Mos 05/08/2013, 04/15/2015, 04/30/2016, 07/19/2017, 05/08/2019   Influenza-Unspecified 04/27/2018   Tdap 11/05/2002, 05/08/2013   Health Maintenance  Topic Date Due   PAP SMEAR-Modifier  05/08/2016   COVID-19 Vaccine (1) 07/10/2021 (Originally 08/10/1981)   INFLUENZA VACCINE  10/31/2021 (Originally 03/03/2021)   TETANUS/TDAP  05/09/2023   Hepatitis C Screening  Completed   HIV Screening  Completed   HPV VACCINES  Aged Out   Pneumococcal Vaccine 107-13 Years old  Discontinued   She follows with gyn, planning on arranging appt. Mammogram in 10/2018, planning on arranging one. She is not on birth controlled. LMP 1.5 weeks ago.  HLD: Currently she is on nonpharmacologic treatment. Lab Results  Component Value Date   CHOL 223 (H) 08/08/2018   HDL 53.10 08/08/2018   LDLCALC 136 (H) 08/08/2018   TRIG 172.0 (H) 08/08/2018   CHOLHDL 4 08/08/2018   Concerned about thyroid disease, several family members hav had thyroidectomy, goier. Hypothyroidism: Currently she is on levothyroxine 100 mcg daily. Negative for dysphagia or neck masses.  Lab Results  Component Value Date   TSH 4.43 12/03/2020   RLS: She is requesting  increasing dose of Requip, sometimes  she take 2 tabs and sleep better. Problem interferes with his sleep. Requip is also helping with tremor. Negative for lower extremity edema, erythema, or pain.  Review of Systems  Constitutional:  Negative for appetite change and fever.  HENT:  Negative for hearing loss, mouth sores, sore throat and voice change.   Eyes:  Negative for redness and visual disturbance.  Respiratory:  Negative for cough, shortness of breath and wheezing.   Cardiovascular:  Negative for chest pain and leg swelling.  Gastrointestinal:  Negative for abdominal pain, nausea and vomiting.       No changes in bowel habits.  Endocrine: Negative for cold intolerance, heat intolerance, polydipsia and polyuria.  Genitourinary:  Negative for decreased urine volume, dysuria, hematuria, vaginal bleeding and vaginal discharge.  Musculoskeletal:  Negative for arthralgias, back pain and neck pain.  Skin:  Negative for color change and rash.  Allergic/Immunologic: Negative for environmental allergies.  Neurological:  Positive for tremors and headaches (Hx of migraines.). Negative for syncope and weakness.  Hematological:  Negative for adenopathy. Does not bruise/bleed easily.  Psychiatric/Behavioral:  Positive for sleep disturbance. Negative for confusion. The patient is nervous/anxious.   All other systems reviewed and are negative.  Current Outpatient Medications on File Prior to Visit  Medication Sig Dispense Refill   levothyroxine (SYNTHROID) 100 MCG tablet TAKE 1 TABLET BY MOUTH EVERY DAY BEFORE BREAKFAST 90 tablet 3   lithium carbonate 300 MG capsule 300 mg. 300 mg in am / 600 mg in pm     OVER THE COUNTER MEDICATION Take 1 tablet by mouth daily as needed.  Sleep aid     traZODone (DESYREL) 50 MG tablet Take 1 tablet (50 mg total) by mouth at bedtime as needed for sleep. 30 tablet 3   hydrOXYzine (VISTARIL) 25 MG capsule Take 25-75 mg by mouth at bedtime as needed. (Patient not taking: Reported on 06/24/2021)      pantoprazole (PROTONIX) 40 MG tablet Take 1 tablet (40 mg total) by mouth daily. (Patient not taking: Reported on 06/24/2021) 30 tablet 3   TRINTELLIX 10 MG TABS tablet Take 5 mg by mouth daily. (Patient not taking: Reported on 06/24/2021)     zolpidem (AMBIEN CR) 6.25 MG CR tablet Take 6.25 mg by mouth at bedtime as needed. (Patient not taking: Reported on 06/24/2021)     No current facility-administered medications on file prior to visit.   Past Medical History:  Diagnosis Date   Anemia    Anxiety    postpartum   Depression    post partum   Gestational diabetes    diet controlled   Hypercalcemia    Hyperlipidemia    Hypertension    not on high blood pressure meds anymore   Hypothyroidism    PE (pulmonary embolism) 2004   Postpartum care following vaginal delivery (11/15) 06/18/2014   Thyroid disease    hypothyroidism   Vitamin D deficiency    Past Surgical History:  Procedure Laterality Date   CHOLECYSTECTOMY N/A 06/12/2016   Procedure: LAPAROSCOPIC CHOLECYSTECTOMY;  Surgeon: Ralene Ok, MD;  Location: [facility];  Service: General;  Laterality: N/A;   EYE SURGERY     lasik   WISDOM TOOTH EXTRACTION     Allergies  Allergen Reactions   Ramipril Cough    Family History  Problem Relation Age of Onset   Cancer Mother        uterine and ovarian   Hypertension Mother    Varicose Veins Mother    Thyroid disease Mother    Hyperlipidemia Mother    Mental illness Mother    Tuberculosis Sister    Heart disease Maternal Grandmother    Varicose Veins Maternal Grandfather    Mental illness Maternal Grandfather    Bipolar disorder Paternal Grandfather    Cancer Paternal Grandmother     Social History   Socioeconomic History   Marital status: Married    Spouse name: Corene Cornea   Number of children: 2   Years of education: 23   Highest education level: Master's degree (e.g., MA, MS, MEng, MEd, MSW, MBA)  Occupational History   Occupation: Environmental education officer: [employer]  Tobacco Use   Smoking status: Former    Types: Cigarettes    Quit date: 02/25/2008    Years since quitting: 13.3   Smokeless tobacco: Never  Vaping Use   Vaping Use: Never used  Substance and Sexual Activity   Alcohol use: Yes    Comment: social   Drug use: No   Sexual activity: Yes    Birth control/protection: None, Condom  Other Topics Concern   Not on file  Social History Narrative   Patient is right-handed. She lives with her husband and two children in a one level home. She drinks 3 cups of coffee and 4 cans of soda a day. She walks her dog daily.   Social Determinants of Health   Financial Resource Strain: Not on file  Food Insecurity: Not on file  Transportation Needs: Not on file  Physical Activity: Not on file  Stress: Not on file  Social Connections: Not  on file   Vitals:   06/24/21 0837  BP: 128/84  Pulse: 90  Resp: 16  Temp: 98.2 F (36.8 C)  SpO2: 98%   Body mass index is 37.41 kg/m.  Wt Readings from Last 3 Encounters:  06/24/21 231 lb 12.8 oz (105.1 kg)  02/17/21 240 lb 2 oz (108.9 kg)  12/03/20 244 lb 3.2 oz (110.8 kg)   Physical Exam Vitals and nursing note reviewed.  Constitutional:      General: She is not in acute distress.    Appearance: She is well-developed.  HENT:     Head: Normocephalic and atraumatic.     Right Ear: Hearing, tympanic membrane, ear canal and external ear normal.     Left Ear: Hearing, tympanic membrane, ear canal and external ear normal.     Mouth/Throat:     Mouth: Mucous membranes are moist.     Pharynx: Oropharynx is clear. Uvula midline.  Eyes:     Extraocular Movements: Extraocular movements intact.     Conjunctiva/sclera: Conjunctivae normal.     Pupils: Pupils are equal, round, and reactive to light.  Neck:     Thyroid: Thyromegaly (? Right nodule.) present.     Trachea: No tracheal deviation.  Cardiovascular:     Rate and Rhythm: Normal rate and regular rhythm.     Pulses:           Dorsalis pedis pulses are 2+ on the right side and 2+ on the left side.     Heart sounds: No murmur heard. Pulmonary:     Effort: Pulmonary effort is normal. No respiratory distress.     Breath sounds: Normal breath sounds.  Abdominal:     Palpations: Abdomen is soft. There is no hepatomegaly or mass.     Tenderness: There is no abdominal tenderness.  Genitourinary:    Comments: Deferred to gyn. Musculoskeletal:     Comments: No major deformity or signs of synovitis appreciated.  Lymphadenopathy:     Cervical: No cervical adenopathy.     Upper Body:     Right upper body: No supraclavicular adenopathy.     Left upper body: No supraclavicular adenopathy.  Skin:    General: Skin is warm.     Findings: No erythema or rash.  Neurological:     General: No focal deficit present.     Mental Status: She is alert and oriented to person, place, and time.     Cranial Nerves: No cranial nerve deficit.     Coordination: Coordination normal.     Gait: Gait normal.     Deep Tendon Reflexes:     Reflex Scores:      Bicep reflexes are 2+ on the right side and 2+ on the left side.      Patellar reflexes are 2+ on the right side and 2+ on the left side. Psychiatric:        Speech: Speech normal.     Comments: Well groomed, good eye contact.   ASSESSMENT AND PLAN:  Ms. Jaydyn Kaeo was here today annual physical examination.  Orders Placed This Encounter  Procedures   US THYROID   Comprehensive metabolic panel   TSH   Lipid panel   Hemoglobin A1c   Lithium level   Hep C Antibody   Ambulatory referral to Neurology   Lab Results  Component Value Date   HGBA1C 5.2 06/24/2021   Lab Results  Component Value Date   CREATININE 1.13 06/24/2021   BUN 13  06/24/2021   NA 137 06/24/2021   K 4.2 06/24/2021   CL 104 06/24/2021   CO2 27 06/24/2021   Lab Results  Component Value Date   CHOL 230 (H) 06/24/2021   HDL 58.70 06/24/2021   LDLCALC 137 (H) 06/24/2021   TRIG 172.0 (H)  06/24/2021   CHOLHDL 4 06/24/2021   Lab Results  Component Value Date   ALT 14 06/24/2021   AST 15 06/24/2021   ALKPHOS 61 06/24/2021   BILITOT 0.4 06/24/2021   Lab Results  Component Value Date   TSH 4.08 06/24/2021   Routine general medical examination at a health care facility We discussed the importance of regular physical activity and healthy diet for prevention of chronic illness and/or complications. Preventive guidelines reviewed. Female preventive care to continue with gyn. Vaccination up to date. Next CPE in a year.  The 10-year ASCVD risk score (Arnett DK, et al., 2019) is: 0.8%   Values used to calculate the score:     Age: 26 years     Sex: Female     Is Non-Hispanic African American: No     Diabetic: No     Tobacco smoker: No     Systolic Blood Pressure: 0000000 mmHg     Is BP treated: No     HDL Cholesterol: 58.7 mg/dL     Total Cholesterol: 230 mg/dL  Encounter for HCV screening test for low risk patient -     Hep C Antibody  Screening for endocrine, metabolic and immunity disorder -     Hemoglobin A1c -     Comprehensive metabolic panel  Medication monitoring encounter -     Lithium level  Enlarged thyroid gland ? Of right thyroid nodule. Thyroid US order placed.  Hypothyroidism Continue Levothyroxine 100 mcg daily. Treatment will be adjusted, if needed, according to TSH result.  Hyperlipidemia Non pharmacologic treatment recommended for now. Further recommendations will be given according to 10 years CVD risk score and lipid panel numbers.   Headache, unspecified headache type Problem has improved. Continue following with neurologist.  Tremor Stable. Possible causes discussed. Continue following with neurologist.  RLS (restless legs syndrome) Requip dose increased from 1 mg to 2 mg at bedtime. If problem is not greatly improved, recommend following with neurologist.  Return in 6 months (on 12/22/2021).  Feather Berrie G. [place], MD  [facility]. [place] office.

## 2021-06-24 ENCOUNTER — Encounter: Payer: Self-pay | Admitting: Family Medicine

## 2021-06-24 ENCOUNTER — Ambulatory Visit (INDEPENDENT_AMBULATORY_CARE_PROVIDER_SITE_OTHER): Payer: 59 | Admitting: Family Medicine

## 2021-06-24 VITALS — BP 128/84 | HR 90 | Temp 98.2°F | Resp 16 | Ht 66.0 in | Wt 231.8 lb

## 2021-06-24 DIAGNOSIS — Z1159 Encounter for screening for other viral diseases: Secondary | ICD-10-CM

## 2021-06-24 DIAGNOSIS — Z5181 Encounter for therapeutic drug level monitoring: Secondary | ICD-10-CM

## 2021-06-24 DIAGNOSIS — Z1329 Encounter for screening for other suspected endocrine disorder: Secondary | ICD-10-CM | POA: Diagnosis not present

## 2021-06-24 DIAGNOSIS — E785 Hyperlipidemia, unspecified: Secondary | ICD-10-CM | POA: Diagnosis not present

## 2021-06-24 DIAGNOSIS — Z0000 Encounter for general adult medical examination without abnormal findings: Secondary | ICD-10-CM

## 2021-06-24 DIAGNOSIS — Z130 Encounter for screening for diseases of the blood and blood-forming organs and certain disorders involving the immune mechanism: Secondary | ICD-10-CM | POA: Diagnosis not present

## 2021-06-24 DIAGNOSIS — E039 Hypothyroidism, unspecified: Secondary | ICD-10-CM | POA: Diagnosis not present

## 2021-06-24 DIAGNOSIS — E049 Nontoxic goiter, unspecified: Secondary | ICD-10-CM | POA: Diagnosis not present

## 2021-06-24 DIAGNOSIS — G2581 Restless legs syndrome: Secondary | ICD-10-CM

## 2021-06-24 DIAGNOSIS — Z13228 Encounter for screening for other metabolic disorders: Secondary | ICD-10-CM

## 2021-06-24 DIAGNOSIS — R519 Headache, unspecified: Secondary | ICD-10-CM

## 2021-06-24 DIAGNOSIS — R251 Tremor, unspecified: Secondary | ICD-10-CM

## 2021-06-24 LAB — HEMOGLOBIN A1C: Hgb A1c MFr Bld: 5.2 % (ref 4.6–6.5)

## 2021-06-24 LAB — LIPID PANEL
Cholesterol: 230 mg/dL — ABNORMAL HIGH (ref 0–200)
HDL: 58.7 mg/dL (ref >39.00)
LDL Cholesterol: 137 mg/dL — ABNORMAL HIGH (ref 0–99)
NonHDL: 171.67
Total CHOL/HDL Ratio: 4
Triglycerides: 172 mg/dL — ABNORMAL HIGH (ref 0.0–149.0)
VLDL: 34.4 mg/dL (ref 0.0–40.0)

## 2021-06-24 LAB — COMPREHENSIVE METABOLIC PANEL
ALT: 14 U/L (ref 0–35)
AST: 15 U/L (ref 0–37)
Albumin: 4.6 g/dL (ref 3.5–5.2)
Alkaline Phosphatase: 61 U/L (ref 39–117)
BUN: 13 mg/dL (ref 6–23)
CO2: 27 mEq/L (ref 19–32)
Calcium: 10.6 mg/dL — ABNORMAL HIGH (ref 8.4–10.5)
Chloride: 104 mEq/L (ref 96–112)
Creatinine, Ser: 1.13 mg/dL (ref 0.40–1.20)
GFR: 60.06 mL/min (ref >60.00)
Glucose, Bld: 86 mg/dL (ref 70–99)
Potassium: 4.2 mEq/L (ref 3.5–5.1)
Sodium: 137 mEq/L (ref 135–145)
Total Bilirubin: 0.4 mg/dL (ref 0.2–1.2)
Total Protein: 8.1 g/dL (ref 6.0–8.3)

## 2021-06-24 LAB — TSH: TSH: 4.08 u[IU]/mL (ref 0.35–5.50)

## 2021-06-24 MED ORDER — ROPINIROLE HCL 2 MG PO TABS: 2.0000 mg | ORAL_TABLET | Freq: Every day | ORAL | 1 refills | Status: DC

## 2021-06-24 NOTE — Assessment & Plan Note (Signed)
Non pharmacologic treatment recommended for now. Further recommendations will be given according to 10 years CVD risk score and lipid panel numbers. 

## 2021-06-24 NOTE — Assessment & Plan Note (Signed)
Problem has improved. Continue following with neurologist.

## 2021-06-24 NOTE — Assessment & Plan Note (Signed)
Stable. Possible causes discussed. Continue following with neurologist.

## 2021-06-24 NOTE — Patient Instructions (Addendum)
A few things to remember from today's visit:  Routine general medical examination at a health care facility  Hyperlipidemia, unspecified hyperlipidemia type - Plan: Lipid panel  Hypothyroidism, unspecified type  Encounter for HCV screening test for low risk patient  Screening for endocrine, metabolic and immunity disorder - Plan: Comprehensive metabolic panel, Hemoglobin A1c  Medication monitoring encounter - Plan: Lithium level  RLS (restless legs syndrome) - Plan: rOPINIRole (REQUIP) 2 MG tablet  Enlarged thyroid gland - Plan: TSH, US THYROID  Tremor - Plan: Ambulatory referral to Neurology  Headache, unspecified headache type - Plan: Ambulatory referral to Neurology  If you need refills please call your pharmacy. Do not use My Chart to request refills or for acute issues that need immediate attention.   Requip increased to 2 mg at bedtime.  Please be sure medication list is accurate. If a new problem present, please set up appointment sooner than planned today.  Health Maintenance, Female Adopting a healthy lifestyle and getting preventive care are important in promoting health and wellness. Ask your health care provider about: The right schedule for you to have regular tests and exams. Things you can do on your own to prevent diseases and keep yourself healthy. What should I know about diet, weight, and exercise? Eat a healthy diet  Eat a diet that includes plenty of vegetables, fruits, low-fat dairy products, and lean protein. Do not eat a lot of foods that are high in solid fats, added sugars, or sodium. Maintain a healthy weight Body mass index (BMI) is used to identify weight problems. It estimates body fat based on height and weight. Your health care provider can help determine your BMI and help you achieve or maintain a healthy weight. Get regular exercise Get regular exercise. This is one of the most important things you can do for your health. Most adults  should: Exercise for at least 150 minutes each week. The exercise should increase your heart rate and make you sweat (moderate-intensity exercise). Do strengthening exercises at least twice a week. This is in addition to the moderate-intensity exercise. Spend less time sitting. Even light physical activity can be beneficial. Watch cholesterol and blood lipids Have your blood tested for lipids and cholesterol at 42 years of age, then have this test every 5 years. Have your cholesterol levels checked more often if: Your lipid or cholesterol levels are high. You are older than 42 years of age. You are at high risk for heart disease. What should I know about cancer screening? Depending on your health history and family history, you may need to have cancer screening at various ages. This may include screening for: Breast cancer. Cervical cancer. Colorectal cancer. Skin cancer. Lung cancer. What should I know about heart disease, diabetes, and high blood pressure? Blood pressure and heart disease High blood pressure causes heart disease and increases the risk of stroke. This is more likely to develop in people who have high blood pressure readings or are overweight. Have your blood pressure checked: Every 3-5 years if you are 49-76 years of age. Every year if you are 53 years old or older. Diabetes Have regular diabetes screenings. This checks your fasting blood sugar level. Have the screening done: Once every three years after age 48 if you are at a normal weight and have a low risk for diabetes. More often and at a younger age if you are overweight or have a high risk for diabetes. What should I know about preventing infection? Hepatitis B If  you have a higher risk for hepatitis B, you should be screened for this virus. Talk with your health care provider to find out if you are at risk for hepatitis B infection. Hepatitis C Testing is recommended for: Everyone born from [birth year] through  1965. Anyone with known risk factors for hepatitis C. Sexually transmitted infections (STIs) Get screened for STIs, including gonorrhea and chlamydia, if: You are sexually active and are younger than 42 years of age. You are older than 42 years of age and your health care provider tells you that you are at risk for this type of infection. Your sexual activity has changed since you were last screened, and you are at increased risk for chlamydia or gonorrhea. Ask your health care provider if you are at risk. Ask your health care provider about whether you are at high risk for HIV. Your health care provider may recommend a prescription medicine to help prevent HIV infection. If you choose to take medicine to prevent HIV, you should first get tested for HIV. You should then be tested every 3 months for as long as you are taking the medicine. Pregnancy If you are about to stop having your period (premenopausal) and you may become pregnant, seek counseling before you get pregnant. Take 400 to 800 micrograms (mcg) of folic acid every day if you become pregnant. Ask for birth control (contraception) if you want to prevent pregnancy. Osteoporosis and menopause Osteoporosis is a disease in which the bones lose minerals and strength with aging. This can result in bone fractures. If you are 27 years old or older, or if you are at risk for osteoporosis and fractures, ask your health care provider if you should: Be screened for bone loss. Take a calcium or vitamin D supplement to lower your risk of fractures. Be given hormone replacement therapy (HRT) to treat symptoms of menopause. Follow these instructions at home: Alcohol use Do not drink alcohol if: Your health care provider tells you not to drink. You are pregnant, may be pregnant, or are planning to become pregnant. If you drink alcohol: Limit how much you have to: 0-1 drink a day. Know how much alcohol is in your drink. In the [place] one drink  equals one 12 oz bottle of beer (355 mL), one 5 oz glass of wine (148 mL), or one 1 oz glass of hard liquor (44 mL). Lifestyle Do not use any products that contain nicotine or tobacco. These products include cigarettes, chewing tobacco, and vaping devices, such as e-cigarettes. If you need help quitting, ask your health care provider. Do not use street drugs. Do not share needles. Ask your health care provider for help if you need support or information about quitting drugs. General instructions Schedule regular health, dental, and eye exams. Stay current with your vaccines. Tell your health care provider if: You often feel depressed. You have ever been abused or do not feel safe at home. Summary Adopting a healthy lifestyle and getting preventive care are important in promoting health and wellness. Follow your health care provider's instructions about healthy diet, exercising, and getting tested or screened for diseases. Follow your health care provider's instructions on monitoring your cholesterol and blood pressure. This information is not intended to replace advice given to you by your health care provider. Make sure you discuss any questions you have with your health care provider. Document Revised: 12/09/2020 Document Reviewed: 12/09/2020 Elsevier Patient Education  2022 [employer].

## 2021-06-24 NOTE — Assessment & Plan Note (Addendum)
Continue Levothyroxine 100 mcg daily. Treatment will be adjusted, if needed, according to TSH result.

## 2021-06-25 ENCOUNTER — Encounter: Payer: Self-pay | Admitting: Family Medicine

## 2021-06-25 LAB — HEPATITIS C ANTIBODY
Hepatitis C Ab: NONREACTIVE
SIGNAL TO CUT-OFF: 0.03 (ref <1.00)

## 2021-06-25 LAB — LITHIUM LEVEL: Lithium Lvl: 1.1 mmol/L (ref 0.6–1.2)

## 2021-07-02 ENCOUNTER — Other Ambulatory Visit: Payer: Self-pay

## 2021-07-02 DIAGNOSIS — G4700 Insomnia, unspecified: Secondary | ICD-10-CM

## 2021-07-02 MED ORDER — TRAZODONE HCL 50 MG PO TABS: 50.0000 mg | ORAL_TABLET | Freq: Every evening | ORAL | 1 refills | Status: DC | PRN

## 2021-07-08 ENCOUNTER — Ambulatory Visit
Admission: RE | Admit: 2021-07-08 | Discharge: 2021-07-08 | Disposition: A | Discharge status: Home / Self Care | Payer: 59 | Source: Ambulatory Visit | Attending: Family Medicine | Admitting: Family Medicine

## 2021-07-08 DIAGNOSIS — E049 Nontoxic goiter, unspecified: Secondary | ICD-10-CM

## 2021-09-01 ENCOUNTER — Other Ambulatory Visit: Payer: Self-pay | Admitting: Family Medicine

## 2021-09-01 DIAGNOSIS — G2581 Restless legs syndrome: Secondary | ICD-10-CM

## 2021-09-01 MED ORDER — ROPINIROLE HCL 2 MG PO TABS: 2.0000 mg | ORAL_TABLET | Freq: Every day | ORAL | 1 refills | Status: DC

## 2021-09-29 ENCOUNTER — Ambulatory Visit: Payer: 59 | Admitting: Family Medicine

## 2021-09-29 ENCOUNTER — Ambulatory Visit (INDEPENDENT_AMBULATORY_CARE_PROVIDER_SITE_OTHER): Payer: 59 | Admitting: Neurology

## 2021-09-29 ENCOUNTER — Encounter: Payer: Self-pay | Admitting: Family Medicine

## 2021-09-29 ENCOUNTER — Encounter: Payer: Self-pay | Admitting: Neurology

## 2021-09-29 ENCOUNTER — Other Ambulatory Visit: Payer: Self-pay

## 2021-09-29 VITALS — BP 148/100 | HR 87 | Resp 16 | Ht 66.0 in | Wt 222.0 lb

## 2021-09-29 VITALS — BP 152/112 | HR 73 | Ht 66.0 in | Wt 222.0 lb

## 2021-09-29 DIAGNOSIS — F3170 Bipolar disorder, currently in remission, most recent episode unspecified: Secondary | ICD-10-CM

## 2021-09-29 DIAGNOSIS — R030 Elevated blood-pressure reading, without diagnosis of hypertension: Secondary | ICD-10-CM | POA: Diagnosis not present

## 2021-09-29 DIAGNOSIS — I10 Essential (primary) hypertension: Secondary | ICD-10-CM

## 2021-09-29 DIAGNOSIS — G251 Drug-induced tremor: Secondary | ICD-10-CM | POA: Diagnosis not present

## 2021-09-29 DIAGNOSIS — Z6835 Body mass index (BMI) 35.0-35.9, adult: Secondary | ICD-10-CM | POA: Diagnosis not present

## 2021-09-29 MED ORDER — AMLODIPINE BESYLATE 5 MG PO TABS: 5.0000 mg | ORAL_TABLET | Freq: Every day | ORAL | 0 refills | Status: DC

## 2021-09-29 NOTE — Patient Instructions (Addendum)
A few things to remember from today's visit:  Essential hypertension  If you need refills please call your pharmacy. Do not use My Chart to request refills or for acute issues that need immediate attention.   Amlodipine started today, you can take it at bedtime.  Please be sure medication list is accurate. If a new problem present, please set up appointment sooner than planned today.  DASH Eating Plan DASH stands for Dietary Approaches to Stop Hypertension. The DASH eating plan is a healthy eating plan that has been shown to: Reduce high blood pressure (hypertension). Reduce your risk for type 2 diabetes, heart disease, and stroke. Help with weight loss. What are tips for following this plan? Reading food labels Check food labels for the amount of salt (sodium) per serving. Choose foods with less than 5 percent of the Daily Value of sodium. Generally, foods with less than 300 milligrams (mg) of sodium per serving fit into this eating plan. To find whole grains, look for the word "whole" as the first word in the ingredient list. Shopping Buy products labeled as "low-sodium" or "no salt added." Buy fresh foods. Avoid canned foods and pre-made or frozen meals. Cooking Avoid adding salt when cooking. Use salt-free seasonings or herbs instead of table salt or sea salt. Check with your health care provider or pharmacist before using salt substitutes. Do not fry foods. Cook foods using healthy methods such as baking, boiling, grilling, roasting, and broiling instead. Cook with heart-healthy oils, such as olive, canola, avocado, soybean, or sunflower oil. Meal planning  Eat a balanced diet that includes: 4 or more servings of fruits and 4 or more servings of vegetables each day. Try to fill one-half of your plate with fruits and vegetables. 6-8 servings of whole grains each day. Less than 6 oz (170 g) of lean meat, poultry, or fish each day. A 3-oz (85-g) serving of meat is about the same  size as a deck of cards. One egg equals 1 oz (28 g). 2-3 servings of low-fat dairy each day. One serving is 1 cup (237 mL). 1 serving of nuts, seeds, or beans 5 times each week. 2-3 servings of heart-healthy fats. Healthy fats called omega-3 fatty acids are found in foods such as walnuts, flaxseeds, fortified milks, and eggs. These fats are also found in cold-water fish, such as sardines, salmon, and mackerel. Limit how much you eat of: Canned or prepackaged foods. Food that is high in trans fat, such as some fried foods. Food that is high in saturated fat, such as fatty meat. Desserts and other sweets, sugary drinks, and other foods with added sugar. Full-fat dairy products. Do not salt foods before eating. Do not eat more than 4 egg yolks a week. Try to eat at least 2 vegetarian meals a week. Eat more home-cooked food and less restaurant, buffet, and fast food. Lifestyle When eating at a restaurant, ask that your food be prepared with less salt or no salt, if possible. If you drink alcohol: Limit how much you use to: 0-1 drink a day for women who are not pregnant. 0-2 drinks a day for men. Be aware of how much alcohol is in your drink. In the [place] one drink equals one 12 oz bottle of beer (355 mL), one 5 oz glass of wine (148 mL), or one 1 oz glass of hard liquor (44 mL). General information Avoid eating more than 2,300 mg of salt a day. If you have hypertension, you may need to reduce  your sodium intake to 1,500 mg a day. Work with your health care provider to maintain a healthy body weight or to lose weight. Ask what an ideal weight is for you. Get at least 30 minutes of exercise that causes your heart to beat faster (aerobic exercise) most days of the week. Activities may include walking, swimming, or biking. Work with your health care provider or dietitian to adjust your eating plan to your individual calorie needs. What foods should I eat? Fruits All fresh, dried, or frozen  fruit. Canned fruit in natural juice (without added sugar). Vegetables Fresh or frozen vegetables (raw, steamed, roasted, or grilled). Low-sodium or reduced-sodium tomato and vegetable juice. Low-sodium or reduced-sodium tomato sauce and tomato paste. Low-sodium or reduced-sodium canned vegetables. Grains Whole-grain or whole-wheat bread. Whole-grain or whole-wheat pasta. Brown rice. Orpah Cobb. Bulgur. Whole-grain and low-sodium cereals. Pita bread. Low-fat, low-sodium crackers. Whole-wheat flour tortillas. Meats and other proteins Skinless chicken or [place]. Ground chicken or [place]. Pork with fat trimmed off. Fish and seafood. Egg whites. Dried beans, peas, or lentils. Unsalted nuts, nut butters, and seeds. Unsalted canned beans. Lean cuts of beef with fat trimmed off. Low-sodium, lean precooked or cured meat, such as sausages or meat loaves. Dairy Low-fat (1%) or fat-free (skim) milk. Reduced-fat, low-fat, or fat-free cheeses. Nonfat, low-sodium ricotta or cottage cheese. Low-fat or nonfat yogurt. Low-fat, low-sodium cheese. Fats and oils Soft margarine without trans fats. Vegetable oil. Reduced-fat, low-fat, or light mayonnaise and salad dressings (reduced-sodium). Canola, safflower, olive, avocado, soybean, and sunflower oils. Avocado. Seasonings and condiments Herbs. Spices. Seasoning mixes without salt. Other foods Unsalted popcorn and pretzels. Fat-free sweets. The items listed above may not be a complete list of foods and beverages you can eat. Contact a dietitian for more information. What foods should I avoid? Fruits Canned fruit in a light or heavy syrup. Fried fruit. Fruit in cream or butter sauce. Vegetables Creamed or fried vegetables. Vegetables in a cheese sauce. Regular canned vegetables (not low-sodium or reduced-sodium). Regular canned tomato sauce and paste (not low-sodium or reduced-sodium). Regular tomato and vegetable juice (not low-sodium or reduced-sodium).  Rosita Fire. Olives. Grains Baked goods made with fat, such as croissants, muffins, or some breads. Dry pasta or rice meal packs. Meats and other proteins Fatty cuts of meat. Ribs. Fried meat. Tomasa Blase. Bologna, salami, and other precooked or cured meats, such as sausages or meat loaves. Fat from the back of a pig (fatback). Bratwurst. Salted nuts and seeds. Canned beans with added salt. Canned or smoked fish. Whole eggs or egg yolks. Chicken or [place] with skin. Dairy Whole or 2% milk, cream, and half-and-half. Whole or full-fat cream cheese. Whole-fat or sweetened yogurt. Full-fat cheese. Nondairy creamers. Whipped toppings. Processed cheese and cheese spreads. Fats and oils Butter. Stick margarine. Lard. Shortening. Ghee. Bacon fat. Tropical oils, such as coconut, palm kernel, or palm oil. Seasonings and condiments Onion salt, garlic salt, seasoned salt, table salt, and sea salt. Worcestershire sauce. Tartar sauce. Barbecue sauce. Teriyaki sauce. Soy sauce, including reduced-sodium. Steak sauce. Canned and packaged gravies. Fish sauce. Oyster sauce. Cocktail sauce. Store-bought horseradish. Ketchup. Mustard. Meat flavorings and tenderizers. Bouillon cubes. Hot sauces. Pre-made or packaged marinades. Pre-made or packaged taco seasonings. Relishes. Regular salad dressings. Other foods Salted popcorn and pretzels. The items listed above may not be a complete list of foods and beverages you should avoid. Contact a dietitian for more information. Where to find more information National Heart, Lung, and Blood Institute: PopSteam.is American Heart Association: www.heart.org Academy  of Nutrition and Dietetics: www.eatright.org National Kidney Foundation: www.kidney.org Summary The DASH eating plan is a healthy eating plan that has been shown to reduce high blood pressure (hypertension). It may also reduce your risk for type 2 diabetes, heart disease, and stroke. When on the DASH eating plan, aim to  eat more fresh fruits and vegetables, whole grains, lean proteins, low-fat dairy, and heart-healthy fats. With the DASH eating plan, you should limit salt (sodium) intake to 2,300 mg a day. If you have hypertension, you may need to reduce your sodium intake to 1,500 mg a day. Work with your health care provider or dietitian to adjust your eating plan to your individual calorie needs. This information is not intended to replace advice given to you by your health care provider. Make sure you discuss any questions you have with your health care provider. Document Revised: 06/23/2019 Document Reviewed: 06/23/2019 Elsevier Patient Education  2022 [employer].

## 2021-09-29 NOTE — Patient Instructions (Signed)
Your tremor are most like a side effect of your lithium.  Please follow-up with your psychiatrist about medication options

## 2021-09-29 NOTE — Progress Notes (Signed)
Follow-up Visit   Date: 09/29/21   Jennifer Mcmahon MRN: [id number] DOB: Oct 09, 1980   Interim History: Jennifer Mcmahon is a 41 y.o. right-handed Caucasian female with anxiety/depression, RLS, migraines, and hypothyroidism returning to the clinic for with new complaints of tremors.  The patient was accompanied to the clinic by self.  She was previously seen in 2020 for left arm numbness and EDX was normal.   Today, she presents with new complaints of tremors.  Starting around 2021, she began having bilateral hand tremors which is intermittent throughout the month, worse around her menstrual cycle.  Sometimes she has more of an internal tremor in her chest.  Tremors can bother her with fine motor tasks and can be present at rest or with activity.  She takes lithium 600mg /d for depression for the past few years.    Medications:  Current Outpatient Medications on File Prior to Visit  Medication Sig Dispense Refill   levothyroxine (SYNTHROID) 100 MCG tablet TAKE 1 TABLET BY MOUTH EVERY DAY BEFORE BREAKFAST 90 tablet 3   lithium carbonate 300 MG capsule 300 mg. 300 mg in am / 600 mg in pm     OVER THE COUNTER MEDICATION Take 1 tablet by mouth daily as needed. Sleep aid     rOPINIRole (REQUIP) 2 MG tablet Take 1 tablet (2 mg total) by mouth at bedtime. 90 tablet 1   traZODone (DESYREL) 50 MG tablet Take 1 tablet (50 mg total) by mouth at bedtime as needed for sleep. 90 tablet 1   hydrOXYzine (VISTARIL) 25 MG capsule Take 25-75 mg by mouth at bedtime as needed. (Patient not taking: Reported on 06/24/2021)     pantoprazole (PROTONIX) 40 MG tablet Take 1 tablet (40 mg total) by mouth daily. (Patient not taking: Reported on 06/24/2021) 30 tablet 3   zolpidem (AMBIEN CR) 6.25 MG CR tablet Take 6.25 mg by mouth at bedtime as needed. (Patient not taking: Reported on 06/24/2021)     No current facility-administered medications on file prior to visit.    Allergies:  Allergies  Allergen  Reactions   Ramipril Cough    Vital Signs:  BP (!) 172/124    Pulse 73    Ht 5\' 6"  (1.676 m)    SpO2 98%    BMI 37.41 kg/m     Neurological Exam: MENTAL STATUS including orientation to time, place, person, recent and remote memory, attention span and concentration, language, and fund of knowledge is normal.  Speech is not dysarthric.  CRANIAL NERVES:  No visual field defects.  Pupils equal round and reactive to light.  Normal conjugate, extra-ocular eye movements in all directions of gaze.  No ptosis.  Face is symmetric. Palate elevates symmetrically.  Tongue is midline.  MOTOR:  Motor strength is 5/5 in all extremities.  Very mild fine right hand tremor when hands outstretched, no worsening with finger to nose testing.  No atrophy, fasciculations.  No pronator drift.  Tone is normal.    MSRs:  Reflexes are 2+/4 throughout.  SENSORY:  Intact to vibration throughout.  COORDINATION/GAIT:  Normal finger-to- nose-finger.  Intact rapid alternating movements bilaterally.  Gait narrow based and stable.   Data: Lab Results  Component Value Date   TSH 4.08 06/24/2021     IMPRESSION/PLAN: Drug-induced tremor (lithium), less likely benign essential tremor  - Discussed that up to 25% of patient on lithium can develop a tremor and the only way to determine if this is the contributing factor his  drug holiday; however, I do not recommend drug holiday, if lithium is controlling her psychiatric condition in which case it is finding the balance between medication vs side effects.  I recommend she discuss with her psychiatrist about medication management.  Tremors are mild and I do not suggest adding any medication to treat this.  2.   Elevated BP without history of hypertension.  Patient asymptomatic and chest pain, shortness of breath, headache, or vision changes.  BP 172/124 and after recheck 152/112.  With her diastolic BP still elevated, my office contacted her PCP's office for BP check who was  able to graciously accommodate her today.  She has an appointment at 12:30p for BP check.  Total time spent reviewing records, interview, history/exam, documentation, and coordination of care on day of encounter:  30 min   Thank you for allowing me to participate in patient's care.  If I can answer any additional questions, I would be pleased to do so.    Sincerely,    Davon Abdelaziz K. Allena Katz, DO

## 2021-09-29 NOTE — Progress Notes (Signed)
ACUTE VISIT Chief Complaint  Patient presents with   Blood Pressure Check   HPI: [name] is a 41 y.o. female with hx of hypertension, hypothyroidism,HLD,anxiety,and bipolar disorder here today concerned about elevated BP today during appointment with neurologist.BP was elevated at 152/112. She has history of hypertension, she has been on nonpharmacologic treatment. She has not been checking BP regularly.  No new OTC supplement or dietary changes. Some stressors 2 weeks ago.  LMP 09/2021, not sure about date but states that she is "100% sure" she is not pregnant. A couple of "bad headache" in the past few weeks, attributed to stress and similar to her migraines. Occasionally palliations at night also ascribed to stress and has been intermittent for a while.  Hypertension This is a chronic problem. Associated symptoms include anxiety, headaches and palpitations. Pertinent negatives include no blurred vision, chest pain, malaise/fatigue, neck pain, orthopnea, peripheral edema, PND, shortness of breath or sweats. There is no history of angina, kidney disease, CAD/MI or CVA. Identifiable causes of hypertension include a thyroid problem.  She is cooking at home, following low-salt diet. She is not exercising regularly.  Review of Systems  Constitutional:  Negative for chills, fever and malaise/fatigue.  Eyes:  Negative for blurred vision, redness and visual disturbance.  Respiratory:  Negative for cough, shortness of breath and wheezing.   Cardiovascular:  Positive for palpitations. Negative for chest pain, orthopnea and PND.  Gastrointestinal:  Negative for abdominal pain, nausea and vomiting.  Genitourinary:  Negative for decreased urine volume and hematuria.  Musculoskeletal:  Negative for neck pain.  Neurological:  Positive for headaches. Negative for syncope and weakness.  Rest see pertinent positives and negatives per HPI.  Current Outpatient Medications on File  Prior to Visit  Medication Sig Dispense Refill   levothyroxine (SYNTHROID) 100 MCG tablet TAKE 1 TABLET BY MOUTH EVERY DAY BEFORE BREAKFAST 90 tablet 3   lithium carbonate 300 MG capsule 300 mg. 300 mg in am / 600 mg in pm     OVER THE COUNTER MEDICATION Take 1 tablet by mouth daily as needed. Sleep aid     rOPINIRole (REQUIP) 2 MG tablet Take 1 tablet (2 mg total) by mouth at bedtime. 90 tablet 1   traZODone (DESYREL) 50 MG tablet Take 1 tablet (50 mg total) by mouth at bedtime as needed for sleep. 90 tablet 1   No current facility-administered medications on file prior to visit.   Past Medical History:  Diagnosis Date   Anemia    Anxiety    postpartum   Depression    post partum   Gestational diabetes    diet controlled   Hypercalcemia    Hyperlipidemia    Hypertension    not on high blood pressure meds anymore   Hypothyroidism    PE (pulmonary embolism) 2004   Postpartum care following vaginal delivery (11/15) 06/18/2014   Thyroid disease    hypothyroidism   Vitamin D deficiency    Allergies  Allergen Reactions   Ramipril Cough    Social History   Socioeconomic History   Marital status: Married    Spouse name: Jennifer Mcmahon   Number of children: 2   Years of education: 18   Highest education level: Master's degree (e.g., MA, MS, MEng, MEd, MSW, MBA)  Occupational History   Occupation: Environmental education officer: [employer]  Tobacco Use   Smoking status: Former    Types: Cigarettes    Quit date: 02/25/2008  Years since quitting: 13.6   Smokeless tobacco: Never  Vaping Use   Vaping Use: Never used  Substance and Sexual Activity   Alcohol use: Yes    Comment: social   Drug use: No   Sexual activity: Yes    Birth control/protection: None, Condom  Other Topics Concern   Not on file  Social History Narrative   Patient is right-handed. She lives with her husband and two children in a one level home. She drinks 3 cups of coffee and 4 cans of soda a day. She walks her  dog daily.   Social Determinants of Health   Financial Resource Strain: Not on file  Food Insecurity: Not on file  Transportation Needs: Not on file  Physical Activity: Not on file  Stress: Not on file  Social Connections: Not on file   Vitals:   09/29/21 1226  BP: (!) 148/100  Pulse: 87  Resp: 16  SpO2: 97%   Wt Readings from Last 3 Encounters:  09/29/21 222 lb (100.7 kg)  09/29/21 222 lb (100.7 kg)  06/24/21 231 lb 12.8 oz (105.1 kg)   Body mass index is 35.83 kg/m.  Physical Exam Vitals and nursing note reviewed.  Constitutional:      General: She is not in acute distress.    Appearance: She is well-developed.  HENT:     Head: Normocephalic and atraumatic.     Mouth/Throat:     Mouth: Mucous membranes are moist.     Pharynx: Oropharynx is clear.  Eyes:     Conjunctiva/sclera: Conjunctivae normal.  Cardiovascular:     Rate and Rhythm: Normal rate and regular rhythm.     Pulses:          Dorsalis pedis pulses are 2+ on the right side and 2+ on the left side.     Heart sounds: No murmur heard. Pulmonary:     Effort: Pulmonary effort is normal. No respiratory distress.     Breath sounds: Normal breath sounds.  Abdominal:     Palpations: Abdomen is soft. There is no hepatomegaly or mass.     Tenderness: There is no abdominal tenderness.  Lymphadenopathy:     Cervical: No cervical adenopathy.  Skin:    General: Skin is warm.     Findings: No erythema or rash.  Neurological:     General: No focal deficit present.     Mental Status: She is alert and oriented to person, place, and time.     Cranial Nerves: No cranial nerve deficit.     Gait: Gait normal.  Psychiatric:     Comments: Well groomed, good eye contact.   ASSESSMENT AND PLAN:  Jennifer Mcmahon was seen today for blood pressure check.  Diagnoses and all orders for this visit:  Essential hypertension Problem is not well controlled. In the past she has tolerated amlodipine well. Low-salt/DASH diet to  continue. Monitor BP at home. Instructed to let me know her BP numbers in 3 to 4 weeks.  -     amLODipine (NORVASC) 5 MG tablet; Take 1 tablet (5 mg total) by mouth daily.  Severe obesity (BMI 35.0-35.9 with comorbidity) ([place]) We discussed benefits of wt loss. Consistency with healthy diet and physical activity encouraged.  Bipolar disorder in partial remission, most recent episode unspecified type [facility]) Following with psychiatrist.  Return in about 4 months (around 01/27/2022).  Allyson Tineo G. [place], MD  [facility]. [place] office.

## 2021-10-01 ENCOUNTER — Encounter: Payer: Self-pay | Admitting: Family Medicine

## 2021-10-31 NOTE — Progress Notes (Addendum)
? ?HPI: ?Jennifer Mcmahon is a 41 y.o. female, who is here today for follow up. ? ?She was last seen on 09/29/21. ?HTN: Last visit Amlodipine 5 mg daily. ?She has tolerated medication well. ?Negative severe/frequent headache, visual changes, chest pain, dyspnea, palpitation,focal weakness, or edema. ?Palpitations at night have improved, has not any any in the past 2 weeks. ?Home BP's 120's/80-90's. ? ?Lab Results  ?Component Value Date  ? CREATININE 1.13 06/24/2021  ? BUN 13 06/24/2021  ? NA 137 06/24/2021  ? K 4.2 06/24/2021  ? CL 104 06/24/2021  ? CO2 27 06/24/2021  ? ?Hypothyroidism: She is on Levothyroxine 100 mcg daily. ?Negative for cold/heat intolerance, changes in bowel habits,tremor, or abnormal wt loss. ? ?Lab Results  ?Component Value Date  ? TSH 4.08 06/24/2021  ? ?Anxiety seems to be getting worse for the past few years. ?She follows with psychiatrist and has discussed this problem. ? ?She is vegetarian, she acknowledges she eats generous portions.  ?She is not exercising regularly. ? ?Review of Systems  ?Constitutional:  Negative for activity change, appetite change and fever.  ?HENT:  Negative for mouth sores, nosebleeds and sore throat.   ?Respiratory:  Negative for cough and wheezing.   ?Gastrointestinal:  Negative for abdominal pain, nausea and vomiting.  ?Genitourinary:  Negative for decreased urine volume and hematuria.  ?Neurological:  Negative for syncope and facial asymmetry.  ?Psychiatric/Behavioral:  Negative for confusion. The patient is nervous/anxious.   ?Rest see pertinent positives and negatives per HPI. ? ?Current Outpatient Medications on File Prior to Visit  ?Medication Sig Dispense Refill  ? levothyroxine (SYNTHROID) 100 MCG tablet TAKE 1 TABLET BY MOUTH EVERY DAY BEFORE BREAKFAST 90 tablet 3  ? lithium carbonate 300 MG capsule 300 mg. 300 mg in am / 600 mg in pm    ? OVER THE COUNTER MEDICATION Take 1 tablet by mouth daily as needed. Sleep aid    ? rOPINIRole (REQUIP) 2 MG  tablet Take 1 tablet (2 mg total) by mouth at bedtime. 90 tablet 1  ? traZODone (DESYREL) 50 MG tablet Take 1 tablet (50 mg total) by mouth at bedtime as needed for sleep. 90 tablet 1  ? TRINTELLIX 10 MG TABS tablet Take 5 mg by mouth every morning.    ? ?No current facility-administered medications on file prior to visit.  ? ? ?Past Medical History:  ?Diagnosis Date  ? Anemia   ? Anxiety   ? postpartum  ? Depression   ? post partum  ? Gestational diabetes   ? diet controlled  ? Hypercalcemia   ? Hyperlipidemia   ? Hypertension   ? not on high blood pressure meds anymore  ? Hypothyroidism   ? PE (pulmonary embolism) 2004  ? Postpartum care following vaginal delivery (11/15) 06/18/2014  ? Thyroid disease   ? hypothyroidism  ? Vitamin D deficiency   ? ?Allergies  ?Allergen Reactions  ? Ramipril Cough  ? ? ?Social History  ? ?Socioeconomic History  ? Marital status: Married  ?  Spouse name: Barbara Cower  ? Number of children: 2  ? Years of education: 48  ? Highest education level: Master's degree (e.g., MA, MS, MEng, MEd, MSW, MBA)  ?Occupational History  ? Occupation: archives  ?  Employer: [employer]  ?Tobacco Use  ? Smoking status: Former  ?  Types: Cigarettes  ?  Quit date: 02/25/2008  ?  Years since quitting: 13.6  ? Smokeless tobacco: Never  ?Vaping Use  ? Vaping Use:  Never used  ?Substance and Sexual Activity  ? Alcohol use: Yes  ?  Comment: social  ? Drug use: No  ? Sexual activity: Yes  ?  Birth control/protection: None, Condom  ?Other Topics Concern  ? Not on file  ?Social History Narrative  ? Patient is right-handed. She lives with her husband and two children in a one level home. She drinks 3 cups of coffee and 4 cans of soda a day. She walks her dog daily.  ? ?Social Determinants of Health  ? ?Financial Resource Strain: Not on file  ?Food Insecurity: Not on file  ?Transportation Needs: Not on file  ?Physical Activity: Not on file  ?Stress: Not on file  ?Social Connections: Not on file  ? ?Vitals:  ? 11/03/21  0932  ?BP: 128/80  ?Pulse: 90  ?Resp: 16  ?SpO2: 98%  ? ?Wt Readings from Last 3 Encounters:  ?11/03/21 224 lb 6 oz (101.8 kg)  ?09/29/21 222 lb (100.7 kg)  ?09/29/21 222 lb (100.7 kg)  ? ?Body mass index is 36.22 kg/m?. ? ?Physical Exam ?Vitals and nursing note reviewed.  ?Constitutional:   ?   General: She is not in acute distress. ?   Appearance: She is well-developed.  ?HENT:  ?   Head: Normocephalic and atraumatic.  ?   Mouth/Throat:  ?   Mouth: Mucous membranes are moist.  ?   Pharynx: Oropharynx is clear.  ?Eyes:  ?   Conjunctiva/sclera: Conjunctivae normal.  ?Cardiovascular:  ?   Rate and Rhythm: Normal rate and regular rhythm.  ?   Pulses:     ?     Dorsalis pedis pulses are 2+ on the right side and 2+ on the left side.  ?   Heart sounds: No murmur heard. ?Pulmonary:  ?   Effort: Pulmonary effort is normal. No respiratory distress.  ?   Breath sounds: Normal breath sounds.  ?Abdominal:  ?   Palpations: Abdomen is soft. There is no hepatomegaly or mass.  ?   Tenderness: There is no abdominal tenderness.  ?Lymphadenopathy:  ?   Cervical: No cervical adenopathy.  ?Skin: ?   General: Skin is warm.  ?   Findings: No erythema or rash.  ?Neurological:  ?   General: No focal deficit present.  ?   Mental Status: She is alert and oriented to person, place, and time.  ?   Cranial Nerves: No cranial nerve deficit.  ?   Gait: Gait normal.  ?Psychiatric:  ?   Comments: Well groomed, good eye contact.  ? ?ASSESSMENT AND PLAN: ? ?Jennifer Mcmahon was seen today for follow-up. ? ?Diagnoses and all orders for this visit: ?Orders Placed This Encounter  ?Procedures  ? Basic metabolic panel  ? TSH  ? ?Hypothyroidism ?Problem has been well controlled. ?Continue Levothyroxine 100 mcg daily. ?Will check TSH with next labs and recommendations accordingly. ? ?HTN (hypertension) ?BP better controlled, still DBP mildly elevated at home. ?We discussed some options and side effects. ?She agrees with adding HCTZ 25 mg daily. ?BMP in 3 weeks and  BP readings. ?Continue Amlodipine 5 mg daily. ?Continue monitoring BP at home and low salt diet. ? ?Severe obesity (BMI 35.0-35.9 with comorbidity) ([place]) ?Gained about 2 Lb since her last visit. ?We discussed benefits of wt loss. ?Consistency with healthy diet and physical activity encouraged. ?15 min of daily walking recommended. ? ?Return in about 34 weeks (around 06/29/2022) for CPE. Labs in 3 weeks, not fasting. ? ?Floria Brandau G. [place], MD ? ?[facility]. ?[place] office. ? ? ?

## 2021-11-03 ENCOUNTER — Ambulatory Visit: Payer: 59 | Admitting: Family Medicine

## 2021-11-03 ENCOUNTER — Encounter: Payer: Self-pay | Admitting: Family Medicine

## 2021-11-03 VITALS — BP 128/80 | HR 90 | Resp 16 | Ht 66.0 in | Wt 224.4 lb

## 2021-11-03 DIAGNOSIS — E039 Hypothyroidism, unspecified: Secondary | ICD-10-CM | POA: Diagnosis not present

## 2021-11-03 DIAGNOSIS — Z6836 Body mass index (BMI) 36.0-36.9, adult: Secondary | ICD-10-CM

## 2021-11-03 DIAGNOSIS — I10 Essential (primary) hypertension: Secondary | ICD-10-CM | POA: Diagnosis not present

## 2021-11-03 MED ORDER — AMLODIPINE BESYLATE 5 MG PO TABS: 5.0000 mg | ORAL_TABLET | Freq: Every day | ORAL | 1 refills | Status: DC

## 2021-11-03 MED ORDER — HYDROCHLOROTHIAZIDE 25 MG PO TABS: 25.0000 mg | ORAL_TABLET | Freq: Every day | ORAL | 0 refills | Status: DC

## 2021-11-03 NOTE — Patient Instructions (Addendum)
A few things to remember from today's visit: ? ? ?Essential hypertension - Plan: hydrochlorothiazide (HYDRODIURIL) 25 MG tablet ? ?Hypothyroidism, unspecified type ? ?If you need refills please call your pharmacy. ?Do not use My Chart to request refills or for acute issues that need immediate attention. ?  ?Today we added hydrochlorothiazide 25 mg in the morning. ?Labs in 3 weeks with BP readings. ?  ?Please be sure medication list is accurate. ?If a new problem present, please set up appointment sooner than planned today. ? ? ? ? ? ? ? ?

## 2021-11-03 NOTE — Assessment & Plan Note (Addendum)
BP better controlled, still DBP mildly elevated at home. ?We discussed some options and side effects. ?She agrees with adding HCTZ 25 mg daily. ?BMP in 3 weeks and BP readings. ?Continue Amlodipine 5 mg daily. ?Continue monitoring BP at home and low salt diet. ?

## 2021-11-03 NOTE — Assessment & Plan Note (Signed)
Gained about 2 Lb since her last visit. ?We discussed benefits of wt loss. ?Consistency with healthy diet and physical activity encouraged. ?15 min of daily walking recommended. ? ? ?

## 2021-11-03 NOTE — Assessment & Plan Note (Signed)
Problem has been well controlled. ?Continue Levothyroxine 100 mcg daily. ?Will check TSH with next labs and recommendations accordingly. ?

## 2021-11-24 ENCOUNTER — Other Ambulatory Visit (INDEPENDENT_AMBULATORY_CARE_PROVIDER_SITE_OTHER): Payer: 59

## 2021-11-24 DIAGNOSIS — I10 Essential (primary) hypertension: Secondary | ICD-10-CM | POA: Diagnosis not present

## 2021-11-24 DIAGNOSIS — E039 Hypothyroidism, unspecified: Secondary | ICD-10-CM | POA: Diagnosis not present

## 2021-11-24 LAB — BASIC METABOLIC PANEL
BUN: 13 mg/dL (ref 6–23)
CO2: 25 mEq/L (ref 19–32)
Calcium: 10.1 mg/dL (ref 8.4–10.5)
Chloride: 103 mEq/L (ref 96–112)
Creatinine, Ser: 1.04 mg/dL (ref 0.40–1.20)
GFR: 66.16 mL/min (ref >60.00)
Glucose, Bld: 96 mg/dL (ref 70–99)
Potassium: 4 mEq/L (ref 3.5–5.1)
Sodium: 137 mEq/L (ref 135–145)

## 2021-11-24 LAB — TSH: TSH: 3.76 u[IU]/mL (ref 0.35–5.50)

## 2021-12-19 NOTE — Progress Notes (Signed)
HPI: [name] is a 41 y.o. female, who is here today for follow up. She was seen on 11/03/21.  Hypertension:  Medications:Amlodipine 5 mg daily and HCTZ 25 mg daily. BP readings at home:120-130's/90's. Side effects:None. Negative for unusual or severe headache, visual changes, exertional chest pain, dyspnea,  focal weakness, or edema. + Fatigue. She does not feel rested when she wakes up in the morning. Sleepy through the day. No known OSA.  Lab Results  Component Value Date   CREATININE 1.04 11/24/2021   BUN 13 11/24/2021   NA 137 11/24/2021   K 4.0 11/24/2021   CL 103 11/24/2021   CO2 25 11/24/2021   She would like to see dermatologist.  She has had 2 plantar warts on left foot, she has tried several OTC medications.  Now she is having some pain on one of them. No hx of trauma.  Also concerned about hair loss. She used to pull out her hair when anxious, she has tried not to do so for the past 2 months. Parietal hair thinning. No scalp lesions/rash. She has not used OTC products. FHx negative for alopecia.  She has been under a lot of stress. Unemployed, looking for a job. She is under her husband insurance and seems like it is not longer covering visits to her psychiatrist. Bipolar disorder on Lithium and Trazodone. She is not taking Trintellix.  Her husband is very supportive.  Review of Systems  Constitutional:  Positive for fatigue. Negative for activity change, appetite change and unexpected weight change.  HENT:  Negative for mouth sores, nosebleeds and sore throat.   Respiratory:  Negative for cough and wheezing.   Gastrointestinal:  Negative for abdominal pain, nausea and vomiting.       Negative for changes in bowel habits.  Endocrine: Negative for cold intolerance and heat intolerance.  Genitourinary:  Negative for decreased urine volume and hematuria.  Neurological:  Negative for syncope and facial asymmetry.  Psychiatric/Behavioral:   Positive for sleep disturbance. Negative for confusion and hallucinations. The patient is nervous/anxious.   Rest see pertinent positives and negatives per HPI.  Current Outpatient Medications on File Prior to Visit  Medication Sig Dispense Refill   hydrochlorothiazide (HYDRODIURIL) 25 MG tablet Take 1 tablet (25 mg total) by mouth daily. 90 tablet 0   levothyroxine (SYNTHROID) 100 MCG tablet TAKE 1 TABLET BY MOUTH EVERY DAY BEFORE BREAKFAST 90 tablet 3   lithium carbonate 300 MG capsule 300 mg. 300 mg in am / 600 mg in pm     OVER THE COUNTER MEDICATION Take 1 tablet by mouth daily as needed. Sleep aid     rOPINIRole (REQUIP) 2 MG tablet Take 1 tablet (2 mg total) by mouth at bedtime. 90 tablet 1   TRINTELLIX 10 MG TABS tablet Take 5 mg by mouth every morning. (Patient not taking: Reported on 12/22/2021)     No current facility-administered medications on file prior to visit.   Past Medical History:  Diagnosis Date   Anemia    Anxiety    postpartum   Depression    post partum   Gestational diabetes    diet controlled   Hypercalcemia    Hyperlipidemia    Hypertension    not on high blood pressure meds anymore   Hypothyroidism    PE (pulmonary embolism) 2004   Postpartum care following vaginal delivery (11/15) 06/18/2014   Thyroid disease    hypothyroidism   Vitamin D deficiency    Allergies  Allergen Reactions   Ramipril Cough   Social History   Socioeconomic History   Marital status: Married    Spouse name: Barbara Cower   Number of children: 2   Years of education: 18   Highest education level: Master's degree (e.g., MA, MS, MEng, MEd, MSW, MBA)  Occupational History   Occupation: Careers adviser: [employer]  Tobacco Use   Smoking status: Former    Types: Cigarettes    Quit date: 02/25/2008    Years since quitting: 13.8   Smokeless tobacco: Never  Vaping Use   Vaping Use: Never used  Substance and Sexual Activity   Alcohol use: Yes    Comment: social    Drug use: No   Sexual activity: Yes    Birth control/protection: None, Condom  Other Topics Concern   Not on file  Social History Narrative   Patient is right-handed. She lives with her husband and two children in a one level home. She drinks 3 cups of coffee and 4 cans of soda a day. She walks her dog daily.   Social Determinants of Health   Financial Resource Strain: Not on file  Food Insecurity: Not on file  Transportation Needs: Not on file  Physical Activity: Not on file  Stress: Not on file  Social Connections: Not on file   Vitals:   12/22/21 0837  BP: (!) 130/98  Pulse: 90  Resp: 16  Temp: 98.4 F (36.9 C)  SpO2: 97%   Wt Readings from Last 3 Encounters:  11/03/21 224 lb 6 oz (101.8 kg)  09/29/21 222 lb (100.7 kg)  09/29/21 222 lb (100.7 kg)   There is no height or weight on file to calculate BMI.  Physical Exam Vitals and nursing note reviewed.  Constitutional:      General: She is not in acute distress.    Appearance: She is well-developed.  HENT:     Head: Normocephalic and atraumatic.     Mouth/Throat:     Mouth: Mucous membranes are moist.     Pharynx: Oropharynx is clear.  Eyes:     Conjunctiva/sclera: Conjunctivae normal.  Cardiovascular:     Rate and Rhythm: Normal rate and regular rhythm.     Pulses:          Dorsalis pedis pulses are 2+ on the right side and 2+ on the left side.     Heart sounds: No murmur heard. Pulmonary:     Effort: Pulmonary effort is normal. No respiratory distress.     Breath sounds: Normal breath sounds.  Abdominal:     Palpations: Abdomen is soft. There is no hepatomegaly or mass.     Tenderness: There is no abdominal tenderness.  Musculoskeletal:       Feet:  Lymphadenopathy:     Cervical: No cervical adenopathy.  Skin:    General: Skin is warm.     Findings: No erythema or rash.     Comments: Scattered parietal alopecia. Pulling hair test negative. No scalp lesions.  Neurological:     General: No focal  deficit present.     Mental Status: She is alert and oriented to person, place, and time.     Cranial Nerves: No cranial nerve deficit.     Gait: Gait normal.  Psychiatric:        Mood and Affect: Mood is anxious.     Comments: Well groomed, good eye contact.   ASSESSMENT AND PLAN:  [name] was seen today for follow-up.  Diagnoses and all orders for this visit: Orders Placed This Encounter  Procedures   CBC   T4, free   TSH   Ferritin   Iron   Ambulatory referral to Sleep Studies   Ambulatory referral to Dermatology   Lab Results  Component Value Date   TSH 5.68 (H) 12/22/2021   Lab Results  Component Value Date   WBC 12.7 (H) 12/22/2021   HGB 14.0 12/22/2021   HCT 41.2 12/22/2021   MCV 88.1 12/22/2021   PLT 272.0 12/22/2021   Daytime hypersomnia Some of her chronic medical conditions can be contributing factors (bipolar,anxiety,and insomnia). Sleep study will be arranged to evaluated for sleep apnea. Wt loss will help.  Primary hypertension DPB still not at goal. Sleep study will be arranged. Amlodipine increased from 5 mg to 10 mg. No changes in HCTZ. Continue low salt diet.  -     amLODipine (NORVASC) 10 MG tablet; Take 1 tablet (10 mg total) by mouth daily.  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 Labs ordered today. Derma referral placed.  Plantar wart Now causing pain and not responding to OTC treatments. Derma referral placed as requested.  Hypothyroidism TSH has been in the upper normal range, this problem could be contributing to her hair loss. For now continue levothyroxine same dose. Further recommendation will be given according to TSH result.  Return in about 3 months (around 03/24/2022).  Jennifer Lapre G. [place], MD  [facility]. [place] office.

## 2021-12-22 ENCOUNTER — Ambulatory Visit: Payer: 59 | Admitting: Family Medicine

## 2021-12-22 VITALS — BP 130/98 | HR 90 | Temp 98.4°F | Resp 16 | Ht 66.0 in

## 2021-12-22 DIAGNOSIS — I10 Essential (primary) hypertension: Secondary | ICD-10-CM

## 2021-12-22 DIAGNOSIS — L659 Nonscarring hair loss, unspecified: Secondary | ICD-10-CM

## 2021-12-22 DIAGNOSIS — B070 Plantar wart: Secondary | ICD-10-CM

## 2021-12-22 DIAGNOSIS — E039 Hypothyroidism, unspecified: Secondary | ICD-10-CM | POA: Diagnosis not present

## 2021-12-22 DIAGNOSIS — G4710 Hypersomnia, unspecified: Secondary | ICD-10-CM

## 2021-12-22 DIAGNOSIS — E785 Hyperlipidemia, unspecified: Secondary | ICD-10-CM

## 2021-12-22 LAB — CBC
HCT: 41.2 % (ref 36.0–46.0)
Hemoglobin: 14 g/dL (ref 12.0–15.0)
MCHC: 33.8 g/dL (ref 30.0–36.0)
MCV: 88.1 fl (ref 78.0–100.0)
Platelets: 272 10*3/uL (ref 150.0–400.0)
RBC: 4.68 Mil/uL (ref 3.87–5.11)
RDW: 13.3 % (ref 11.5–15.5)
WBC: 12.7 10*3/uL — ABNORMAL HIGH (ref 4.0–10.5)

## 2021-12-22 LAB — FERRITIN: Ferritin: 29.5 ng/mL (ref 10.0–291.0)

## 2021-12-22 LAB — IRON: Iron: 56 ug/dL (ref 42–145)

## 2021-12-22 LAB — T4, FREE: Free T4: 0.85 ng/dL (ref 0.60–1.60)

## 2021-12-22 LAB — TSH: TSH: 5.68 u[IU]/mL — ABNORMAL HIGH (ref 0.35–5.50)

## 2021-12-22 MED ORDER — AMLODIPINE BESYLATE 10 MG PO TABS: 10.0000 mg | ORAL_TABLET | Freq: Every day | ORAL | 2 refills | Status: DC

## 2021-12-22 NOTE — Assessment & Plan Note (Signed)
TSH has been in the upper normal range, this problem could be contributing to her hair loss. For now continue levothyroxine same dose. Further recommendation will be given according to TSH result.

## 2021-12-22 NOTE — Patient Instructions (Addendum)
A few things to remember from today's visit:   Hypothyroidism, unspecified type - Plan: T4, free, TSH  Primary hypertension - Plan: Ambulatory referral to Sleep Studies, amLODipine (NORVASC) 10 MG tablet  Hyperlipidemia, unspecified hyperlipidemia type  Daytime hypersomnia - Plan: Ambulatory referral to Sleep Studies  Hair loss disorder - Plan: Ambulatory referral to Dermatology, CBC, Ferritin, Iron  If you need refills please call your pharmacy. Do not use My Chart to request refills or for acute issues that need immediate attention.   Please be sure medication list is accurate. If a new problem present, please set up appointment sooner than planned today.  We will check thyroid again and if still upper normal we could increase thyroid medication dose. Rogaine over the counter may help with hair loss. Amlodipine increased from 5 mg to 10 mg. No changes in rest of meds.

## 2021-12-25 ENCOUNTER — Other Ambulatory Visit: Payer: Self-pay | Admitting: Family Medicine

## 2021-12-25 DIAGNOSIS — G4700 Insomnia, unspecified: Secondary | ICD-10-CM

## 2021-12-26 ENCOUNTER — Encounter: Payer: Self-pay | Admitting: Family Medicine

## 2021-12-26 MED ORDER — LEVOTHYROXINE SODIUM 112 MCG PO TABS: 112.0000 ug | ORAL_TABLET | Freq: Every day | ORAL | 0 refills | Status: DC

## 2022-01-31 ENCOUNTER — Other Ambulatory Visit: Payer: Self-pay | Admitting: Family Medicine

## 2022-01-31 DIAGNOSIS — E039 Hypothyroidism, unspecified: Secondary | ICD-10-CM

## 2022-02-12 ENCOUNTER — Ambulatory Visit (INDEPENDENT_AMBULATORY_CARE_PROVIDER_SITE_OTHER): Payer: 59 | Admitting: Neurology

## 2022-02-12 ENCOUNTER — Encounter: Payer: Self-pay | Admitting: Neurology

## 2022-02-12 VITALS — BP 148/92 | HR 84 | Ht 66.0 in | Wt 221.0 lb

## 2022-02-12 DIAGNOSIS — G4700 Insomnia, unspecified: Secondary | ICD-10-CM

## 2022-02-12 DIAGNOSIS — R351 Nocturia: Secondary | ICD-10-CM

## 2022-02-12 DIAGNOSIS — E669 Obesity, unspecified: Secondary | ICD-10-CM

## 2022-02-12 DIAGNOSIS — R0683 Snoring: Secondary | ICD-10-CM | POA: Diagnosis not present

## 2022-02-12 DIAGNOSIS — G4730 Sleep apnea, unspecified: Secondary | ICD-10-CM | POA: Diagnosis not present

## 2022-02-12 DIAGNOSIS — G2581 Restless legs syndrome: Secondary | ICD-10-CM

## 2022-02-12 DIAGNOSIS — R519 Headache, unspecified: Secondary | ICD-10-CM

## 2022-02-12 DIAGNOSIS — G4750 Parasomnia, unspecified: Secondary | ICD-10-CM

## 2022-02-12 NOTE — Patient Instructions (Signed)

## 2022-02-12 NOTE — Progress Notes (Signed)
Subjective:    Patient ID: Jennifer Mcmahon is a 41 y.o. female.  HPI    Huston Foley, MD, PhD [facility] [address], [address] [address] [place], [place] [address]  Dear Dr. [place],   I saw your patient, Jennifer Mcmahon, upon your kind request in my sleep clinic today for initial consultation of her sleep disorder, in particular, concern for underlying obstructive sleep apnea.  The patient is unaccompanied today.  As you know, [name] is a 41 year old right-handed woman with an underlying medical history of hyperlipidemia hypothyroidism, history of PE, vitamin D deficiency, anxiety, depression, anemia, migraines and tremor (followed by Dr. Allena Katz at [facility]), and obesity, who reports snoring and excessive daytime somnolence, chronic difficulty initiating and maintaining sleep for years.  She has had recurrent morning headaches.  She is currently on trazodone 50 mg at night, also takes over-the-counter sleep aids.  She has been on ropinirole for restless leg syndrome and currently her symptoms are under fairly good control with ropinirole 2 mg strength at night.  She is not aware of any family history of sleep apnea.  She lives with her family including husband and 2 children, ages 78 and 63.  Her younger son, age 69, sometimes sleeps in the bed with her.  They have 1 dog and 1 cat in the household.  For chronic sleep issues she has tried prescription medication such as Ambien and Valium in the past.  She has tried cognitive behavioral therapy which was not effective.  She has a history of parasomnias, acting out in her sleep, moving and yelling.  She has a bedtime around 11 and rise time at 6:35 AM.  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  She has nocturia about once per average night and has woken up with a headache fairly often , this headache is typically not migrainous, dull and achy.  not I reviewed your office note from 12/22/2021.  Her  [place] She drinks caffeine in the form of coffee, about 3 to 4 cups/day, soda, about 3 to 4 cans/day, occasionally energy drink, maybe every other day, she, 2-3 servings per day, caffeine as late as 7 PM.  Her weight has been stable.  She has gained weight over time.  She does not drink any alcohol.  She smokes about 10 to 15 cigarettes/day, has had fluctuation in her smoking, even quit in the past.  She does not have a TV in her bedroom.  Her Past Medical History Is Significant For: Past Medical History:  Diagnosis Date   Anemia    Anxiety    postpartum   Depression    post partum   Gestational diabetes    diet controlled   Headache    "major issue in the past 4 years"   Hypercalcemia    Hyperlipidemia    Hypertension    not on high blood pressure meds anymore   Hypothyroidism    PE (pulmonary embolism) 2004   Postpartum care following vaginal delivery (11/15) 06/18/2014   Thyroid disease    hypothyroidism   Vitamin D deficiency     Her Past Surgical History Is Significant For: Past Surgical History:  Procedure Laterality Date   CHOLECYSTECTOMY N/A 06/12/2016   Procedure: LAPAROSCOPIC CHOLECYSTECTOMY;  Surgeon: Axel Filler, MD;  Location: [facility] OR;  Service: General;  Laterality: N/A;   EYE SURGERY     lasik   WISDOM TOOTH EXTRACTION  Her Family History Is Significant For: Family History  Problem Relation Age of Onset   Cancer Mother        uterine and ovarian   Hypertension Mother    Varicose Veins Mother    Thyroid disease Mother    Hyperlipidemia Mother    Mental illness Mother    Tuberculosis Sister    Heart disease Maternal Grandmother    Varicose Veins Maternal Grandfather    Mental illness Maternal Grandfather    Cancer Paternal Grandmother    Bipolar disorder Paternal Grandfather    Sleep apnea Neg Hx     Her Social History Is Significant For: Social History   Socioeconomic History   Marital status: Married    Spouse name: Corene Cornea   Number of  children: 2   Years of education: 18   Highest education level: Master's degree (e.g., MA, MS, MEng, MEd, MSW, MBA)  Occupational History   Occupation: Environmental education officer: [employer]  Tobacco Use   Smoking status: Some Days    Types: Cigarettes    Last attempt to quit: 02/25/2008    Years since quitting: 13.9   Smokeless tobacco: Never   Tobacco comments:    Has stopped and started smoking for years. Some days no smoking, other days chain smoking  Vaping Use   Vaping Use: Never used  Substance and Sexual Activity   Alcohol use: Yes    Comment: social   Drug use: No   Sexual activity: Yes    Birth control/protection: Condom  Other Topics Concern   Not on file  Social History Narrative   Patient is right-handed. She lives with her husband and two children in a one level home. She drinks multiple cups of caffeine daily (sources: coffee, soda, monster energy drink, tea). She walks her dog daily.   Social Determinants of Health   Financial Resource Strain: Not on file  Food Insecurity: Not on file  Transportation Needs: Not on file  Physical Activity: Not on file  Stress: Not on file  Social Connections: Not on file    Her Allergies Are:  Allergies  Allergen Reactions   Ramipril Cough  :   Her Current Medications Are:  Outpatient Encounter Medications as of 02/12/2022  Medication Sig   amLODipine (NORVASC) 10 MG tablet Take 1 tablet (10 mg total) by mouth daily.   hydrochlorothiazide (HYDRODIURIL) 25 MG tablet Take 1 tablet (25 mg total) by mouth daily.   levothyroxine (SYNTHROID) 112 MCG tablet Take 1 tablet (112 mcg total) by mouth daily.   lithium carbonate 300 MG capsule 300 mg. 300 mg in am / 600 mg in pm   OVER THE COUNTER MEDICATION Take 1 tablet by mouth daily as needed. Sleep aid   rOPINIRole (REQUIP) 2 MG tablet Take 1 tablet (2 mg total) by mouth at bedtime.   traZODone (DESYREL) 50 MG tablet TAKE 1 TABLET BY MOUTH AT BEDTIME AS NEEDED FOR SLEEP.    TRINTELLIX 10 MG TABS tablet Take 5 mg by mouth every morning. (Patient not taking: Reported on 12/22/2021)   No facility-administered encounter medications on file as of 02/12/2022.  :   Review of Systems:  Out of a complete 14 point review of systems, all are reviewed and negative with the exception of these symptoms as listed below:  Review of Systems  Neurological:        Patient is here alone for sleep consult. She states she does not feel rested during the day.  When she does nap she feels  she sleeps better than at night. She has never had a sleep study before. She takes an OTC sleep aid plus Trazodone every night and they do not work very well. They only make her a little bit sleepy.    Objective:  Neurological Exam  Physical Exam Physical Examination:   Vitals:   02/12/22 1106  BP: (!) 148/92  Pulse: 84  SpO2: 96%    General Examination: The patient is a very pleasant 41 y.o. female in no acute distress. She appears well-developed and well-nourished and well groomed.   HEENT: Normocephalic, atraumatic, pupils are equal, round and reactive to light, extraocular tracking is good without obvious limitation to gaze excursion or obvious nystagmus noted, corrective eyeglasses in place.  Hearing is grossly intact. Face is symmetric with normal facial animation. Speech is clear with no dysarthria noted. There is no hypophonia. There is no lip, neck/head, jaw or voice tremor. Neck is supple with full range of passive and active motion. There are no carotid bruits on auscultation. Oropharynx exam reveals: mild mouth dryness, good dental hygiene and mild airway crowding, due to smaller airway entry and tonsils in place, about 1+ b/l, thicker tongue, Mallampati is class I. Tongue protrudes centrally and palate elevates symmetrically. Minimal overbite.   Chest: Clear to auscultation without wheezing, rhonchi or crackles noted.  Heart: S1+S2+0, regular and normal without murmurs, rubs or  gallops noted.   Abdomen: Soft, non-tender and non-distended.  Extremities: There is no obvious edema in the distal lower extremities bilaterally.   Skin: Warm and dry without trophic changes noted.   Musculoskeletal: exam reveals no obvious joint deformities, tenderness or joint swelling or erythema.   Neurologically:  Mental status: The patient is awake, alert and oriented in all 4 spheres. Her immediate and remote memory, attention, language skills and fund of knowledge are appropriate. There is no evidence of aphasia, agnosia, apraxia or anomia. Speech is clear with normal prosody and enunciation. Thought process is linear. Mood is normal and affect is normal.  Cranial nerves II - XII are as described above under HEENT exam.  Motor exam: Normal bulk, strength and tone is noted. There is no resting tremor. Fine motor skills and coordination: grossly intact.  Cerebellar testing: No dysmetria or intention tremor. There is no truncal or gait ataxia.  Sensory exam: intact to light touch in the upper and lower extremities.  Gait, station and balance: She stands easily. No veering to one side is noted. No leaning to one side is noted. Posture is age-appropriate and stance is narrow based. Gait shows normal stride length and normal pace. No problems turning are noted.   Assessment and Plan:  In summary, Whitnie Moots is a very pleasant 41 y.o.-year old female with an underlying medical history of hyperlipidemia hypothyroidism, history of PE, vitamin D deficiency, anxiety, depression, anemia, migraines and tremor (followed by Dr. Posey Pronto at [facility]), and obesity, whose history and physical exam concerning for sleep disordered breathing, supporting a current working diagnosis of unspecified sleep apnea, with the main differential diagnoses of obstructive sleep apnea (OSA) versus upper airway resistance syndrome (UARS) versus central sleep apnea (CSA), or mixed sleep apnea. A laboratory attended sleep  study is considered gold standard for evaluation of sleep disordered breathing and is recommended at this time and clinically justified.   I had a long chat with the patient about my findings and the diagnosis of sleep apnea , particularly OSA, its prognosis and treatment  options. We talked about medical/conservative treatments, surgical interventions and non-pharmacological approaches for symptom control. I explained, in particular, the risks and ramifications of untreated moderate to severe OSA, especially with respect to developing cardiovascular disease down the road, including congestive heart failure (CHF), difficult to treat hypertension, cardiac arrhythmias (particularly A-fib), neurovascular complications including TIA, stroke and dementia. Even type 2 diabetes has, in part, been linked to untreated OSA. Symptoms of untreated OSA may include (but may not be limited to) daytime sleepiness, nocturia (i.e. frequent nighttime urination), memory problems, mood irritability and suboptimally controlled or worsening mood disorder such as depression and/or anxiety, lack of energy, lack of motivation, physical discomfort, as well as recurrent headaches, especially morning or nocturnal headaches. We talked about the importance of maintaining a healthy lifestyle and striving for healthy weight.  The importance of smoking cessation.  In addition, we talked about the importance of striving for and maintaining good sleep hygiene.  She is advised to scale back on her caffeine intake.  She is advised to limit her caffeine and avoid after 3 PM. I recommended the following at this time: sleep study.  I outlined the differences between a laboratory attended sleep study which is considered more comprehensive and accurate over the option of a home sleep test (HST); the latter may lead to underestimation of sleep disordered breathing in some instances and does not help with diagnosing upper airway resistance syndrome and is not  accurate enough to diagnose primary central sleep apnea typically. I explained the different sleep test procedures to the patient in detail and also outlined possible surgical and non-surgical treatment options of OSA, including the use of a pressure airway pressure (PAP) device (ie CPAP, AutoPAP/APAP or BiPAP in certain circumstances), a custom-made dental device (aka oral appliance, which would require a referral to a specialist dentist or orthodontist typically, and is generally speaking not considered a good choice for patients with full dentures or edentulous state), upper airway surgical options, such as traditional UPPP (which is not considered a first-line treatment) or the Inspire device (hypoglossal nerve stimulator, which would involve a referral for consultation with an ENT surgeon, after careful selection, following inclusion criteria). I explained the PAP treatment option to the patient in detail, as this is generally considered first-line treatment.  The patient indicated that she would be willing to try PAP therapy, if the need arises. I explained the importance of being compliant with PAP treatment, not only for insurance purposes but primarily to improve patient's symptoms symptoms, and for the patient's long term health benefit, including to reduce Her cardiovascular risks longer-term.    We will pick up our discussion about the next steps and treatment options after testing.  We will keep her posted as to the test results by phone call and/or MyChart messaging where possible.  We will plan to follow-up in sleep clinic accordingly as well.  I answered all her questions today and the patient was in agreement.   I encouraged her to call with any interim questions, concerns, problems or updates or email [place] through [place].  Generally speaking, sleep test authorizations may take up to 2 weeks, sometimes less, sometimes longer, the patient is encouraged to get in touch with [place] if they do not hear  back from the sleep lab staff directly within the next 2 weeks.  Thank you very much for allowing me to participate in the care of this nice patient. If I can be of any further assistance to you please do not  hesitate to call me at [phone].  Sincerely,   Huston Foley, MD, PhD

## 2022-03-16 ENCOUNTER — Telehealth: Payer: Self-pay | Admitting: Neurology

## 2022-03-16 NOTE — Telephone Encounter (Signed)
I spoke with the patient and informed her that [employer] informed me that the plan was termed on 01/16/22. She stated that she would have to get with her husband because he is the primary holder.  I gave her my direct number and she will give me a call when it is all figured out so I can do the PA for her sleep study.

## 2022-03-22 ENCOUNTER — Other Ambulatory Visit: Payer: Self-pay | Admitting: Family Medicine

## 2022-03-22 DIAGNOSIS — E039 Hypothyroidism, unspecified: Secondary | ICD-10-CM

## 2022-03-24 NOTE — Progress Notes (Deleted)
HPI:  [name] is a 41 y.o. female, who is here today to follow on recent visit.  Review of Systems Rest see pertinent positives and negatives per HPI.  Current Outpatient Medications on File Prior to Visit  Medication Sig Dispense Refill   amLODipine (NORVASC) 10 MG tablet Take 1 tablet (10 mg total) by mouth daily. 90 tablet 2   hydrochlorothiazide (HYDRODIURIL) 25 MG tablet Take 1 tablet (25 mg total) by mouth daily. 90 tablet 0   levothyroxine (SYNTHROID) 112 MCG tablet TAKE 1 TABLET BY MOUTH EVERY DAY 90 tablet 0   lithium carbonate 300 MG capsule 300 mg. 300 mg in am / 600 mg in pm     OVER THE COUNTER MEDICATION Take 1 tablet by mouth daily as needed. Sleep aid     rOPINIRole (REQUIP) 2 MG tablet Take 1 tablet (2 mg total) by mouth at bedtime. 90 tablet 1   traZODone (DESYREL) 50 MG tablet TAKE 1 TABLET BY MOUTH AT BEDTIME AS NEEDED FOR SLEEP. 90 tablet 1   TRINTELLIX 10 MG TABS tablet Take 5 mg by mouth every morning. (Patient not taking: Reported on 12/22/2021)     No current facility-administered medications on file prior to visit.    Past Medical History:  Diagnosis Date   Anemia    Anxiety    postpartum   Depression    post partum   Gestational diabetes    diet controlled   Headache    "major issue in the past 4 years"   Hypercalcemia    Hyperlipidemia    Hypertension    not on high blood pressure meds anymore   Hypothyroidism    PE (pulmonary embolism) 2004   Postpartum care following vaginal delivery (11/15) 06/18/2014   Thyroid disease    hypothyroidism   Vitamin D deficiency    Allergies  Allergen Reactions   Ramipril Cough    Social History   Socioeconomic History   Marital status: Married    Spouse name: Jennifer Mcmahon   Number of children: 2   Years of education: 18   Highest education level: Master's degree (e.g., MA, MS, MEng, MEd, MSW, MBA)  Occupational History   Occupation: Careers adviser: [employer]  Tobacco Use    Smoking status: Some Days    Types: Cigarettes    Last attempt to quit: 02/25/2008    Years since quitting: 14.0   Smokeless tobacco: Never   Tobacco comments:    Has stopped and started smoking for years. Some days no smoking, other days chain smoking  Vaping Use   Vaping Use: Never used  Substance and Sexual Activity   Alcohol use: Yes    Comment: social   Drug use: No   Sexual activity: Yes    Birth control/protection: Condom  Other Topics Concern   Not on file  Social History Narrative   Patient is right-handed. She lives with her husband and two children in a one level home. She drinks multiple cups of caffeine daily (sources: coffee, soda, monster energy drink, tea). She walks her dog daily.   Social Determinants of Health   Financial Resource Strain: Not on file  Food Insecurity: Not on file  Transportation Needs: Not on file  Physical Activity: Not on file  Stress: Not on file  Social Connections: Not on file    There were no vitals filed for this visit. There is no height or weight on file to calculate BMI.  Physical Exam  ASSESSMENT AND PLAN:   There are no diagnoses linked to this encounter.  No orders of the defined types were placed in this encounter.   No problem-specific Assessment & Plan notes found for this encounter.   No follow-ups on file.   Betty G. [place], MD  [facility]. [place] office.

## 2022-03-25 ENCOUNTER — Ambulatory Visit: Payer: 59 | Admitting: Family Medicine

## 2022-04-15 ENCOUNTER — Other Ambulatory Visit: Payer: Self-pay | Admitting: Family Medicine

## 2022-04-15 DIAGNOSIS — G2581 Restless legs syndrome: Secondary | ICD-10-CM

## 2022-04-15 DIAGNOSIS — I10 Essential (primary) hypertension: Secondary | ICD-10-CM

## 2022-06-29 ENCOUNTER — Ambulatory Visit (INDEPENDENT_AMBULATORY_CARE_PROVIDER_SITE_OTHER): Payer: 59 | Admitting: Family Medicine

## 2022-06-29 ENCOUNTER — Encounter: Payer: Self-pay | Admitting: Family Medicine

## 2022-06-29 VITALS — BP 128/80 | HR 99 | Temp 98.9°F | Resp 12 | Ht 66.0 in | Wt 216.4 lb

## 2022-06-29 DIAGNOSIS — E039 Hypothyroidism, unspecified: Secondary | ICD-10-CM

## 2022-06-29 DIAGNOSIS — Z0000 Encounter for general adult medical examination without abnormal findings: Secondary | ICD-10-CM | POA: Diagnosis not present

## 2022-06-29 DIAGNOSIS — I10 Essential (primary) hypertension: Secondary | ICD-10-CM

## 2022-06-29 DIAGNOSIS — G2581 Restless legs syndrome: Secondary | ICD-10-CM

## 2022-06-29 DIAGNOSIS — E785 Hyperlipidemia, unspecified: Secondary | ICD-10-CM | POA: Diagnosis not present

## 2022-06-29 LAB — COMPREHENSIVE METABOLIC PANEL
ALT: 14 U/L (ref 0–35)
AST: 16 U/L (ref 0–37)
Albumin: 4.3 g/dL (ref 3.5–5.2)
Alkaline Phosphatase: 48 U/L (ref 39–117)
BUN: 13 mg/dL (ref 6–23)
CO2: 24 mEq/L (ref 19–32)
Calcium: 9.9 mg/dL (ref 8.4–10.5)
Chloride: 104 mEq/L (ref 96–112)
Creatinine, Ser: 1.08 mg/dL (ref 0.40–1.20)
GFR: 62.97 mL/min (ref >60.00)
Glucose, Bld: 86 mg/dL (ref 70–99)
Potassium: 4.1 mEq/L (ref 3.5–5.1)
Sodium: 137 mEq/L (ref 135–145)
Total Bilirubin: 0.4 mg/dL (ref 0.2–1.2)
Total Protein: 7.6 g/dL (ref 6.0–8.3)

## 2022-06-29 LAB — LIPID PANEL
Cholesterol: 223 mg/dL — ABNORMAL HIGH (ref 0–200)
HDL: 52.3 mg/dL (ref >39.00)
LDL Cholesterol: 138 mg/dL — ABNORMAL HIGH (ref 0–99)
NonHDL: 170.46
Total CHOL/HDL Ratio: 4
Triglycerides: 162 mg/dL — ABNORMAL HIGH (ref 0.0–149.0)
VLDL: 32.4 mg/dL (ref 0.0–40.0)

## 2022-06-29 LAB — TSH: TSH: 6.66 u[IU]/mL — ABNORMAL HIGH (ref 0.35–5.50)

## 2022-06-29 MED ORDER — ROPINIROLE HCL 5 MG PO TABS: 5.0000 mg | ORAL_TABLET | Freq: Every day | ORAL | 2 refills | Status: DC

## 2022-06-29 NOTE — Assessment & Plan Note (Signed)
We discussed the importance of regular physical activity and healthy diet for prevention of chronic illness and/or complications. Preventive guidelines reviewed. Vaccination up to date. Continue her female preventive care with her gyn, she is overdue for mammogram. Next CPE in a year.

## 2022-06-29 NOTE — Progress Notes (Unsigned)
HPI: [name] is a 41 y.o. female with medical history significant for hypertension, hyperlipidemia, hypothyroidism, anxiety disorder, bipolar disorder, and gestational diabetes here today for her routine physical.  Last CPE: 06/24/2021 No new problems since her last visit.  She is not exercising regularly, she is active at home primarily through household chores. She cooks at home and consumes daily vegetables, with fish being her primary protein source.  Sleeps about 6 hours.  She smokes on and off, depending of stress levels but does not consume alcohol.  Immunization History  Administered Date(s) Administered   Influenza Inj Mdck Quad Pf 04/27/2018   Influenza,inj,Quad PF,6+ Mos 05/08/2013, 04/15/2015, 04/30/2016, 07/19/2017, 05/08/2019   Influenza-Unspecified 04/27/2018   Tdap 11/05/2002, 05/08/2013   She sees her gyn regularly. She had a mammogram in spring 2020, which resulted in a benign cyst biopsy.  She has already received her flu shot. Health Maintenance  Topic Date Due   PAP SMEAR-Modifier  05/08/2016   COVID-19 Vaccine (1) 07/15/2022 (Originally 08/10/1981)   INFLUENZA VACCINE  11/01/2022 (Originally 03/03/2022)   Hepatitis C Screening  Completed   HIV Screening  Completed   HPV VACCINES  Aged Out   She reports discontinuing psychiatric visits due to financial constraints, with her last visit in May/2023. She is still taking her Trazodone 50 mg and Lithium 300 mg (total 900 mg daily, 300 mg in the morning and 600 mg at night). She is planning on resuming visits once her health insurance issues are solved. Her psychiatrist continues filling her medications.   She is also taking amlodipine 10 mg and hydrochlorothiazide 25 mg daily for hypertension.  She is not checking BP regularly. Taking levothyroxine 112 mcg daily for hypothyroidism. Last TSH 5.6 in 12/2021.  RLS: She is currently taking Requip 2 mg daily at bedtime but feels that she may need to  increase the dosage due to persistent symptoms.  She has tried various home remedies, such as stretching, hot showers, and applying biofreeze or vapor rub to her knees.   Review of Systems  Constitutional:  Negative for activity change, appetite change and fever.  HENT:  Negative for mouth sores, nosebleeds and sore throat.   Eyes:  Negative for redness and visual disturbance.  Respiratory:  Negative for cough, shortness of breath and wheezing.   Cardiovascular:  Negative for chest pain, palpitations and leg swelling.  Gastrointestinal:  Negative for abdominal pain, nausea and vomiting.       Negative for changes in bowel habits.  Endocrine: Negative for cold intolerance, heat intolerance, polydipsia, polyphagia and polyuria.  Genitourinary:  Negative for decreased urine volume, dysuria and hematuria.  Musculoskeletal:  Negative for gait problem and myalgias.  Skin:  Negative for rash.  Allergic/Immunologic: Positive for environmental allergies.  Neurological:  Negative for syncope, weakness and headaches.  Hematological:  Negative for adenopathy. Does not bruise/bleed easily.  Psychiatric/Behavioral:  Negative for confusion and hallucinations.   All other systems reviewed and are negative.  Current Outpatient Medications on File Prior to Visit  Medication Sig Dispense Refill   amLODipine (NORVASC) 10 MG tablet Take 1 tablet (10 mg total) by mouth daily. 90 tablet 2   hydrochlorothiazide (HYDRODIURIL) 25 MG tablet TAKE 1 TABLET (25 MG TOTAL) BY MOUTH DAILY. 90 tablet 0   levothyroxine (SYNTHROID) 112 MCG tablet TAKE 1 TABLET BY MOUTH EVERY DAY 90 tablet 0   lithium carbonate 300 MG capsule 300 mg. 300 mg in am / 600 mg in pm  OVER THE COUNTER MEDICATION Take 1 tablet by mouth daily as needed. Sleep aid     traZODone (DESYREL) 50 MG tablet TAKE 1 TABLET BY MOUTH AT BEDTIME AS NEEDED FOR SLEEP. 90 tablet 1   No current facility-administered medications on file prior to visit.   Past  Medical History:  Diagnosis Date   Anemia    Anxiety    postpartum   Depression    post partum   Gestational diabetes    diet controlled   Headache    "major issue in the past 4 years"   Hypercalcemia    Hyperlipidemia    Hypertension    not on high blood pressure meds anymore   Hypothyroidism    PE (pulmonary embolism) 2004   Postpartum care following vaginal delivery (11/15) 06/18/2014   Thyroid disease    hypothyroidism   Vitamin D deficiency    Past Surgical History:  Procedure Laterality Date   CHOLECYSTECTOMY N/A 06/12/2016   Procedure: LAPAROSCOPIC CHOLECYSTECTOMY;  Surgeon: Jennifer Filler, MD;  Location: [facility] OR;  Service: General;  Laterality: N/A;   EYE SURGERY     lasik   WISDOM TOOTH EXTRACTION     Allergies  Allergen Reactions   Ramipril Cough   Family History  Problem Relation Age of Onset   Cancer Mother        uterine and ovarian   Hypertension Mother    Varicose Veins Mother    Thyroid disease Mother    Hyperlipidemia Mother    Mental illness Mother    Tuberculosis Sister    Heart disease Maternal Grandmother    Varicose Veins Maternal Grandfather    Mental illness Maternal Grandfather    Cancer Paternal Grandmother    Bipolar disorder Paternal Grandfather    Sleep apnea Neg Hx    Social History   Socioeconomic History   Marital status: Married    Spouse name: Jennifer Mcmahon   Number of children: 2   Years of education: 18   Highest education level: Master's degree (e.g., MA, MS, MEng, MEd, MSW, MBA)  Occupational History   Occupation: Careers adviser: [employer]  Tobacco Use   Smoking status: Some Days    Types: Cigarettes    Last attempt to quit: 02/25/2008    Years since quitting: 14.3   Smokeless tobacco: Never   Tobacco comments:    Has stopped and started smoking for years. Some days no smoking, other days chain smoking  Vaping Use   Vaping Use: Never used  Substance and Sexual Activity   Alcohol use: Yes    Comment:  social   Drug use: No   Sexual activity: Yes    Birth control/protection: Condom  Other Topics Concern   Not on file  Social History Narrative   Patient is right-handed. She lives with her husband and two children in a one level home. She drinks multiple cups of caffeine daily (sources: coffee, soda, monster energy drink, tea). She walks her dog daily.   Social Determinants of Health   Financial Resource Strain: Not on file  Food Insecurity: Not on file  Transportation Needs: Not on file  Physical Activity: Not on file  Stress: Not on file  Social Connections: Not on file   Vitals:   06/29/22 0929  BP: 128/80  Pulse: 99  Resp: 12  Temp: 98.9 F (37.2 C)  SpO2: 98%  Body mass index is 34.92 kg/m. Wt Readings from Last 3 Encounters:  06/29/22 216 lb  6 oz (98.1 kg)  02/12/22 221 lb (100.2 kg)  11/03/21 224 lb 6 oz (101.8 kg)   Physical Exam Vitals and nursing note reviewed.  Constitutional:      General: She is not in acute distress.    Appearance: She is well-developed.  HENT:     Head: Normocephalic and atraumatic.     Right Ear: Hearing, tympanic membrane, ear canal and external ear normal.     Left Ear: Hearing, tympanic membrane, ear canal and external ear normal.     Mouth/Throat:     Mouth: Mucous membranes are moist.     Pharynx: Oropharynx is clear. Uvula midline.  Eyes:     Extraocular Movements: Extraocular movements intact.     Conjunctiva/sclera: Conjunctivae normal.     Pupils: Pupils are equal, round, and reactive to light.  Neck:     Thyroid: Thyromegaly present.     Trachea: No tracheal deviation.  Cardiovascular:     Rate and Rhythm: Normal rate and regular rhythm.     Pulses:          Dorsalis pedis pulses are 2+ on the right side and 2+ on the left side.     Heart sounds: No murmur heard. Pulmonary:     Effort: Pulmonary effort is normal. No respiratory distress.     Breath sounds: Normal breath sounds.  Abdominal:     Palpations: Abdomen  is soft. There is no hepatomegaly or mass.     Tenderness: There is no abdominal tenderness.  Genitourinary:    Comments: Deferred to gyn. Musculoskeletal:     Comments: No major deformity or signs of synovitis appreciated.  Lymphadenopathy:     Cervical: No cervical adenopathy.     Upper Body:     Right upper body: No supraclavicular adenopathy.     Left upper body: No supraclavicular adenopathy.  Skin:    General: Skin is warm.     Findings: No erythema or rash.  Neurological:     General: No focal deficit present.     Mental Status: She is alert and oriented to person, place, and time.     Cranial Nerves: No cranial nerve deficit.     Coordination: Coordination normal.     Gait: Gait normal.     Deep Tendon Reflexes:     Reflex Scores:      Bicep reflexes are 2+ on the right side and 2+ on the left side.      Patellar reflexes are 2+ on the right side and 2+ on the left side. Psychiatric:        Mood and Affect: Mood and affect normal.     ASSESSMENT AND PLAN: Ms. Natavia Sublette was here today annual physical examination.  Orders Placed This Encounter  Procedures   Comprehensive metabolic panel   TSH   Lipid panel    Dustie was seen today for annual exam.  Diagnoses and all orders for this visit:  Routine general medical examination at a health care facility  Hyperlipidemia, unspecified hyperlipidemia type -     Comprehensive metabolic panel; Future -     Lipid panel; Future -     Lipid panel -     Comprehensive metabolic panel  Hypothyroidism, unspecified type -     TSH; Future -     TSH  Primary hypertension  RLS (restless legs syndrome) -     ropinirole (REQUIP) 5 MG tablet; Take 1 tablet (5 mg total) by mouth at  bedtime.   Routine general medical examination at a health care facility Assessment & Plan: We discussed the importance of regular physical activity and healthy diet for prevention of chronic illness and/or complications. Preventive  guidelines reviewed. Vaccination up to date. Continue her female preventive care with her gyn, she is overdue for mammogram. Next CPE in a year.   Hyperlipidemia, unspecified hyperlipidemia type Assessment & Plan: Non pharmacologic treatment recommended for now. Further recommendations will be given according to 10 years CVD risk score and lipid panel numbers.  Orders: -     Comprehensive metabolic panel; Future -     Lipid panel; Future  Hypothyroidism, unspecified type Assessment & Plan: Last TSH mildly abnormal. For now continue levothyroxine same dose. Further recommendation will be given according to TSH result.  Orders: -     TSH; Future  Primary hypertension Assessment & Plan: BP adequately controlled. Continue current management: Amlodipine and HCTZ same dose. DASH/low salt diet to continue. Monitor BP at home. As far as BP is stable we can continue annual follow ups.   RLS (restless legs syndrome) Assessment & Plan: Problem has improved but is still not well controlled. She is not interested in trying gabapentin. She can increase her Requip from 2 mg to 2.5 and titrate up to 4 mg and 5 mg if needed.  Orders: -     rOPINIRole HCl; Take 1 tablet (5 mg total) by mouth at bedtime.  Dispense: 90 tablet; Refill: 2  Return in 1 year (on 06/30/2023) for CPE, chronic problems.  Kimberely Mccannon G. [place], MD  [facility]. [place] office.

## 2022-06-29 NOTE — Patient Instructions (Addendum)
A few things to remember from today's visit:  Routine general medical examination at a health care facility  Hyperlipidemia, unspecified hyperlipidemia type - Plan: Comprehensive metabolic panel, Lipid panel  Hypothyroidism, unspecified type - Plan: TSH  Primary hypertension  RLS (restless legs syndrome) - Plan: ropinirole (REQUIP) 5 MG tablet  Screening for endocrine, metabolic and immunity disorder  I can continue seeing you annually, before if needed. Monitor blood pressure at home.  If you need refills for medications you take chronically, please call your pharmacy. Do not use My Chart to request refills or for acute issues that need immediate attention. If you send a my chart message, it may take a few days to be addressed, specially if I am not in the office.  Please be sure medication list is accurate. If a new problem present, please set up appointment sooner than planned today.  Health Maintenance, Female Adopting a healthy lifestyle and getting preventive care are important in promoting health and wellness. Ask your health care provider about: The right schedule for you to have regular tests and exams. Things you can do on your own to prevent diseases and keep yourself healthy. What should I know about diet, weight, and exercise? Eat a healthy diet  Eat a diet that includes plenty of vegetables, fruits, low-fat dairy products, and lean protein. Do not eat a lot of foods that are high in solid fats, added sugars, or sodium. Maintain a healthy weight Body mass index (BMI) is used to identify weight problems. It estimates body fat based on height and weight. Your health care provider can help determine your BMI and help you achieve or maintain a healthy weight. Get regular exercise Get regular exercise. This is one of the most important things you can do for your health. Most adults should: Exercise for at least 150 minutes each week. The exercise should increase your  heart rate and make you sweat (moderate-intensity exercise). Do strengthening exercises at least twice a week. This is in addition to the moderate-intensity exercise. Spend less time sitting. Even light physical activity can be beneficial. Watch cholesterol and blood lipids Have your blood tested for lipids and cholesterol at 43 years of age, then have this test every 5 years. Have your cholesterol levels checked more often if: Your lipid or cholesterol levels are high. You are older than 43 years of age. You are at high risk for heart disease. What should I know about cancer screening? Depending on your health history and family history, you may need to have cancer screening at various ages. This may include screening for: Breast cancer. Cervical cancer. Colorectal cancer. Skin cancer. Lung cancer. What should I know about heart disease, diabetes, and high blood pressure? Blood pressure and heart disease High blood pressure causes heart disease and increases the risk of stroke. This is more likely to develop in people who have high blood pressure readings or are overweight. Have your blood pressure checked: Every 3-5 years if you are 4-43 years of age. Every year if you are 63 years old or older. Diabetes Have regular diabetes screenings. This checks your fasting blood sugar level. Have the screening done: Once every three years after age 22 if you are at a normal weight and have a low risk for diabetes. More often and at a younger age if you are overweight or have a high risk for diabetes. What should I know about preventing infection? Hepatitis B If you have a higher risk for hepatitis B, you  should be screened for this virus. Talk with your health care provider to find out if you are at risk for hepatitis B infection. Hepatitis C Testing is recommended for: Everyone born from [birth year] through 1967. Anyone with known risk factors for hepatitis C. Sexually transmitted infections  (STIs) Get screened for STIs, including gonorrhea and chlamydia, if: You are sexually active and are younger than 43 years of age. You are older than 43 years of age and your health care provider tells you that you are at risk for this type of infection. Your sexual activity has changed since you were last screened, and you are at increased risk for chlamydia or gonorrhea. Ask your health care provider if you are at risk. Ask your health care provider about whether you are at high risk for HIV. Your health care provider may recommend a prescription medicine to help prevent HIV infection. If you choose to take medicine to prevent HIV, you should first get tested for HIV. You should then be tested every 3 months for as long as you are taking the medicine. Pregnancy If you are about to stop having your period (premenopausal) and you may become pregnant, seek counseling before you get pregnant. Take 400 to 800 micrograms (mcg) of folic acid every day if you become pregnant. Ask for birth control (contraception) if you want to prevent pregnancy. Osteoporosis and menopause Osteoporosis is a disease in which the bones lose minerals and strength with aging. This can result in bone fractures. If you are 56 years old or older, or if you are at risk for osteoporosis and fractures, ask your health care provider if you should: Be screened for bone loss. Take a calcium or vitamin D supplement to lower your risk of fractures. Be given hormone replacement therapy (HRT) to treat symptoms of menopause. Follow these instructions at home: Alcohol use Do not drink alcohol if: Your health care provider tells you not to drink. You are pregnant, may be pregnant, or are planning to become pregnant. If you drink alcohol: Limit how much you have to: 0-1 drink a day. Know how much alcohol is in your drink. In the [place] one drink equals one 12 oz bottle of beer (355 mL), one 5 oz glass of wine (148 mL), or one 1 oz glass  of hard liquor (44 mL). Lifestyle Do not use any products that contain nicotine or tobacco. These products include cigarettes, chewing tobacco, and vaping devices, such as e-cigarettes. If you need help quitting, ask your health care provider. Do not use street drugs. Do not share needles. Ask your health care provider for help if you need support or information about quitting drugs. General instructions Schedule regular health, dental, and eye exams. Stay current with your vaccines. Tell your health care provider if: You often feel depressed. You have ever been abused or do not feel safe at home. Summary Adopting a healthy lifestyle and getting preventive care are important in promoting health and wellness. Follow your health care provider's instructions about healthy diet, exercising, and getting tested or screened for diseases. Follow your health care provider's instructions on monitoring your cholesterol and blood pressure. This information is not intended to replace advice given to you by your health care provider. Make sure you discuss any questions you have with your health care provider. Document Revised: 12/09/2020 Document Reviewed: 12/09/2020 Elsevier Patient Education  2023 [employer].

## 2022-06-29 NOTE — Assessment & Plan Note (Signed)
Problem has improved but is still not well controlled. She is not interested in trying gabapentin. She can increase her Requip from 2 mg to 2.5 and titrate up to 4 mg and 5 mg if needed.

## 2022-06-29 NOTE — Assessment & Plan Note (Signed)
Last TSH mildly abnormal. For now continue levothyroxine same dose. Further recommendation will be given according to TSH result.

## 2022-06-29 NOTE — Assessment & Plan Note (Signed)
Non pharmacologic treatment recommended for now. Further recommendations will be given according to 10 years CVD risk score and lipid panel numbers. 

## 2022-06-29 NOTE — Assessment & Plan Note (Signed)
BP adequately controlled. Continue current management: Amlodipine and HCTZ same dose. DASH/low salt diet to continue. Monitor BP at home. As far as BP is stable we can continue annual follow ups.

## 2022-06-30 MED ORDER — LEVOTHYROXINE SODIUM 125 MCG PO TABS: 125.0000 ug | ORAL_TABLET | Freq: Every day | ORAL | 3 refills | Status: DC

## 2022-06-30 MED ORDER — HYDROCHLOROTHIAZIDE 25 MG PO TABS: 25.0000 mg | ORAL_TABLET | Freq: Every day | ORAL | 2 refills | Status: DC

## 2022-08-02 ENCOUNTER — Other Ambulatory Visit: Payer: Self-pay | Admitting: Family Medicine

## 2022-08-02 DIAGNOSIS — G4700 Insomnia, unspecified: Secondary | ICD-10-CM

## 2022-08-03 NOTE — Telephone Encounter (Signed)
Last OV 06/29/22 (CPE) Last refill per [facility] 12/06/21 Next OV: none scheduled

## 2022-08-26 ENCOUNTER — Telehealth: Payer: Self-pay

## 2022-08-26 NOTE — Telephone Encounter (Signed)
I spoke with patient. Lab appt scheduled for 1/25 at 9am.

## 2022-08-26 NOTE — Telephone Encounter (Signed)
-----  Message from Rodrigo Ran, Manito sent at 07/03/2022 12:41 PM EST ----- TSH in 8 weeks

## 2022-08-27 ENCOUNTER — Other Ambulatory Visit (INDEPENDENT_AMBULATORY_CARE_PROVIDER_SITE_OTHER): Payer: Federal, State, Local not specified - PPO

## 2022-08-27 DIAGNOSIS — E039 Hypothyroidism, unspecified: Secondary | ICD-10-CM | POA: Diagnosis not present

## 2022-08-27 LAB — TSH: TSH: 6.31 u[IU]/mL — ABNORMAL HIGH (ref 0.35–5.50)

## 2022-08-27 LAB — T4, FREE: Free T4: 0.66 ng/dL (ref 0.60–1.60)

## 2022-08-31 ENCOUNTER — Other Ambulatory Visit: Payer: Self-pay

## 2022-08-31 ENCOUNTER — Other Ambulatory Visit: Payer: Self-pay | Admitting: Family Medicine

## 2022-08-31 DIAGNOSIS — G2581 Restless legs syndrome: Secondary | ICD-10-CM

## 2022-08-31 MED ORDER — SYNTHROID 125 MCG PO TABS: ORAL_TABLET | ORAL | 2 refills | Status: DC

## 2022-08-31 MED ORDER — ROPINIROLE HCL 5 MG PO TABS: 5.0000 mg | ORAL_TABLET | Freq: Every day | ORAL | 2 refills | Status: DC

## 2022-09-01 NOTE — Progress Notes (Unsigned)
ACUTE VISIT No chief complaint on file.  HPI: Jennifer Mcmahon is a 42 y.o. female, who is here today complaining of *** HPI  Review of Systems See other pertinent positives and negatives in HPI.  Current Outpatient Medications on File Prior to Visit  Medication Sig Dispense Refill  . SYNTHROID 125 MCG tablet Take 1.5 tabs Monday, Wednesday,and Fridays. Continue 1 tab the rest of the days. 90 tablet 2  . amLODipine (NORVASC) 10 MG tablet Take 1 tablet (10 mg total) by mouth daily. 90 tablet 2  . hydrochlorothiazide (HYDRODIURIL) 25 MG tablet Take 1 tablet (25 mg total) by mouth daily. 90 tablet 2  . lithium carbonate 300 MG capsule 300 mg. 300 mg in am / 600 mg in pm    . OVER THE COUNTER MEDICATION Take 1 tablet by mouth daily as needed. Sleep aid    . ropinirole (REQUIP) 5 MG tablet Take 1 tablet (5 mg total) by mouth at bedtime. 90 tablet 2  . traZODone (DESYREL) 50 MG tablet TAKE 1 TABLET BY MOUTH EVERY DAY AT BEDTIME AS NEEDED FOR SLEEP 90 tablet 2   No current facility-administered medications on file prior to visit.    Past Medical History:  Diagnosis Date  . Anemia   . Anxiety    postpartum  . Depression    post partum  . Gestational diabetes    diet controlled  . Headache    "major issue in the past 4 years"  . Hypercalcemia   . Hyperlipidemia   . Hypertension    not on high blood pressure meds anymore  . Hypothyroidism   . PE (pulmonary embolism) 2004  . Postpartum care following vaginal delivery (11/15) 06/18/2014  . Thyroid disease    hypothyroidism  . Vitamin D deficiency    Allergies  Allergen Reactions  . Ramipril Cough    Social History   Socioeconomic History  . Marital status: Married    Spouse name: Corene Cornea  . Number of children: 2  . Years of education: 50  . Highest education level: Master's degree (e.g., MA, MS, MEng, MEd, MSW, MBA)  Occupational History  . Occupation: Environmental education officer: [employer]  Tobacco Use  .  Smoking status: Some Days    Types: Cigarettes    Last attempt to quit: 02/25/2008    Years since quitting: 14.5  . Smokeless tobacco: Never  . Tobacco comments:    Has stopped and started smoking for years. Some days no smoking, other days chain smoking  Vaping Use  . Vaping Use: Never used  Substance and Sexual Activity  . Alcohol use: Yes    Comment: social  . Drug use: No  . Sexual activity: Yes    Birth control/protection: Condom  Other Topics Concern  . Not on file  Social History Narrative   Patient is right-handed. She lives with her husband and two children in a one level home. She drinks multiple cups of caffeine daily (sources: coffee, soda, monster energy drink, tea). She walks her dog daily.   Social Determinants of Health   Financial Resource Strain: Not on file  Food Insecurity: Not on file  Transportation Needs: Not on file  Physical Activity: Not on file  Stress: Not on file  Social Connections: Not on file    There were no vitals filed for this visit. There is no height or weight on file to calculate BMI.  Physical Exam  ASSESSMENT AND PLAN: There are no  diagnoses linked to this encounter.  No follow-ups on file.  Kowen Kluth G. [place], MD  [facility]. [address] office.  Discharge Instructions   None

## 2022-09-02 ENCOUNTER — Encounter: Payer: Self-pay | Admitting: Family Medicine

## 2022-09-02 ENCOUNTER — Ambulatory Visit: Payer: Federal, State, Local not specified - PPO | Admitting: Family Medicine

## 2022-09-02 ENCOUNTER — Encounter: Payer: Self-pay | Admitting: Internal Medicine

## 2022-09-02 VITALS — BP 120/70 | HR 95 | Temp 98.6°F | Resp 12 | Ht 66.0 in | Wt 213.0 lb

## 2022-09-02 DIAGNOSIS — R1310 Dysphagia, unspecified: Secondary | ICD-10-CM | POA: Diagnosis not present

## 2022-09-02 DIAGNOSIS — R1032 Left lower quadrant pain: Secondary | ICD-10-CM

## 2022-09-02 DIAGNOSIS — E039 Hypothyroidism, unspecified: Secondary | ICD-10-CM

## 2022-09-02 DIAGNOSIS — K219 Gastro-esophageal reflux disease without esophagitis: Secondary | ICD-10-CM | POA: Diagnosis not present

## 2022-09-02 MED ORDER — PANTOPRAZOLE SODIUM 40 MG PO TBEC: 40.0000 mg | DELAYED_RELEASE_TABLET | Freq: Every day | ORAL | 3 refills | Status: DC

## 2022-09-02 NOTE — Assessment & Plan Note (Signed)
It seems to be a chronic problem and getting worse. We discussed possible etiologies, GERD,esophagitis among some to consider.She is concerned about motility issues. Options are swallowing study or GI evaluation to discuss the need for further work up, she prefers the latter one. Protonix 40 mg started today. Other general precautions discussed including eating slower and chewing well.

## 2022-09-02 NOTE — Assessment & Plan Note (Signed)
She has increased Levothyroxine dose. Has appt for TSH.

## 2022-09-02 NOTE — Patient Instructions (Addendum)
A few things to remember from today's visit:  Dysphagia, unspecified type - Plan: Ambulatory referral to Gastroenterology  Gastroesophageal reflux disease, unspecified whether esophagitis present - Plan: pantoprazole (PROTONIX) 40 MG tablet, Ambulatory referral to Gastroenterology  Hypothyroidism, unspecified type  Try Protonix 40 mg with empty stomach. Appt with gastroenterologist will be arranged.  If you need refills for medications you take chronically, please call your pharmacy. Do not use My Chart to request refills or for acute issues that need immediate attention. If you send a my chart message, it may take a few days to be addressed, specially if I am not in the office.  Please be sure medication list is accurate. If a new problem present, please set up appointment sooner than planned today.

## 2022-09-02 NOTE — Assessment & Plan Note (Signed)
Protonix 40 mg daily with empty stomach. GERD precautions recommended.

## 2022-09-03 ENCOUNTER — Encounter: Payer: Self-pay | Admitting: Family Medicine

## 2022-09-26 ENCOUNTER — Other Ambulatory Visit: Payer: Self-pay | Admitting: Family Medicine

## 2022-09-26 DIAGNOSIS — K219 Gastro-esophageal reflux disease without esophagitis: Secondary | ICD-10-CM

## 2022-09-28 ENCOUNTER — Encounter: Payer: Self-pay | Admitting: Internal Medicine

## 2022-09-28 ENCOUNTER — Ambulatory Visit: Payer: Federal, State, Local not specified - PPO | Admitting: Internal Medicine

## 2022-09-28 VITALS — BP 168/142 | HR 94 | Ht 66.0 in | Wt 215.0 lb

## 2022-09-28 DIAGNOSIS — R1310 Dysphagia, unspecified: Secondary | ICD-10-CM

## 2022-09-28 DIAGNOSIS — R140 Abdominal distension (gaseous): Secondary | ICD-10-CM

## 2022-09-28 DIAGNOSIS — K219 Gastro-esophageal reflux disease without esophagitis: Secondary | ICD-10-CM | POA: Diagnosis not present

## 2022-09-28 DIAGNOSIS — I10 Essential (primary) hypertension: Secondary | ICD-10-CM | POA: Diagnosis not present

## 2022-09-28 NOTE — Patient Instructions (Signed)
_______________________________________________________  If your blood pressure at your visit was 140/90 or greater, please contact your primary care physician to follow up on this.  _______________________________________________________  If you are age 44 or older, your body mass index should be between 23-30. Your Body mass index is 34.7 kg/m. If this is out of the aforementioned range listed, please consider follow up with your Primary Care Provider.  If you are age 37 or younger, your body mass index should be between 19-25. Your Body mass index is 34.7 kg/m. If this is out of the aformentioned range listed, please consider follow up with your Primary Care Provider.   ________________________________________________________  The [place] [facility] providers would like to encourage you to use [facility] to communicate with providers for non-urgent requests or questions.  Due to long hold times on the telephone, sending your provider a message by [facility] may be a faster and more efficient way to get a response.  Please allow 48 business hours for a response.  Please remember that this is for non-urgent requests.  _______________________________________________________  Jennifer Mcmahon have been scheduled for an endoscopy. Please follow written instructions given to you at your visit today. If you use inhalers (even only as needed), please bring them with you on the day of your procedure.

## 2022-09-28 NOTE — Progress Notes (Signed)
HISTORY OF PRESENT ILLNESS:  Jennifer Mcmahon is a 42 y.o. female who was referred by her primary care provider regarding acid reflux and dysphagia.  She is new to this practice.  No prior GI evaluations.  Patient reports a 1 year history of postprandial cramping discomfort in her chest after meals.  This may last anywhere from 15 minutes to a few hours.  Over time there has been increased frequency of symptoms and at one point was occurring with each meal.  This worsened around Thanksgiving time.  She is status post remote cholecystectomy and does not feel the symptoms are similar.  She was seen by her PCP and placed on pantoprazole 40 mg daily.  She has been compliant.  This may be helping somewhat.  She does describe some intermittent solid food dysphagia.  She also describes globus type sensation and difficulty initiating swallowing of liquids at times.  She does have classic reflux symptoms.  There is some bloating.  Weight has been stable.  She smokes.  GI review of systems is otherwise negative.  Review of blood work from June 29, 2022 shows normal comprehensive metabolic panel.  Last CBC May 2023 with normal hemoglobin of 14.0.  She does have elevated lipids.  Most recent TSH 6.31.  Abdominal ultrasound from 2017 revealed gallstones.  Subsequent cholecystectomy is noted.  REVIEW OF SYSTEMS:  All non-GI ROS negative as otherwise stated in the HPI except for sinus allergy trouble, anxiety, arthritis, depression, fatigue, headaches, menstrual cramps, sleeping problems, swollen lymph glands, excessive urination, urinary leakage  Past Medical History:  Diagnosis Date   Anemia    Anxiety    postpartum   Depression    post partum   Gestational diabetes    diet controlled   Headache    "major issue in the past 4 years"   Hypercalcemia    Hyperlipidemia    Hypertension    not on high blood pressure meds anymore   Hypothyroidism    PE (pulmonary embolism) 2004   Postpartum care  following vaginal delivery (11/15) 06/18/2014   Thyroid disease    hypothyroidism   Vitamin D deficiency     Past Surgical History:  Procedure Laterality Date   CHOLECYSTECTOMY N/A 06/12/2016   Procedure: LAPAROSCOPIC CHOLECYSTECTOMY;  Surgeon: Ralene Ok, MD;  Location: [place];  Service: General;  Laterality: N/A;   EYE SURGERY     lasik   WISDOM TOOTH EXTRACTION      Social History Jennifer Mcmahon  reports that she has been smoking cigarettes. She has never used smokeless tobacco. She reports current alcohol use. She reports that she does not use drugs.  family history includes Bipolar disorder in her paternal grandfather; Cancer in her mother and paternal grandmother; Heart disease in her maternal grandmother; Hyperlipidemia in her mother; Hypertension in her mother; Mental illness in her maternal grandfather and mother; Thyroid disease in her mother; Tuberculosis in her sister; Varicose Veins in her maternal grandfather and mother.  Allergies  Allergen Reactions   Ramipril Cough       PHYSICAL EXAMINATION: Vital signs: BP (!) 168/142   Pulse 94   Ht '5\' 6"'$  (1.676 m)   Wt 215 lb (97.5 kg)   LMP 08/17/2022   SpO2 96%   BMI 34.70 kg/m   Constitutional: generally well-appearing, no acute distress Psychiatric: alert and oriented x3, cooperative Eyes: extraocular movements intact, anicteric, conjunctiva pink Mouth: oral pharynx moist, no lesions Neck: supple no lymphadenopathy Cardiovascular: heart regular rate and  rhythm, no murmur Lungs: clear to auscultation bilaterally Abdomen: soft, nontender, nondistended, no obvious ascites, no peritoneal signs, normal bowel sounds, no organomegaly Rectal: Omitted Extremities: no clubbing, cyanosis, or lower extremity edema bilaterally Skin: no lesions on visible extremities Neuro: No focal deficits.  Cranial nerves intact  ASSESSMENT:  1.  GERD 2.  Globus sensation 3.  Intermittent solid food dysphagia.  Also, atypical  dysphagia 4.  Bloating 5.  Hypertension.  Patient's blood pressure is elevated today.  She tells me that she has a history of hypertension and had been on blood pressure medicine in the past.  She does have a blood pressure cuff at home.  She has been advised to check her blood pressure several times per day and record.  If consistently elevated, let her PCP know ASAP.  She agrees   PLAN:  1.  Reflux precautions including weight loss and tobacco cessation 2.  Continue pantoprazole 40 mg daily 3.  Schedule upper endoscopy with possible esophageal dilation.The nature of the procedure, as well as the risks, benefits, and alternatives were carefully and thoroughly reviewed with the patient. Ample time for discussion and questions allowed. The patient understood, was satisfied, and agreed to proceed. 4.  Additional recommendations and follow-up to be determined thereafter A total time of 45 minutes was spent preparing to see the patient, reviewing outside records, laboratories and x-rays.  Obtaining comprehensive history, performing medically appropriate physical examination, counseling and educating the patient regarding the above listed issues, ordering therapeutic endoscopy, and documenting clinical information in the health record

## 2022-09-29 ENCOUNTER — Other Ambulatory Visit: Payer: Self-pay | Admitting: Family Medicine

## 2022-09-30 ENCOUNTER — Other Ambulatory Visit: Payer: Self-pay | Admitting: Family Medicine

## 2022-09-30 ENCOUNTER — Ambulatory Visit (AMBULATORY_SURGERY_CENTER): Payer: Federal, State, Local not specified - PPO | Admitting: Internal Medicine

## 2022-09-30 ENCOUNTER — Encounter: Payer: Self-pay | Admitting: Internal Medicine

## 2022-09-30 VITALS — BP 158/88 | HR 68 | Temp 97.8°F | Resp 15 | Ht 66.0 in | Wt 215.0 lb

## 2022-09-30 DIAGNOSIS — K222 Esophageal obstruction: Secondary | ICD-10-CM | POA: Diagnosis not present

## 2022-09-30 DIAGNOSIS — Z1231 Encounter for screening mammogram for malignant neoplasm of breast: Secondary | ICD-10-CM

## 2022-09-30 DIAGNOSIS — K219 Gastro-esophageal reflux disease without esophagitis: Secondary | ICD-10-CM

## 2022-09-30 DIAGNOSIS — R1310 Dysphagia, unspecified: Secondary | ICD-10-CM

## 2022-09-30 DIAGNOSIS — R140 Abdominal distension (gaseous): Secondary | ICD-10-CM

## 2022-09-30 DIAGNOSIS — K259 Gastric ulcer, unspecified as acute or chronic, without hemorrhage or perforation: Secondary | ICD-10-CM

## 2022-09-30 DIAGNOSIS — K209 Esophagitis, unspecified without bleeding: Secondary | ICD-10-CM

## 2022-09-30 DIAGNOSIS — K319 Disease of stomach and duodenum, unspecified: Secondary | ICD-10-CM | POA: Diagnosis not present

## 2022-09-30 MED ORDER — SODIUM CHLORIDE 0.9 % IV SOLN: 500.0000 mL | Freq: Once | INTRAVENOUS | Status: DC

## 2022-09-30 MED ORDER — LITHIUM CARBONATE 300 MG PO CAPS: ORAL_CAPSULE | ORAL | 0 refills | Status: DC

## 2022-09-30 NOTE — Op Note (Signed)
[place] Patient Name: Jennifer Mcmahon Procedure Date: 09/30/2022 9:21 AM MRN: [id number] Endoscopist: Docia Chuck. Henrene Pastor , MD, [id number] Age: 42 Referring MD:  Date of Birth: 29-Oct-1980 Gender: Female Account #: [id number] Procedure:                Upper GI endoscopy with biopsies; Maloney dilation                            of the esophagus. 50 French Indications:              Dysphagia, Esophageal reflux Medicines:                Monitored Anesthesia Care Procedure:                Pre-Anesthesia Assessment:                           - Prior to the procedure, a History and Physical                            was performed, and patient medications and                            allergies were reviewed. The patient's tolerance of                            previous anesthesia was also reviewed. The risks                            and benefits of the procedure and the sedation                            options and risks were discussed with the patient.                            All questions were answered, and informed consent                            was obtained. Prior Anticoagulants: The patient has                            taken no anticoagulant or antiplatelet agents. ASA                            Grade Assessment: II - A patient with mild systemic                            disease. After reviewing the risks and benefits,                            the patient was deemed in satisfactory condition to                            undergo the procedure.  After obtaining informed consent, the endoscope was                            passed under direct vision. Throughout the                            procedure, the patient's blood pressure, pulse, and                            oxygen saturations were monitored continuously. The                            Olympus Scope [id number] was introduced through the                            mouth, and  advanced to the second part of duodenum.                            The upper GI endoscopy was accomplished without                            difficulty. The patient tolerated the procedure                            well. Scope In: Scope Out: Findings:                 One benign-appearing, intrinsic mild stenosis was                            found 35 cm from the incisors. This stenosis                            measured 1.5 cm (inner diameter). After completing                            the endoscopic survey the scope was withdrawn.                            Dilation was performed with a Maloney dilator with                            no resistance at 52 Fr.                           The exam of the esophagus revealed mild                            inflammation at the gastroesophageal junction. The                            entire esophagus was otherwise normal.                           The stomach revealed superficial gastric erosions  in the antrum. Small hiatal hernia. Biopsies were                            taken from the gastric antrum with a cold forceps                            for histology.                           The examined duodenum was normal.                           The cardia and gastric fundus were normal on                            retroflexion. Complications:            No immediate complications. Estimated Blood Loss:     Estimated blood loss: none. Impression:               - Benign-appearing esophageal stenosis. Dilated.                           - Mild reflux esophagitis                           - A few superficial gastric erosions. Biopsied.                           - Normal examined duodenum. Recommendation:           - Patient has a contact number available for                            emergencies. The signs and symptoms of potential                            delayed complications were discussed with the                             patient. Return to normal activities tomorrow.                            Written discharge instructions were provided to the                            patient.                           - Post dilation diet.                           - Reflux precautions with attention to cessation of                            smoking and weight reduction. These measures are  helpful in controlling acid reflux disease                           - Continue present medications. Continue                            pantoprazole daily until follow-up                           - Await pathology results.                           - Office follow-up with Dr. Henrene Pastor in 4 to 6 weeks Docia Chuck. Henrene Pastor, MD 09/30/2022 9:43:39 AM This report has been signed electronically.

## 2022-09-30 NOTE — Patient Instructions (Addendum)
PLAN:   1.  Reflux precautions including weight loss and tobacco cessation 2.  Continue pantoprazole 40 mg daily 3.  Schedule upper endoscopy with possible esophageal dilation.The nature of the procedure, as well as the risks, benefits, and alternatives were carefully and thoroughly reviewed with the patient. Ample time for discussion and questions allowed. The patient understood, was satisfied, and agreed to proceed. 4.  Additional recommendations and follow-up to be determined thereafter  YOU HAD AN ENDOSCOPIC PROCEDURE TODAY AT [place]:   Refer to the procedure report that was given to you for any specific questions about what was found during the examination.  If the procedure report does not answer your questions, please call your gastroenterologist to clarify.  If you requested that your care partner not be given the details of your procedure findings, then the procedure report has been included in a sealed envelope for you to review at your convenience later.  YOU SHOULD EXPECT: Some feelings of bloating in the abdomen. Passage of more gas than usual.  Walking can help get rid of the air that was put into your GI tract during the procedure and reduce the bloating. If you had a lower endoscopy (such as a colonoscopy or flexible sigmoidoscopy) you may notice spotting of blood in your stool or on the toilet paper. If you underwent a bowel prep for your procedure, you may not have a normal bowel movement for a few days.  Please Note:  You might notice some irritation and congestion in your nose or some drainage.  This is from the oxygen used during your procedure.  There is no need for concern and it should clear up in a day or so.  SYMPTOMS TO REPORT IMMEDIATELY: Following upper endoscopy (EGD)  Vomiting of blood or coffee ground material  New chest pain or pain under the shoulder blades  Painful or persistently difficult swallowing  New shortness of breath  Fever of 100F  or higher  Black, tarry-looking stools  For urgent or emergent issues, a gastroenterologist can be reached at any hour by calling [phone]. Do not use MyChart messaging for urgent concerns.    DIET:  We do recommend a small meal at first, but then you may proceed to your regular diet.  Drink plenty of fluids but you should avoid alcoholic beverages for 24 hours.  ACTIVITY:  You should plan to take it easy for the rest of today and you should NOT DRIVE or use heavy machinery until tomorrow (because of the sedation medicines used during the test).    FOLLOW UP: Our staff will call the number listed on your records the next business day following your procedure.  We will call around 7:15- 8:00 am to check on you and address any questions or concerns that you may have regarding the information given to you following your procedure. If we do not reach you, we will leave a message.     If any biopsies were taken you will be contacted by phone or by letter within the next 1-3 weeks.  Please call us at [phone] if you have not heard about the biopsies in 3 weeks.    SIGNATURES/CONFIDENTIALITY: You and/or your care partner have signed paperwork which will be entered into your electronic medical record.  These signatures attest to the fact that that the information above on your After Visit Summary has been reviewed and is understood.  Full responsibility of the confidentiality of this discharge information lies  with you and/or your care-partner.

## 2022-09-30 NOTE — Progress Notes (Signed)
Sedate, gd SR, tolerated procedure well, VSS, report to RN 

## 2022-09-30 NOTE — Progress Notes (Signed)
HISTORY OF PRESENT ILLNESS:   Jennifer Mcmahon is a 42 y.o. female who was referred by her primary care provider regarding acid reflux and dysphagia.  She is new to this practice.  No prior GI evaluations.   Patient reports a 1 year history of postprandial cramping discomfort in her chest after meals.  This may last anywhere from 15 minutes to a few hours.  Over time there has been increased frequency of symptoms and at one point was occurring with each meal.  This worsened around Thanksgiving time.  She is status post remote cholecystectomy and does not feel the symptoms are similar.  She was seen by her PCP and placed on pantoprazole 40 mg daily.  She has been compliant.  This may be helping somewhat.  She does describe some intermittent solid food dysphagia.  She also describes globus type sensation and difficulty initiating swallowing of liquids at times.  She does have classic reflux symptoms.  There is some bloating.  Weight has been stable.  She smokes.  GI review of systems is otherwise negative.   Review of blood work from June 29, 2022 shows normal comprehensive metabolic panel.  Last CBC May 2023 with normal hemoglobin of 14.0.  She does have elevated lipids.  Most recent TSH 6.31.  Abdominal ultrasound from 2017 revealed gallstones.  Subsequent cholecystectomy is noted.   REVIEW OF SYSTEMS:   All non-GI ROS negative as otherwise stated in the HPI except for sinus allergy trouble, anxiety, arthritis, depression, fatigue, headaches, menstrual cramps, sleeping problems, swollen lymph glands, excessive urination, urinary leakage       Past Medical History:  Diagnosis Date   Anemia     Anxiety      postpartum   Depression      post partum   Gestational diabetes      diet controlled   Headache      "major issue in the past 4 years"   Hypercalcemia     Hyperlipidemia     Hypertension      not on high blood pressure meds anymore   Hypothyroidism     PE (pulmonary embolism) 2004    Postpartum care following vaginal delivery (11/15) 06/18/2014   Thyroid disease      hypothyroidism   Vitamin D deficiency             Past Surgical History:  Procedure Laterality Date   CHOLECYSTECTOMY N/A 06/12/2016    Procedure: LAPAROSCOPIC CHOLECYSTECTOMY;  Surgeon: Ralene Ok, MD;  Location: [place];  Service: General;  Laterality: N/A;   EYE SURGERY        lasik   WISDOM TOOTH EXTRACTION          Social History Jennifer Mcmahon  reports that she has been smoking cigarettes. She has never used smokeless tobacco. She reports current alcohol use. She reports that she does not use drugs.   family history includes Bipolar disorder in her paternal grandfather; Cancer in her mother and paternal grandmother; Heart disease in her maternal grandmother; Hyperlipidemia in her mother; Hypertension in her mother; Mental illness in her maternal grandfather and mother; Thyroid disease in her mother; Tuberculosis in her sister; Varicose Veins in her maternal grandfather and mother.       Allergies  Allergen Reactions   Ramipril Cough          PHYSICAL EXAMINATION: Vital signs: BP (!) 168/142   Pulse 94   Ht '5\' 6"'$  (1.676 m)   Wt 215 lb (  97.5 kg)   LMP 08/17/2022   SpO2 96%   BMI 34.70 kg/m   Constitutional: generally well-appearing, no acute distress Psychiatric: alert and oriented x3, cooperative Eyes: extraocular movements intact, anicteric, conjunctiva pink Mouth: oral pharynx moist, no lesions Neck: supple no lymphadenopathy Cardiovascular: heart regular rate and rhythm, no murmur Lungs: clear to auscultation bilaterally Abdomen: soft, nontender, nondistended, no obvious ascites, no peritoneal signs, normal bowel sounds, no organomegaly Rectal: Omitted Extremities: no clubbing, cyanosis, or lower extremity edema bilaterally Skin: no lesions on visible extremities Neuro: No focal deficits.  Cranial nerves intact   ASSESSMENT:   1.  GERD 2.  Globus sensation 3.   Intermittent solid food dysphagia.  Also, atypical dysphagia 4.  Bloating 5.  Hypertension.  Patient's blood pressure is elevated today.  She tells me that she has a history of hypertension and had been on blood pressure medicine in the past.  She does have a blood pressure cuff at home.  She has been advised to check her blood pressure several times per day and record.  If consistently elevated, let her PCP know ASAP.  She agrees     PLAN:   1.  Reflux precautions including weight loss and tobacco cessation 2.  Continue pantoprazole 40 mg daily 3.  Schedule upper endoscopy with possible esophageal dilation.The nature of the procedure, as well as the risks, benefits, and alternatives were carefully and thoroughly reviewed with the patient. Ample time for discussion and questions allowed. The patient understood, was satisfied, and agreed to proceed. 4.  Additional recommendations and follow-up to be determined thereafter

## 2022-09-30 NOTE — Progress Notes (Signed)
Pt's states no medical or surgical changes since previsit or office visit. 

## 2022-09-30 NOTE — Progress Notes (Signed)
Called to room to assist during endoscopic procedure.  Patient ID and intended procedure confirmed with present staff. Received instructions for my participation in the procedure from the performing physician.  

## 2022-10-01 ENCOUNTER — Telehealth: Payer: Self-pay

## 2022-10-01 NOTE — Telephone Encounter (Signed)
  Follow up Call-     09/30/2022    8:11 AM  Call back number  Post procedure Call Back phone  # [phone]  Permission to leave phone message Yes     Patient questions:  Do you have a fever, pain , or abdominal swelling? No. Pain Score  0 *  Have you tolerated food without any problems? Yes.    Have you been able to return to your normal activities? Yes.    Do you have any questions about your discharge instructions: Diet   No. Medications  No. Follow up visit  No.  Do you have questions or concerns about your Care? No.  Actions: * If pain score is 4 or above: No action needed, pain <4.

## 2022-10-05 ENCOUNTER — Encounter: Payer: Self-pay | Admitting: Internal Medicine

## 2022-10-20 DIAGNOSIS — Z1231 Encounter for screening mammogram for malignant neoplasm of breast: Secondary | ICD-10-CM | POA: Diagnosis not present

## 2022-10-20 LAB — HM MAMMOGRAPHY

## 2022-10-23 DIAGNOSIS — R928 Other abnormal and inconclusive findings on diagnostic imaging of breast: Secondary | ICD-10-CM | POA: Diagnosis not present

## 2022-10-23 DIAGNOSIS — R922 Inconclusive mammogram: Secondary | ICD-10-CM | POA: Diagnosis not present

## 2022-10-26 ENCOUNTER — Telehealth: Payer: Self-pay

## 2022-10-26 DIAGNOSIS — E039 Hypothyroidism, unspecified: Secondary | ICD-10-CM

## 2022-10-26 NOTE — Telephone Encounter (Signed)
I left pt a voicemail to call back. Needs to schedule lab appt for TSH recheck. Order placed.

## 2022-10-26 NOTE — Telephone Encounter (Signed)
-----   Message from Rodrigo Ran, [place] sent at 08/31/2022 10:12 AM EST ----- TSH in 8 weeks

## 2022-11-03 DIAGNOSIS — E039 Hypothyroidism, unspecified: Secondary | ICD-10-CM | POA: Diagnosis not present

## 2022-11-03 DIAGNOSIS — Z01419 Encounter for gynecological examination (general) (routine) without abnormal findings: Secondary | ICD-10-CM | POA: Diagnosis not present

## 2022-11-03 DIAGNOSIS — N920 Excessive and frequent menstruation with regular cycle: Secondary | ICD-10-CM | POA: Diagnosis not present

## 2022-11-03 DIAGNOSIS — I10 Essential (primary) hypertension: Secondary | ICD-10-CM | POA: Diagnosis not present

## 2022-11-04 ENCOUNTER — Other Ambulatory Visit: Payer: Self-pay

## 2022-11-04 DIAGNOSIS — N6012 Diffuse cystic mastopathy of left breast: Secondary | ICD-10-CM | POA: Diagnosis not present

## 2022-11-04 DIAGNOSIS — R928 Other abnormal and inconclusive findings on diagnostic imaging of breast: Secondary | ICD-10-CM | POA: Diagnosis not present

## 2022-11-09 ENCOUNTER — Ambulatory Visit (HOSPITAL_COMMUNITY)
Admission: EM | Admit: 2022-11-09 | Discharge: 2022-11-09 | Disposition: A | Discharge status: Home / Self Care | Payer: Federal, State, Local not specified - PPO | Attending: Family Medicine | Admitting: Family Medicine

## 2022-11-09 ENCOUNTER — Encounter (HOSPITAL_COMMUNITY): Payer: Self-pay

## 2022-11-09 DIAGNOSIS — I10 Essential (primary) hypertension: Secondary | ICD-10-CM | POA: Diagnosis not present

## 2022-11-09 DIAGNOSIS — R519 Headache, unspecified: Secondary | ICD-10-CM | POA: Diagnosis not present

## 2022-11-09 DIAGNOSIS — N924 Excessive bleeding in the premenopausal period: Secondary | ICD-10-CM | POA: Diagnosis not present

## 2022-11-09 LAB — CBG MONITORING, ED: Glucose-Capillary: 91 mg/dL (ref 70–99)

## 2022-11-09 MED ORDER — CLONIDINE HCL 0.1 MG PO TABS
ORAL_TABLET | ORAL | Status: AC
  Filled 2022-11-09: qty 1

## 2022-11-09 MED ORDER — HYDROCHLOROTHIAZIDE 25 MG PO TABS: 25.0000 mg | ORAL_TABLET | Freq: Every day | ORAL | 1 refills | Status: DC

## 2022-11-09 MED ORDER — CLONIDINE HCL 0.1 MG PO TABS
0.1000 mg | ORAL_TABLET | Freq: Once | ORAL | Status: AC
  Administered 2022-11-09: 0.1 mg via ORAL

## 2022-11-09 MED ORDER — AMLODIPINE BESYLATE 5 MG PO TABS: 5.0000 mg | ORAL_TABLET | Freq: Every day | ORAL | 1 refills | Status: DC

## 2022-11-09 NOTE — Discharge Instructions (Signed)
Your blood pressure was noted to be elevated during your visit today. If you are currently taking medication for high blood pressure, please ensure you are taking this as directed. If you do not have a history of high blood pressure and your blood pressure remains persistently elevated, you may need to begin taking a medication at some point. You may return here within the next few days to recheck if unable to see your primary care provider or if you do not have a one.  BP (!) 195/122   Pulse 84   Temp 98.6 F (37 C) (Oral)   Resp 16   LMP 11/06/2022 (Approximate)   SpO2 96%   BP Readings from Last 3 Encounters:  11/09/22 (!) 195/122  09/30/22 (!) 158/88  09/28/22 (!) 168/142   Final today 154/110.

## 2022-11-09 NOTE — ED Triage Notes (Addendum)
Patient states she had an OB/GYN 3 days ago and today. Patient states tht her BP today was 210/151. Patient states that she does have frequent headaches and states she has decreased vision even with her glasses on.  Patient states she has not had BP meds in approx 3 months due to a change in insurance. Patient states she was taking amlodipine and HCTZ  BP during triage-198/146.

## 2022-11-11 NOTE — ED Provider Notes (Signed)
[facility]   [id number] 11/09/22 Arrival Time: 1027  ASSESSMENT & PLAN:  1. Elevated blood pressure reading with diagnosis of hypertension   2. Frequent headaches    Feeling somewhat better. Declines ED evaluation. Re-start HTN medications as below. Schedule f/u with PCP asap.    Discharge Instructions      Your blood pressure was noted to be elevated during your visit today. If you are currently taking medication for high blood pressure, please ensure you are taking this as directed. If you do not have a history of high blood pressure and your blood pressure remains persistently elevated, you may need to begin taking a medication at some point. You may return here within the next few days to recheck if unable to see your primary care provider or if you do not have a one.  BP (!) 195/122   Pulse 84   Temp 98.6 F (37 C) (Oral)   Resp 16   LMP 11/06/2022 (Approximate)   SpO2 96%   BP Readings from Last 3 Encounters:  11/09/22 (!) 195/122  09/30/22 (!) 158/88  09/28/22 (!) 168/142   Final today 154/110.     Meds ordered this encounter  Medications   cloNIDine (CATAPRES) tablet 0.1 mg   amLODipine (NORVASC) 5 MG tablet    Sig: Take 1 tablet (5 mg total) by mouth daily.    Dispense:  30 tablet    Refill:  1   hydrochlorothiazide (HYDRODIURIL) 25 MG tablet    Sig: Take 1 tablet (25 mg total) by mouth daily.    Dispense:  30 tablet    Refill:  1     Follow-up Information     Schedule an appointment as soon as possible for a visit  with [place], Betty G, MD.   Specialty: Family Medicine Why: For blood pressure follow up. Contact information: [address] [place] [address] [phone]         [facility] at [facility].   Specialty: Emergency Medicine Why: If symptoms worsen in any way. Contact information: [address] [id number] mc [place] [address] [phone]                 Reviewed expectations re: course of current medical issues. Questions answered. Outlined signs and symptoms indicating need for more acute intervention. Patient verbalized understanding. After Visit Summary given.   SUBJECTIVE:  Jennifer Mcmahon is a 42 y.o. female who presents with concerns regarding increased blood pressures. Reports that she has been treated for hypertension in the past. Has frequent generalized headaches and is curious if these are related to her BP. Occas mild blurry vision; none currently. Normal ambulation. Denies extremity sensation changes or weakness.  Normal PO intake without n/v. Has been on amlodipine and HCTZ in the past; has not taken in past 3 months. BP at other medical facility 210/150 per her report. Denies CP/SOB/LE edema/back pain.  Social History   Tobacco Use  Smoking Status Some Days   Types: Cigarettes   Last attempt to quit: 02/25/2008   Years since quitting: 14.7  Smokeless Tobacco Never  Tobacco Comments   Has stopped and started smoking for years. Some days no smoking, other days chain smoking     OBJECTIVE:  Vitals:   11/09/22 1230 11/09/22 1302 11/09/22 1335 11/09/22 1410  BP: (!) 198/146 (!) 198/146 (!) 195/122 (!) 154/110  Pulse: 84     Resp: 16     Temp: 98.6  F (37 C)     TempSrc: Oral     SpO2: 96%       General appearance: alert; no distress Eyes: PERRLA; EOMI HENT: normocephalic; atraumatic Neck: supple Lungs: unlabored Heart: regular Abdomen: soft Extremities: no edema; symmetrical with no gross deformities Skin: warm and dry Psychological: alert and cooperative; normal mood and affect  Labs: Results for orders placed or performed during the hospital encounter of 11/09/22  POC CBG monitoring  Result Value Ref Range   Glucose-Capillary 91 70 - 99 mg/dL   Labs Reviewed  CBG MONITORING, ED    Allergies  Allergen Reactions   Ramipril Cough    Past Medical History:  Diagnosis Date    Anemia    Anxiety    postpartum   Depression    post partum   Gestational diabetes    diet controlled   Headache    "major issue in the past 4 years"   Hypercalcemia    Hyperlipidemia    Hypertension    not on high blood pressure meds anymore   Hypothyroidism    PE (pulmonary embolism) 2004   Postpartum care following vaginal delivery (11/15) 06/18/2014   Thyroid disease    hypothyroidism   Vitamin D deficiency    Social History   Socioeconomic History   Marital status: Married    Spouse name: Barbara Cower   Number of children: 2   Years of education: 18   Highest education level: Master's degree (e.g., MA, MS, MEng, MEd, MSW, MBA)  Occupational History   Occupation: Careers adviser: [employer]   Occupation: unempolyed  Tobacco Use   Smoking status: Some Days    Types: Cigarettes    Last attempt to quit: 02/25/2008    Years since quitting: 14.7   Smokeless tobacco: Never   Tobacco comments:    Has stopped and started smoking for years. Some days no smoking, other days chain smoking  Vaping Use   Vaping Use: Never used  Substance and Sexual Activity   Alcohol use: Yes    Comment: social   Drug use: No   Sexual activity: Yes    Birth control/protection: Condom  Other Topics Concern   Not on file  Social History Narrative   Patient is right-handed. She lives with her husband and two children in a one level home. She drinks multiple cups of caffeine daily (sources: coffee, soda, monster energy drink, tea). She walks her dog daily.   Social Determinants of Health   Financial Resource Strain: Medium Risk (09/02/2022)   Overall Financial Resource Strain (CARDIA)    Difficulty of Paying Living Expenses: Somewhat hard  Food Insecurity: Food Insecurity Present (09/02/2022)   Hunger Vital Sign    Worried About Running Out of Food in the Last Year: Sometimes true    Ran Out of Food in the Last Year: Sometimes true  Transportation Needs: Unknown (09/02/2022)   PRAPARE  - Administrator, Civil Service (Medical): Not on file    Lack of Transportation (Non-Medical): No  Physical Activity: Insufficiently Active (09/02/2022)   Exercise Vital Sign    Days of Exercise per Week: 2 days    Minutes of Exercise per Session: 20 min  Stress: Stress Concern Present (09/02/2022)   [employer] of Occupational Health - Occupational Stress Questionnaire    Feeling of Stress : To some extent  Social Connections: Moderately Isolated (09/02/2022)   Social Connection and Isolation Panel [NHANES]    Frequency of  Communication with Friends and Family: More than three times a week    Frequency of Social Gatherings with Friends and Family: Once a week    Attends Religious Services: Never    Database administratorActive Member of Clubs or Organizations: No    Attends Engineer, structuralClub or Organization Meetings: Not on file    Marital Status: Married  Catering managerntimate Partner Violence: Not on file   Family History  Problem Relation Age of Onset   Cancer Mother        uterine and ovarian   Hypertension Mother    Varicose Veins Mother    Thyroid disease Mother    Hyperlipidemia Mother    Mental illness Mother    Tuberculosis Sister    Heart disease Maternal Grandmother    Varicose Veins Maternal Grandfather    Mental illness Maternal Grandfather    Cancer Paternal Grandmother    Bipolar disorder Paternal Grandfather    Colon cancer Neg Hx    Liver disease Neg Hx    Esophageal cancer Neg Hx    Past Surgical History:  Procedure Laterality Date   CHOLECYSTECTOMY N/A 06/12/2016   Procedure: LAPAROSCOPIC CHOLECYSTECTOMY;  Surgeon: Axel FillerArmando Ramirez, MD;  Location: [facility] OR;  Service: General;  Laterality: N/A;   EYE SURGERY     lasik   WISDOM TOOTH EXTRACTION         Mardella LaymanHagler, Najee Cowens, MD 11/11/22 1308

## 2022-11-17 DIAGNOSIS — Z1231 Encounter for screening mammogram for malignant neoplasm of breast: Secondary | ICD-10-CM

## 2022-12-16 ENCOUNTER — Other Ambulatory Visit: Payer: Self-pay | Admitting: Family Medicine

## 2022-12-16 DIAGNOSIS — E039 Hypothyroidism, unspecified: Secondary | ICD-10-CM

## 2022-12-18 MED ORDER — SYNTHROID 125 MCG PO TABS: ORAL_TABLET | ORAL | 2 refills | Status: DC

## 2022-12-29 ENCOUNTER — Ambulatory Visit: Payer: Federal, State, Local not specified - PPO | Admitting: Internal Medicine

## 2022-12-29 ENCOUNTER — Encounter: Payer: Self-pay | Admitting: Internal Medicine

## 2022-12-29 VITALS — BP 170/110 | HR 84 | Ht 65.5 in | Wt 220.5 lb

## 2022-12-29 DIAGNOSIS — R1310 Dysphagia, unspecified: Secondary | ICD-10-CM | POA: Diagnosis not present

## 2022-12-29 DIAGNOSIS — K219 Gastro-esophageal reflux disease without esophagitis: Secondary | ICD-10-CM | POA: Diagnosis not present

## 2022-12-29 DIAGNOSIS — R140 Abdominal distension (gaseous): Secondary | ICD-10-CM | POA: Diagnosis not present

## 2022-12-29 DIAGNOSIS — I10 Essential (primary) hypertension: Secondary | ICD-10-CM

## 2022-12-29 MED ORDER — PANTOPRAZOLE SODIUM 40 MG PO TBEC
40.0000 mg | DELAYED_RELEASE_TABLET | Freq: Two times a day (BID) | ORAL | 3 refills | Status: DC
Start: 2022-12-29 — End: 2023-08-25

## 2022-12-29 NOTE — Progress Notes (Signed)
HISTORY OF PRESENT ILLNESS:  Jennifer Mcmahon is a 42 y.o. female who was initially evaluated in this office September 28, 2022 regarding GERD, globus sensation, intermittent solid food dysphagia as well as atypical dysphagia, bloating, and hypertension.  See that dictation for details.  She was advised with regards to reflux precautions, weight loss, and tobacco cessation.  Continued on pantoprazole 40 mg daily.  And set up for upper endoscopy which was performed September 30, 2022.  She was found to have a benign distal esophageal stricture as well as mild reflux esophagitis.  Few superficial gastric erosions noted.  Otherwise normal.  Biopsies of the gastric antrum were unremarkable.  No H. pylori.  She was continued on PPI.  She follows up at this time.  Her intermittent solid food dysphagia has improved.  She does describe some breakthrough reflux symptoms on 1 daily PPI.  She recently ran out of her PPI, which resulted in severe reflux symptoms.  Just refilled..  She still has globus type sensation as well as vague chest complaints.  She continues to be hypertensive for which she has been scheduled to see a cardiologist.  She continues with bloating.  She describes life as stressful.  She has a new job.  She keeps busy with her children.  She continues to smoke.  No new complaints.  GI review of systems is otherwise negative  REVIEW OF SYSTEMS:  All non-GI ROS negative unless otherwise stated in HPI except for anxiety, depression, visual change, headaches, menstrual cramps, sleeping problems, excessive thirst, swollen lymph glands, urinary leakage  Past Medical History:  Diagnosis Date   Anemia    Anxiety    postpartum   Depression    post partum   Gestational diabetes    diet controlled   Headache    "major issue in the past 4 years"   Hypercalcemia    Hyperlipidemia    Hypertension    not on high blood pressure meds anymore   Hypothyroidism    PE (pulmonary embolism) 2004    Postpartum care following vaginal delivery (11/15) 06/18/2014   Thyroid disease    hypothyroidism   Vitamin D deficiency     Past Surgical History:  Procedure Laterality Date   CHOLECYSTECTOMY N/A 06/12/2016   Procedure: LAPAROSCOPIC CHOLECYSTECTOMY;  Surgeon: Axel Filler, MD;  Location: [facility] OR;  Service: General;  Laterality: N/A;   EYE SURGERY     lasik   WISDOM TOOTH EXTRACTION      Social History Jennifer Mcmahon  reports that she has been smoking cigarettes. She has never used smokeless tobacco. She reports current alcohol use. She reports that she does not use drugs.  family history includes Bipolar disorder in her paternal grandfather; Cancer in her mother and paternal grandmother; Heart disease in her maternal grandmother; Hyperlipidemia in her mother; Hypertension in her mother; Mental illness in her maternal grandfather and mother; Thyroid disease in her mother; Tuberculosis in her sister; Varicose Veins in her maternal grandfather and mother.  Allergies  Allergen Reactions   Ramipril Cough       PHYSICAL EXAMINATION: Vital signs: BP (!) 170/110   Pulse 84   Ht 5' 5.5" (1.664 m)   Wt 220 lb 8 oz (100 kg)   LMP 12/23/2022   BMI 36.14 kg/m   Constitutional: generally well-appearing, no acute distress Psychiatric: alert and oriented x3, cooperative Eyes: extraocular movements intact, anicteric, conjunctiva pink Mouth: oral pharynx moist, no lesions Neck: supple no lymphadenopathy Cardiovascular: heart regular rate  and rhythm, no murmur Lungs: clear to auscultation bilaterally Abdomen: soft, nontender, nondistended, no obvious ascites, no peritoneal signs, normal bowel sounds, no organomegaly Rectal: Omitted Extremities: no clubbing, cyanosis, or lower extremity edema bilaterally Skin: no lesions on visible extremities Neuro: No focal deficits.  Cranial nerves intact  ASSESSMENT:  1.  GERD.  Breakthrough symptoms on once daily PPI 2.  Globus type  sensation.  Possibly related to GERD.  Possibly related to anxiety 3.  Esophageal stricture status post dilation.  Improvement in intermittent solid food dysphagia 4.  Anxiety.  Ongoing 5.  Hypertension.  Ongoing.  She has been monitoring at home.  Being seen by cardiology soon   PLAN:  1.  Reflux precautions 2.  Weight loss 3.  Stop smoking 4.  Increase pantoprazole to 40 mg twice daily for breakthrough reflux symptoms.  Medication risk reviewed.  Multiple refills. 5.  Routine GI office follow-up 3 months.  Contact the office in the interim for any questions or problems Total, 30 minutes spent preparing to see the patient, obtaining interval history, performing medically appropriate physical exam, counseling educating the patient regarding above listed issues, ordering medication, arranging follow-up, and documenting clinical information in the health record.

## 2022-12-29 NOTE — Patient Instructions (Signed)
We have sent the following medications to your pharmacy for you to pick up at your convenience:  Pantoprazole - twice a day.  Please follow up on:  _______________________________________________________  If your blood pressure at your visit was 140/90 or greater, please contact your primary care physician to follow up on this.  _______________________________________________________  If you are age 44 or older, your body mass index should be between 23-30. Your Body mass index is 36.14 kg/m. If this is out of the aforementioned range listed, please consider follow up with your Primary Care Provider.  If you are age 85 or younger, your body mass index should be between 19-25. Your Body mass index is 36.14 kg/m. If this is out of the aformentioned range listed, please consider follow up with your Primary Care Provider.   ________________________________________________________  The [facility] providers would like to encourage you to use [facility] to communicate with providers for non-urgent requests or questions.  Due to long hold times on the telephone, sending your provider a message by [facility] may be a faster and more efficient way to get a response.  Please allow 48 business hours for a response.  Please remember that this is for non-urgent requests.  _______________________________________________________

## 2023-01-08 NOTE — Progress Notes (Unsigned)
  Cardiology Office Note:   Date:  01/08/2023  ID:  Jennifer Mcmahon, DOB [birth year], MRN [id number]  History of Present Illness:   Jennifer Mcmahon is a 42 y.o. female anxiety, depression, gestational DM and HLD who was referred by Dr. Cherly Hensen for further evaluation of HTN.  Patient seen in the [facility] 11/09/22. Notes reviewed. Had been having severely elevated blood pressures to 210/150s. She was started on amlodipine 5mg  daily and HCTZ 25mg  daily at that time.  Today, ***  Past Medical History:  Diagnosis Date   Anemia    Anxiety    postpartum   Depression    post partum   Gestational diabetes    diet controlled   Headache    "major issue in the past 4 years"   Hypercalcemia    Hyperlipidemia    Hypertension    not on high blood pressure meds anymore   Hypothyroidism    PE (pulmonary embolism) 2004   Postpartum care following vaginal delivery (11/15) 06/18/2014   Thyroid disease    hypothyroidism   Vitamin D deficiency      ROS: ***  Studies Reviewed:    EKG:  ***       Risk Assessment/Calculations:   {Does this patient have ATRIAL FIBRILLATION?:[phone]} No BP recorded.  {Refresh Note OR Click here to enter BP  :1}***        Physical Exam:   VS:  LMP 12/23/2022    Wt Readings from Last 3 Encounters:  12/29/22 220 lb 8 oz (100 kg)  09/30/22 215 lb (97.5 kg)  09/28/22 215 lb (97.5 kg)     GEN: Well nourished, well developed in no acute distress NECK: No JVD; No carotid bruits CARDIAC: ***RRR, no murmurs, rubs, gallops RESPIRATORY:  Clear to auscultation without rales, wheezing or rhonchi  ABDOMEN: Soft, non-tender, non-distended EXTREMITIES:  No edema; No deformity   ASSESSMENT AND PLAN:   #Severe HTN: -Poorly controlled -Increase amlodipine to 10mg  daily -Change HCTZ to chlorthalidone 25mg  daily with K -Start *** -Check renin/aldo, TSH, and renal ultrasound -No signs/symptoms of OSA -Refer to Pharm D for medication titration    {Are you  ordering a CV Procedure (e.g. stress test, cath, DCCV, TEE, etc)?   Press F2        :[id number]}   Signed, Meriam Sprague, MD

## 2023-01-13 ENCOUNTER — Encounter: Payer: Self-pay | Admitting: Cardiology

## 2023-01-13 ENCOUNTER — Ambulatory Visit: Payer: Federal, State, Local not specified - PPO | Attending: Cardiology | Admitting: Cardiology

## 2023-01-13 VITALS — BP 148/102 | HR 87 | Ht 66.0 in | Wt 220.4 lb

## 2023-01-13 DIAGNOSIS — R0683 Snoring: Secondary | ICD-10-CM

## 2023-01-13 DIAGNOSIS — I517 Cardiomegaly: Secondary | ICD-10-CM | POA: Diagnosis not present

## 2023-01-13 DIAGNOSIS — I1A0 Resistant hypertension: Secondary | ICD-10-CM

## 2023-01-13 DIAGNOSIS — R9431 Abnormal electrocardiogram [ECG] [EKG]: Secondary | ICD-10-CM | POA: Diagnosis not present

## 2023-01-13 MED ORDER — AMLODIPINE BESYLATE 10 MG PO TABS
10.0000 mg | ORAL_TABLET | Freq: Every day | ORAL | 2 refills | Status: DC
Start: 2023-01-13 — End: 2023-10-06

## 2023-01-13 MED ORDER — CHLORTHALIDONE 25 MG PO TABS
25.0000 mg | ORAL_TABLET | Freq: Every day | ORAL | 3 refills | Status: DC
Start: 2023-01-13 — End: 2024-01-11

## 2023-01-13 MED ORDER — VALSARTAN 160 MG PO TABS
160.0000 mg | ORAL_TABLET | Freq: Every day | ORAL | 2 refills | Status: DC
Start: 2023-01-13 — End: 2023-02-02

## 2023-01-13 NOTE — Patient Instructions (Signed)
Medication Instructions:    STOP TAKING HYDROCHLOROTHIAZIDE NOW  START TAKING CHLORTHALIDONE 25 MG BY MOUTH DAILY  START TAKING VALSARTAN 160 MG BY MOUTH DAILY  INCREASE YOUR AMLODIPINE TO 10 MG BY MOUTH DAILY  *If you need a refill on your cardiac medications before your next appointment, please call your pharmacy*    You have been referred to OUR BLOOD PRESSURE CLINIC TO SEE THE PHARMACIST FOR MEDICATION TITRATION    Lab Work:  IN 2 WEEKS HERE IN THE OFFICE--BMET  If you have labs (blood work) drawn today and your tests are completely normal, you will receive your results only by: [employer] (if you have MyChart) OR A paper copy in the mail If you have any lab test that is abnormal or we need to change your treatment, we will call you to review the results.   Testing/Procedures:  Your physician has recommended that you have a IN LAB SPLIT NIGHT sleep study. This test records several body functions during sleep, including: brain activity, eye movement, oxygen and carbon dioxide blood levels, heart rate and rhythm, breathing rate and rhythm, the flow of air through your mouth and nose, snoring, body muscle movements, and chest and belly movement.    Follow-Up: At [facility], you and your health needs are our priority.  As part of our continuing mission to provide you with exceptional heart care, we have created designated Provider Care Teams.  These Care Teams include your primary Cardiologist (physician) and Advanced Practice Providers (APPs -  Physician Assistants and Nurse Practitioners) who all work together to provide you with the care you need, when you need it.  We recommend signing up for the patient portal called "MyChart".  Sign up information is provided on this After Visit Summary.  MyChart is used to connect with patients for Virtual Visits (Telemedicine).  Patients are able to view lab/test results, encounter notes, upcoming appointments, etc.   Non-urgent messages can be sent to your provider as well.   To learn more about what you can do with MyChart, go to [email or web address].    Your next appointment:   6 month(s)  Provider:   Chilton Si, MD

## 2023-01-14 ENCOUNTER — Telehealth: Payer: Self-pay

## 2023-01-14 NOTE — Telephone Encounter (Signed)
**Note De-Identified  Obfuscation** I called [employer] at [phone] opt 2 and started a Split Night Sleep Study PA with Ciara. As instructed I have faxed clinicals/notes to them at [phone] and per Ciara it can take up to 15 days for a determination to be reached. Case #: [id number]

## 2023-01-15 ENCOUNTER — Telehealth: Payer: Self-pay | Admitting: *Deleted

## 2023-01-15 DIAGNOSIS — I10 Essential (primary) hypertension: Secondary | ICD-10-CM

## 2023-01-15 DIAGNOSIS — I1A0 Resistant hypertension: Secondary | ICD-10-CM

## 2023-01-15 DIAGNOSIS — R0683 Snoring: Secondary | ICD-10-CM

## 2023-01-15 NOTE — Telephone Encounter (Signed)
-----   Message from Loa Socks, LPN sent at 1/61/0960 11:44 AM EDT ----- Regarding: SPLIT NIGHT SLEEP STUDY PER DR. Shari Prows Dr. Shari Prows wants this pt to have a split night sleep study only done (in lab) for possible OSA and pt snores.  Dr. Shari Prows did the stop bang for this  Can you please arrange and let us know the date thereafter?  Thanks [employer]

## 2023-01-18 ENCOUNTER — Encounter: Payer: Self-pay | Admitting: Pharmacist

## 2023-01-18 ENCOUNTER — Ambulatory Visit: Payer: Federal, State, Local not specified - PPO | Attending: Internal Medicine | Admitting: Pharmacist

## 2023-01-18 VITALS — BP 140/90 | HR 90

## 2023-01-18 DIAGNOSIS — I10 Essential (primary) hypertension: Secondary | ICD-10-CM | POA: Diagnosis not present

## 2023-01-18 LAB — BASIC METABOLIC PANEL WITH GFR
BUN/Creatinine Ratio: 10 (ref 9–23)
BUN: 11 mg/dL (ref 6–24)
CO2: 23 mmol/L (ref 20–29)
Calcium: 10.1 mg/dL (ref 8.7–10.2)
Chloride: 102 mmol/L (ref 96–106)
Creatinine, Ser: 1.08 mg/dL — ABNORMAL HIGH (ref 0.57–1.00)
Glucose: 98 mg/dL (ref 70–99)
Potassium: 4.3 mmol/L (ref 3.5–5.2)
Sodium: 138 mmol/L (ref 134–144)
eGFR: 65 mL/min/1.73

## 2023-01-18 NOTE — Patient Instructions (Addendum)
Your blood pressure goal is < 130/72mmHg  Continue taking your current medications  Monitor and record your blood pressure at home  Switch from ibuprofen to acetaminophen  Try to change to more half caff coffee  I'll call you in 2 weeks for an update with your readings  Important lifestyle changes to control high blood pressure  Intervention  Effect on the BP   Weight loss Weight loss is one of the most effective lifestyle changes for controlling blood pressure. If you're overweight or obese, losing even a small amount of weight can help reduce blood pressure.    Blood pressure can decrease by 1 millimeter of mercury (mmHg) with each kilogram (about 2.2 pounds) of weight lost.   Exercise regularly As a general goal, aim for 30 minutes of moderate physical activity every day.    Regular physical activity can lower blood pressure by 5 - 8 mmHg.   Eat a healthy diet Eat a diet rich in whole grains, fruits, vegetables, lean meat, and low-fat dairy products. Limit processed foods, saturated fat, and sweets.    A heart-healthy diet can lower high blood pressure by 10 mmHg.   Reduce salt (sodium) in your diet Aim for 000mg  of sodium each day. Avoid deli meats, canned food, and frozen microwave meals which are high in sodium.     Limiting sodium can reduce blood pressure by 5 mmHg.   Limit alcohol One drink equals 12 ounces of beer, 5 ounces of wine, or 1.5 ounces of 80-proof liquor.    Limiting alcohol to < 1 drink a day for women or < 2 drinks a day for men can help lower blood pressure by about 4 mmHg.   To check your pressure at home you will need to:   Sit up in a chair, with feet flat on the floor and back supported. Do not cross your ankles or legs. Rest your left arm so that the cuff is about heart level. If the cuff goes on your upper arm, then just relax your arm on the table, arm of the chair, or your lap. If you have a wrist cuff, hold your wrist against your  chest at heart level. Place the cuff snugly around your arm, about 1 inch above the crease of your elbow. The cords should be inside the groove of your elbow.  Sit quietly, with the cuff in place, for about 5 minutes. Then press the power button to start a reading. Do not talk or move while the reading is taking place.  Record your readings on a sheet of paper. Although most cuffs have a memory, it is often easier to see a pattern developing when the numbers are all in front of you.  You can repeat the reading after 1-3 minutes if it is recommended.   Make sure your bladder is empty and you have not had caffeine or tobacco within the last 30 minutes   Always bring your blood pressure log with you to your appointments. If you have not brought your monitor in to be double checked for accuracy, please bring it to your next appointment.   You can find a list of validated (accurate) blood pressure cuffs at: validatebp.org

## 2023-01-18 NOTE — Progress Notes (Signed)
Patient ID: Jennifer Mcmahon                 DOB: [birth year]                      MRN: [id number]     HPI: Jennifer Mcmahon is a 42 y.o. female referred by Dr. Shari Prows to HTN clinic. PMH is significant for HTN, HLD, gestational DM, obesity, anxiety, and depression. Seen at urgent care 11/09/22 with BP up to 210s/150s. She was started on amlodipine 5mg  daily and hydrochlorothiazide 25mg  daily. Pt established with Dr Shari Prows on 01/13/23 where her BP was elevated at 148/102 in clinic. Home readings at that time ranged 160-180s/100s. Reported strong FHx of HTN on her mother's side. Her amlodipine was increased to 10mg  daily, hydrochlorothiazide was changed to chlorthalidone 25mg  daily, started on valsartan 160mg  daily, referred for sleep study which is pending and to PharmD for HTN f/u.  Pt presents today for follow up. Implemented med changes on 6/13. Has been tolerating meds well so far. Has headaches at baseline. No dizziness or LE edema. Noticing less "whooshing" sound in her ears after recent med changes. Sleeping better as well the past few nights. Took her AM meds about 1 hour ago. Has a bicep BP cuff at home, hasn't had a chance to check readings since most recent med changes last week. Hasn't brought cuff in to office before for calibration. Takes ibuprofen 400mg  5 days a week for headache. Watches her salt intake. Does have decent caffeine intake - 4-6 cups of coffee a day and some soda.  Current HTN meds:  Amlodipine 10mg  daily - PM Chlorthalidone 25mg  daily - AM Valsartan 160mg  daily - AM  Previously tried: ramipril - cough  BP goal: <130/11mmHg  Family History: Mother with HTN, maternal grandmother with heart disease.  Social History: On and off tobacco use, social alcohol use.  Diet: watches her salt. Drinks coffee (4-6 cups a day - half are full caffeinated, the other half are half caff), some soda, and water.  Exercise: did not have time to discuss today.  Wt Readings from  Last 3 Encounters:  01/13/23 220 lb 6.4 oz (100 kg)  12/29/22 220 lb 8 oz (100 kg)  09/30/22 215 lb (97.5 kg)   BP Readings from Last 3 Encounters:  01/13/23 (!) 148/102  12/29/22 (!) 170/110  11/09/22 (!) 154/110   Pulse Readings from Last 3 Encounters:  01/13/23 87  12/29/22 84  11/09/22 84    Renal function: CrCl cannot be calculated (Patient's most recent lab result is older than the maximum 21 days allowed.).  Past Medical History:  Diagnosis Date   Anemia    Anxiety    postpartum   Depression    post partum   Gestational diabetes    diet controlled   Headache    "major issue in the past 4 years"   Hypercalcemia    Hyperlipidemia    Hypertension    not on high blood pressure meds anymore   Hypothyroidism    PE (pulmonary embolism) 2004   Postpartum care following vaginal delivery (11/15) 06/18/2014   Thyroid disease    hypothyroidism   Vitamin D deficiency     Current Outpatient Medications on File Prior to Visit  Medication Sig Dispense Refill   amLODipine (NORVASC) 10 MG tablet Take 1 tablet (10 mg total) by mouth daily. 90 tablet 2   chlorthalidone (HYGROTON) 25 MG tablet Take 1 tablet (25  mg total) by mouth daily. 90 tablet 3   OVER THE COUNTER MEDICATION Take 1 tablet by mouth daily as needed. Sleep aid     pantoprazole (PROTONIX) 40 MG tablet Take 1 tablet (40 mg total) by mouth 2 (two) times daily. 180 tablet 3   ropinirole (REQUIP) 5 MG tablet Take 1 tablet (5 mg total) by mouth at bedtime. 90 tablet 2   SYNTHROID 125 MCG tablet Take 1.5 tabs Monday, Wednesday,and Fridays. Continue 1 tab the rest of the days. 90 tablet 2   valsartan (DIOVAN) 160 MG tablet Take 1 tablet (160 mg total) by mouth daily. 90 tablet 2   No current facility-administered medications on file prior to visit.    Allergies  Allergen Reactions   Ramipril Cough     Assessment/Plan:  1. Hypertension - BP above goal < 130/25mmHg but improving and she's only been on new meds  for 5 days so far. Moved up labs and will check BMET today with recent ARB start. Will continue amlodipine 10mg  daily, chlorthalidone 25mg  daily, and valsartan 160mg  daily as long as labs are stable to give pt more time on this regimen. Also encouraged her to change from ibuprofen to acetaminophen, and to change from caffeinated to half caff coffee. She will monitor BP at home and I'll call her in 2 weeks to see how readings are trending. Can increase valsartan if needed pending lab stability.  Khalessi Blough E. Crosby Oriordan, PharmD, BCACP, CPP [facility] [address], [place], [place] [address] Phone: [phone]; Fax: [phone] 01/18/2023 8:46 AM

## 2023-01-26 NOTE — Addendum Note (Signed)
Addended by: Brunetta Genera on: 01/26/2023 09:01 AM   Modules accepted: Orders

## 2023-01-27 ENCOUNTER — Ambulatory Visit: Payer: Federal, State, Local not specified - PPO

## 2023-02-01 ENCOUNTER — Telehealth: Payer: Self-pay | Admitting: Pharmacist

## 2023-02-01 DIAGNOSIS — R9431 Abnormal electrocardiogram [ECG] [EKG]: Secondary | ICD-10-CM

## 2023-02-01 DIAGNOSIS — I1A0 Resistant hypertension: Secondary | ICD-10-CM

## 2023-02-01 DIAGNOSIS — R0683 Snoring: Secondary | ICD-10-CM

## 2023-02-01 DIAGNOSIS — I517 Cardiomegaly: Secondary | ICD-10-CM

## 2023-02-01 NOTE — Telephone Encounter (Signed)
Called pt and left message.  Following up with home BP readings on amlodipine 10mg  daily, chlorthalidone 25mg  daily, and valsartan 160mg  daily.   She was to also change ibuprofen to acetaminophen, and change from full caffeinated coffee to 1/2 caffeinated (drinking 4-6 cups a day).  She was in a rush at her last appt and did not have time to discuss physical activity or tobacco use.   Can increase valsartan to 320mg  if needed, would need f/u appt and BMET scheduled.

## 2023-02-01 NOTE — Telephone Encounter (Signed)
  Pt is returning call, she said, she is away from home and doesn't have her BP readings.

## 2023-02-01 NOTE — Telephone Encounter (Signed)
Will call back later this week when pt can look at BP readings.

## 2023-02-02 MED ORDER — VALSARTAN 320 MG PO TABS
320.0000 mg | ORAL_TABLET | Freq: Every day | ORAL | 3 refills | Status: DC
Start: 2023-02-02 — End: 2024-01-03

## 2023-02-02 NOTE — Telephone Encounter (Signed)
Called pt, she doesn't have specific BP readings on her as she's away from home traveling. Recalls readings ranging 138-140/90-120 though.  Reports she changed from NSAIDs to Tylenol. Has 1 cup of caffeinated coffee in the AM, then changes to 1/2 caff after.  Sits most of the day as she works on her computer, however she takes breaks to walk a few time a day.  Smoking "not quite a whole pack" a day. Trying to cut back. Has quit for years in the past then resumes, reports it's a stress response. Tried Wellbutrin previously which didn't help much. Mentioned NRT products, she does not want to try.   Sleeping better since she moved her amlodipine dose to the PM.  She is agreeable to increase her valsartan from 160mg  to 320mg  daily. She'll continue her other meds and monitor her BP at home. I'll call her in 3 weeks to follow up with BP readings. Will schedule repeat BMET at that time.

## 2023-02-23 ENCOUNTER — Telehealth: Payer: Self-pay | Admitting: Pharmacist

## 2023-02-23 DIAGNOSIS — I1A0 Resistant hypertension: Secondary | ICD-10-CM

## 2023-02-23 NOTE — Telephone Encounter (Signed)
Called pt for updated BP readings since increasing her valsartan to 320mg . Reports systolic 120s-130s, diastolic still high 100-110s. Feeling better overall though and noticing less "whooshing" in her ears. Will need BMET scheduled on dose increase of valsartan. Reports [place]'s out of the [place] and will call back to schedule labs once she's in town. If labs are stable, would plan to add spironolactone.

## 2023-03-01 ENCOUNTER — Other Ambulatory Visit: Payer: Self-pay | Admitting: Family Medicine

## 2023-03-01 DIAGNOSIS — E039 Hypothyroidism, unspecified: Secondary | ICD-10-CM

## 2023-03-12 NOTE — Telephone Encounter (Signed)
Called pt, hadn't heard back from her regarding scheduling BMET. No answer, left message.

## 2023-03-23 NOTE — Telephone Encounter (Signed)
Called pt again, left another message.

## 2023-04-06 NOTE — Addendum Note (Signed)
Addended by: Skyllar Notarianni E on: 04/06/2023 09:41 AM   Modules accepted: Orders

## 2023-04-06 NOTE — Telephone Encounter (Signed)
Called pt again, she will come in Thursday for BMET.

## 2023-04-08 ENCOUNTER — Ambulatory Visit: Payer: Federal, State, Local not specified - PPO | Attending: Family Medicine

## 2023-04-08 DIAGNOSIS — I1A0 Resistant hypertension: Secondary | ICD-10-CM | POA: Diagnosis not present

## 2023-04-09 ENCOUNTER — Telehealth: Payer: Self-pay | Admitting: Pharmacist

## 2023-04-09 LAB — BASIC METABOLIC PANEL
BUN/Creatinine Ratio: 16 (ref 9–23)
BUN: 15 mg/dL (ref 6–24)
CO2: 20 mmol/L (ref 20–29)
Calcium: 9.6 mg/dL (ref 8.7–10.2)
Chloride: 100 mmol/L (ref 96–106)
Creatinine, Ser: 0.92 mg/dL (ref 0.57–1.00)
Glucose: 87 mg/dL (ref 70–99)
Potassium: 4.2 mmol/L (ref 3.5–5.2)
Sodium: 136 mmol/L (ref 134–144)
eGFR: 79 mL/min/{1.73_m2} (ref >59)

## 2023-04-09 MED ORDER — SPIRONOLACTONE 25 MG PO TABS: 25.0000 mg | ORAL_TABLET | Freq: Every day | ORAL | 11 refills | Status: AC

## 2023-04-09 NOTE — Telephone Encounter (Signed)
Called pt and advised her that BMET is stable. She reports BP readings have remained elevated at home 135-150/100. Confirms adherence to amlodipine 10mg  daily, chlorthalidone 25mg  daily, and valsartan 320mg  daily. She is agreeable to start spironolactone 25mg  daily. Will repeat BMET and BP check in 3 weeks.

## 2023-04-27 ENCOUNTER — Ambulatory Visit: Payer: Federal, State, Local not specified - PPO | Admitting: Internal Medicine

## 2023-04-27 DIAGNOSIS — N924 Excessive bleeding in the premenopausal period: Secondary | ICD-10-CM | POA: Diagnosis not present

## 2023-04-27 DIAGNOSIS — E039 Hypothyroidism, unspecified: Secondary | ICD-10-CM | POA: Diagnosis not present

## 2023-04-30 ENCOUNTER — Ambulatory Visit: Payer: Federal, State, Local not specified - PPO | Attending: Cardiovascular Disease | Admitting: Pharmacist

## 2023-04-30 VITALS — BP 108/74 | HR 91

## 2023-04-30 DIAGNOSIS — I1A0 Resistant hypertension: Secondary | ICD-10-CM | POA: Diagnosis not present

## 2023-04-30 NOTE — Progress Notes (Signed)
Patient ID: Jennifer Mcmahon                 DOB: [birth year]                      MRN: [id number]     HPI: Jennifer Mcmahon is a 42 y.o. female referred by Dr. Shari Prows to HTN clinic. PMH is significant for HTN, HLD, gestational DM, obesity, anxiety, and depression. Seen at urgent care 11/09/22 with BP up to 210s/150s. She was started on amlodipine 5mg  daily and hydrochlorothiazide 25mg  daily. Pt established with Dr Shari Prows on 01/13/23 where her BP was elevated at 148/102 in clinic. Home readings at that time ranged 160-180s/100s. Reported strong FHx of HTN on her mother's side. Her amlodipine was increased to 10mg  daily, hydrochlorothiazide was changed to chlorthalidone 25mg  daily, started on valsartan 160mg  daily, referred for sleep study which is pending and to PharmD for HTN f/u.  I saw pt on 01/18/23 where BP was 140/90. She was encouraged to change from ibuprofen to acetaminophen, to change to half decaf coffee, and quit smoking. Home BP readings remained elevated since then, and her valsartan dose was increased to 320mg  daily, and more recently spironolactone 25mg  daily was started.  headaches at baseline. No dizziness or LE edema. Noticing less "whooshing" sound in her ears after recent med changes. Sleeping better as well the past few nights. Took her AM meds about 1 hour ago. Has a bicep BP cuff at home, hasn't had a chance to check readings since most recent med changes last week. Hasn't brought cuff in to office before for calibration. Takes ibuprofen 400mg  5 days a week for headache. Watches her salt intake. Does have decent caffeine intake - 4-6 cups of coffee a day and some soda.  Pt presents today for follow up. Reports tolerating medications well. Still feeling some whooshing sound in her ears, initially improved when her BP came down. Has headaches at baseline. Occasional dizziness when she goes from squatting to standing, once or twice a week. This is stable and hasn't worsened with  recent med changes. Smoking less, about 1/2 PPD. Stress is a trigger.  Purchased a new wrist cuff that she brings in today. Average BP over the past 2 weeks 123/74. Other readings: 114/67, 122/78, 133/78, 125/68, 110/63, 119/70, 122/71, 140/74, 136/80, 131/72, HR 100-110. Higher readings are when she checks in the AM right before taking meds.  Home cuff: 122/78, repeat 119/75 Clinic reading: 108/74 x2  Current HTN meds:  Amlodipine 10mg  daily - PM Chlorthalidone 25mg  daily - AM Valsartan 320mg  daily - AM Spironolactone 25mg  daily - AM  Previously tried: ramipril - cough  BP goal: <130/67mmHg  Family History: Mother with HTN, maternal grandmother with heart disease.  Social History: Smoking close to a pack a day, has quit for years in the past then resumes due to stress. Wellbutrin previously ineffective. Declines NRT.  Diet: watches her salt. Drinks coffee (4-5 cups a day - only 2 are caffeinated), some soda, and water.  Exercise: Sits most of the day as she works on her computer, but takes breaks to walk a few times a day.  Wt Readings from Last 3 Encounters:  01/13/23 220 lb 6.4 oz (100 kg)  12/29/22 220 lb 8 oz (100 kg)  09/30/22 215 lb (97.5 kg)   BP Readings from Last 3 Encounters:  01/18/23 (!) 140/90  01/13/23 (!) 148/102  12/29/22 (!) 170/110   Pulse Readings from Last  3 Encounters:  01/18/23 90  01/13/23 87  12/29/22 84    Renal function: CrCl cannot be calculated (Patient's most recent lab result is older than the maximum 21 days allowed.).  Past Medical History:  Diagnosis Date   Anemia    Anxiety    postpartum   Depression    post partum   Gestational diabetes    diet controlled   Headache    "major issue in the past 4 years"   Hypercalcemia    Hyperlipidemia    Hypertension    not on high blood pressure meds anymore   Hypothyroidism    PE (pulmonary embolism) 2004   Postpartum care following vaginal delivery (11/15) 06/18/2014   Thyroid  disease    hypothyroidism   Vitamin D deficiency     Current Outpatient Medications on File Prior to Visit  Medication Sig Dispense Refill   amLODipine (NORVASC) 10 MG tablet Take 1 tablet (10 mg total) by mouth daily. 90 tablet 2   chlorthalidone (HYGROTON) 25 MG tablet Take 1 tablet (25 mg total) by mouth daily. 90 tablet 3   OVER THE COUNTER MEDICATION Take 1 tablet by mouth daily as needed. Sleep aid     pantoprazole (PROTONIX) 40 MG tablet Take 1 tablet (40 mg total) by mouth 2 (two) times daily. 180 tablet 3   ropinirole (REQUIP) 5 MG tablet Take 1 tablet (5 mg total) by mouth at bedtime. 90 tablet 2   spironolactone (ALDACTONE) 25 MG tablet Take 1 tablet (25 mg total) by mouth daily. 30 tablet 11   SYNTHROID 125 MCG tablet Take 1.5 tabs Monday, Wednesday,and Fridays. Continue 1 tab the rest of the days. 90 tablet 2   valsartan (DIOVAN) 320 MG tablet Take 1 tablet (320 mg total) by mouth daily. 90 tablet 3   No current facility-administered medications on file prior to visit.    Allergies  Allergen Reactions   Ramipril Cough     Assessment/Plan:  1. Hypertension - BP now excellent and at goal < 130/14mmHg. Continue amlodipine 10mg  daily, chlorthalidone 25mg  daily, valsartan 320mg  daily, and spironolactone 25mg  daily. Checking BMET today with recent spironolactone start. Discussed increasing physical activity, limiting salt, and cutting back on tobacco use as well. F/u with PharmD as needed.  Shante Archambeault E. Zeniya Lapidus, PharmD, BCACP, CPP [facility] [address] [place], [place] [address] Phone: [phone]; Fax: [phone] 04/30/2023 8:11 AM

## 2023-04-30 NOTE — Patient Instructions (Addendum)
Your blood pressure goal is < 130/24mmHg   Your blood pressure is excellent! Please continue taking your current medications  Work to cut back on smoking  Important lifestyle changes to control high blood pressure  Intervention  Effect on the BP   Weight loss Weight loss is one of the most effective lifestyle changes for controlling blood pressure. If you're overweight or obese, losing even a small amount of weight can help reduce blood pressure.    Blood pressure can decrease by 1 millimeter of mercury (mmHg) with each kilogram (about 2.2 pounds) of weight lost.   Exercise regularly As a general goal, aim for 30 minutes of moderate physical activity every day.    Regular physical activity can lower blood pressure by 5 - 8 mmHg.   Eat a healthy diet Eat a diet rich in whole grains, fruits, vegetables, lean meat, and low-fat dairy products. Limit processed foods, saturated fat, and sweets.    A heart-healthy diet can lower high blood pressure by 10 mmHg.   Reduce salt (sodium) in your diet Aim for 000mg  of sodium each day. Avoid deli meats, canned food, and frozen microwave meals which are high in sodium.     Limiting sodium can reduce blood pressure by 5 mmHg.   Limit alcohol One drink equals 12 ounces of beer, 5 ounces of wine, or 1.5 ounces of 80-proof liquor.    Limiting alcohol to < 1 drink a day for women or < 2 drinks a day for men can help lower blood pressure by about 4 mmHg.   To check your pressure at home you will need to:   Sit up in a chair, with feet flat on the floor and back supported. Do not cross your ankles or legs. Rest your left arm so that the cuff is about heart level. If the cuff goes on your upper arm, then just relax your arm on the table, arm of the chair, or your lap. If you have a wrist cuff, hold your wrist against your chest at heart level. Place the cuff snugly around your arm, about 1 inch above the crease of your elbow. The cords should  be inside the groove of your elbow.  Sit quietly, with the cuff in place, for about 5 minutes. Then press the power button to start a reading. Do not talk or move while the reading is taking place.  Record your readings on a sheet of paper. Although most cuffs have a memory, it is often easier to see a pattern developing when the numbers are all in front of you.  You can repeat the reading after 1-3 minutes if it is recommended.   Make sure your bladder is empty and you have not had caffeine or tobacco within the last 30 minutes   Always bring your blood pressure log with you to your appointments. If you have not brought your monitor in to be double checked for accuracy, please bring it to your next appointment.   You can find a list of validated (accurate) blood pressure cuffs at: validatebp.org

## 2023-05-01 LAB — BASIC METABOLIC PANEL
BUN/Creatinine Ratio: 11 (ref 9–23)
BUN: 13 mg/dL (ref 6–24)
CO2: 22 mmol/L (ref 20–29)
Calcium: 10.3 mg/dL — ABNORMAL HIGH (ref 8.7–10.2)
Chloride: 103 mmol/L (ref 96–106)
Creatinine, Ser: 1.22 mg/dL — ABNORMAL HIGH (ref 0.57–1.00)
Glucose: 77 mg/dL (ref 70–99)
Potassium: 4.5 mmol/L (ref 3.5–5.2)
Sodium: 137 mmol/L (ref 134–144)
eGFR: 56 mL/min/{1.73_m2} — ABNORMAL LOW (ref >59)

## 2023-05-03 ENCOUNTER — Telehealth: Payer: Self-pay | Admitting: Pharmacist

## 2023-05-03 DIAGNOSIS — I1A0 Resistant hypertension: Secondary | ICD-10-CM

## 2023-05-03 NOTE — Telephone Encounter (Signed)
Called pt to discuss BMET results. Slight bump in SCr from baseline of 1.1 to 1.2 after initiating spironolactone, however BP improved significantly and is now very well controlled. Will continue current meds but advise pt to stay hydrated and will recheck in another month. If SCr remains elevated, can decrease her spironolactone from 25mg  to 12.5mg  daily.

## 2023-05-31 ENCOUNTER — Ambulatory Visit: Payer: Federal, State, Local not specified - PPO | Attending: Family Medicine

## 2023-06-01 ENCOUNTER — Ambulatory Visit: Payer: Federal, State, Local not specified - PPO | Admitting: Family Medicine

## 2023-06-01 VITALS — BP 128/80 | HR 100 | Resp 12 | Ht 66.0 in | Wt 223.2 lb

## 2023-06-01 DIAGNOSIS — K219 Gastro-esophageal reflux disease without esophagitis: Secondary | ICD-10-CM

## 2023-06-01 DIAGNOSIS — E039 Hypothyroidism, unspecified: Secondary | ICD-10-CM | POA: Diagnosis not present

## 2023-06-01 DIAGNOSIS — Z23 Encounter for immunization: Secondary | ICD-10-CM | POA: Diagnosis not present

## 2023-06-01 DIAGNOSIS — N938 Other specified abnormal uterine and vaginal bleeding: Secondary | ICD-10-CM

## 2023-06-01 DIAGNOSIS — E785 Hyperlipidemia, unspecified: Secondary | ICD-10-CM

## 2023-06-01 DIAGNOSIS — F3170 Bipolar disorder, currently in remission, most recent episode unspecified: Secondary | ICD-10-CM

## 2023-06-01 DIAGNOSIS — I10 Essential (primary) hypertension: Secondary | ICD-10-CM

## 2023-06-01 LAB — LIPID PANEL
Cholesterol: 210 mg/dL — ABNORMAL HIGH (ref 0–200)
HDL: 38.8 mg/dL — ABNORMAL LOW (ref >39.00)
LDL Cholesterol: 136 mg/dL — ABNORMAL HIGH (ref 0–99)
NonHDL: 171.44
Total CHOL/HDL Ratio: 5
Triglycerides: 178 mg/dL — ABNORMAL HIGH (ref 0.0–149.0)
VLDL: 35.6 mg/dL (ref 0.0–40.0)

## 2023-06-01 LAB — COMPREHENSIVE METABOLIC PANEL
ALT: 11 U/L (ref 0–35)
AST: 11 U/L (ref 0–37)
Albumin: 4.3 g/dL (ref 3.5–5.2)
Alkaline Phosphatase: 44 U/L (ref 39–117)
BUN: 13 mg/dL (ref 6–23)
CO2: 23 meq/L (ref 19–32)
Calcium: 10.2 mg/dL (ref 8.4–10.5)
Chloride: 106 meq/L (ref 96–112)
Creatinine, Ser: 1.06 mg/dL (ref 0.40–1.20)
GFR: 63.98 mL/min (ref >60.00)
Glucose, Bld: 94 mg/dL (ref 70–99)
Potassium: 4 meq/L (ref 3.5–5.1)
Sodium: 136 meq/L (ref 135–145)
Total Bilirubin: 0.3 mg/dL (ref 0.2–1.2)
Total Protein: 7.5 g/dL (ref 6.0–8.3)

## 2023-06-01 LAB — CBC
HCT: 39.9 % (ref 36.0–46.0)
Hemoglobin: 13 g/dL (ref 12.0–15.0)
MCHC: 32.6 g/dL (ref 30.0–36.0)
MCV: 92 fL (ref 78.0–100.0)
Platelets: 228 10*3/uL (ref 150.0–400.0)
RBC: 4.33 Mil/uL (ref 3.87–5.11)
RDW: 12.8 % (ref 11.5–15.5)
WBC: 10.4 10*3/uL (ref 4.0–10.5)

## 2023-06-01 LAB — T4, FREE: Free T4: 1.28 ng/dL (ref 0.60–1.60)

## 2023-06-01 LAB — TSH: TSH: 1.13 u[IU]/mL (ref 0.35–5.50)

## 2023-06-01 NOTE — Patient Instructions (Addendum)
A few things to remember from today's visit:  DUB (dysfunctional uterine bleeding) - Plan: Ambulatory referral to Obstetrics / Gynecology, CBC  Hypothyroidism, unspecified type - Plan: TSH, T4, free  Hyperlipidemia, unspecified hyperlipidemia type - Plan: Comprehensive metabolic panel, Lipid panel  Primary hypertension - Plan: Comprehensive metabolic panel No changes today.  If you need refills for medications you take chronically, please call your pharmacy. Do not use My Chart to request refills or for acute issues that need immediate attention. If you send a my chart message, it may take a few days to be addressed, specially if I am not in the office.  Please be sure medication list is accurate. If a new problem present, please set up appointment sooner than planned today.

## 2023-06-01 NOTE — Progress Notes (Signed)
Chief Complaint  Patient presents with   Pain    X 3 months, left area between shoulder blade & spine   lab work    Thyroid check   Referral    New gynecology referral   HPI: [name] is a 42 y.o. female with a PMHx significant for HTN, HLD, hypothyroidism, anxiety/bipolar disorder, who is here today for follow up and complaining of upper left back pain for 3 months.   Upper back pain:  She complains of upper back pain on the left side since 01/2023. She says the pain is constant,but changes in quality. She says right now, she would rate it as a 5/10, which she says is relatively good.  She doesn't remember any unusual activity or muscle injuries.  She says she has been using patches for her pain.  She has tried yoga and stretching, which has not helped.   She is afraid this may be related to gastric symptoms, but missed a recent appointment with gastroenterology, and couldn't get another appointment until 08/2023.  Dysphagia has improved, still has occasional episodes, heartburn. + Nausea and vomiting, intermittently for a few months. She takes pantoprazole 40 mg daily, and OTC Pepcid.  Hypothyroidism:  She is taking Synthroid 125 mcg 1.5 tabs Monday, Wednesday,and Fridays and 1 tab the rest of the days. . She has been taking it at the same time as her other medications.  Last TSH 6.3 in 01/2023.  Hypertension: She is currently taking amlodipine 10 mg daily, chlorthalidone 25 mg daily, spironolactone 25 mg daily, and valsartan 320 mg daily.  Checking BP at home: She checks her BP at home and says her readings have been normal, similar to readings today. She has been trying to reduce her salt intake.  She has been seeing pharmacist at her cardiologist's office regularly, and is scheduled with a new cardiologist in December.  Eye Exam: Her last eye exam was in 08/2022.   Negative for unusual or severe headache, visual changes, exertional chest pain, dyspnea, focal  weakness, or edema.  Lab Results  Component Value Date   CREATININE 1.22 (H) 04/30/2023   BUN 13 04/30/2023   NA 137 04/30/2023   K 4.5 04/30/2023   CL 103 04/30/2023   CO2 22 04/30/2023   Anxiety/Biplor disorders: She is no longer on pharmacologic treatment. She says she is stressed but doing ok, but is close to her baseline.  She has also not been following with her psychiatrist due to work schedule and difficulties with insurance coverage.  She has been working from home.   She says she has been trying to be active through moving around and doing daily chores.  She also says she is trying to eat healthier.  She is still smoking 0.5-1 ppd, depending of level of stress.  -She asks about getting a referral for a new gynecologist because she is unsatisfied with her current one.  She mentions her menstrual periods have been irregular lately, and she has been having frequent bleeding almost every day. Pelvic/transvaginal US revealed fibroids.  Review of Systems  Constitutional:  Negative for activity change, appetite change, chills and fever.  HENT:  Negative for mouth sores and sore throat.   Respiratory:  Negative for cough and wheezing.   Gastrointestinal:  Negative for abdominal pain.  Endocrine: Negative for cold intolerance and heat intolerance.  Genitourinary:  Negative for decreased urine volume, dysuria and hematuria.  Skin:  Negative for rash.  Neurological:  Negative for tremors,  syncope and facial asymmetry.  Psychiatric/Behavioral:  Positive for sleep disturbance. Negative for confusion and hallucinations.   See other pertinent positives and negatives in HPI.  Current Outpatient Medications on File Prior to Visit  Medication Sig Dispense Refill   amLODipine (NORVASC) 10 MG tablet Take 1 tablet (10 mg total) by mouth daily. 90 tablet 2   chlorthalidone (HYGROTON) 25 MG tablet Take 1 tablet (25 mg total) by mouth daily. 90 tablet 3   OVER THE COUNTER MEDICATION Take 1  tablet by mouth daily as needed. Sleep aid     pantoprazole (PROTONIX) 40 MG tablet Take 1 tablet (40 mg total) by mouth 2 (two) times daily. 180 tablet 3   ropinirole (REQUIP) 5 MG tablet Take 1 tablet (5 mg total) by mouth at bedtime. 90 tablet 2   spironolactone (ALDACTONE) 25 MG tablet Take 1 tablet (25 mg total) by mouth daily. 30 tablet 11   SYNTHROID 125 MCG tablet Take 1.5 tabs Monday, Wednesday,and Fridays. Continue 1 tab the rest of the days. 90 tablet 2   valsartan (DIOVAN) 320 MG tablet Take 1 tablet (320 mg total) by mouth daily. 90 tablet 3   No current facility-administered medications on file prior to visit.    Past Medical History:  Diagnosis Date   Anemia    Anxiety    postpartum   Depression    post partum   Gestational diabetes    diet controlled   Headache    "major issue in the past 4 years"   Hypercalcemia    Hyperlipidemia    Hypertension    not on high blood pressure meds anymore   Hypothyroidism    PE (pulmonary embolism) 2004   Postpartum care following vaginal delivery (11/15) 06/18/2014   Thyroid disease    hypothyroidism   Vitamin D deficiency    Allergies  Allergen Reactions   Ramipril Cough    Social History   Socioeconomic History   Marital status: Married    Spouse name: Jennifer Mcmahon   Number of children: 2   Years of education: 18   Highest education level: Master's degree (e.g., MA, MS, MEng, MEd, MSW, MBA)  Occupational History   Occupation: Careers adviser: [employer]   Occupation: unempolyed  Tobacco Use   Smoking status: Some Days    Current packs/day: 0.00    Types: Cigarettes    Last attempt to quit: 02/25/2008    Years since quitting: 15.2   Smokeless tobacco: Never   Tobacco comments:    Has stopped and started smoking for years. Some days no smoking, other days chain smoking  Vaping Use   Vaping status: Never Used  Substance and Sexual Activity   Alcohol use: Yes    Comment: social   Drug use: No   Sexual  activity: Yes    Birth control/protection: Condom  Other Topics Concern   Not on file  Social History Narrative   Patient is right-handed. She lives with her husband and two children in a one level home. She drinks multiple cups of caffeine daily (sources: coffee, soda, monster energy drink, tea). She walks her dog daily.   Social Determinants of Health   Financial Resource Strain: Low Risk  (06/01/2023)   Overall Financial Resource Strain (CARDIA)    Difficulty of Paying Living Expenses: Not hard at all  Food Insecurity: No Food Insecurity (06/01/2023)   Hunger Vital Sign    Worried About Running Out of Food in the Last Year:  Never true    Ran Out of Food in the Last Year: Never true  Transportation Needs: No Transportation Needs (06/01/2023)   PRAPARE - Administrator, Civil Service (Medical): No    Lack of Transportation (Non-Medical): No  Physical Activity: Unknown (06/01/2023)   Exercise Vital Sign    Days of Exercise per Week: Patient declined    Minutes of Exercise per Session: 20 min  Stress: Stress Concern Present (06/01/2023)   [employer] of Occupational Health - Occupational Stress Questionnaire    Feeling of Stress : Rather much  Social Connections: Unknown (06/01/2023)   Social Connection and Isolation Panel [NHANES]    Frequency of Communication with Friends and Family: Twice a week    Frequency of Social Gatherings with Friends and Family: Patient declined    Attends Religious Services: Patient declined    Database administrator or Organizations: No    Attends Engineer, structural: Not on file    Marital Status: Married   Today's Vitals   06/01/23 1132  BP: 128/80  Pulse: 100  Resp: 12  SpO2: 97%  Weight: 223 lb 4 oz (101.3 kg)  Height: 5\' 6"  (1.676 m)   Body mass index is 36.03 kg/m.  Physical Exam Vitals and nursing note reviewed.  Constitutional:      General: She is not in acute distress.    Appearance: She is  well-developed.  HENT:     Head: Normocephalic and atraumatic.     Mouth/Throat:     Mouth: Mucous membranes are moist.     Pharynx: Oropharynx is clear.  Eyes:     Conjunctiva/sclera: Conjunctivae normal.  Neck:     Thyroid: No thyroid mass.  Cardiovascular:     Rate and Rhythm: Normal rate and regular rhythm.     Pulses:          Dorsalis pedis pulses are 2+ on the right side and 2+ on the left side.     Heart sounds: No murmur heard. Pulmonary:     Effort: Pulmonary effort is normal. No respiratory distress.     Breath sounds: Normal breath sounds.  Abdominal:     Palpations: Abdomen is soft. There is no hepatomegaly or mass.     Tenderness: There is no abdominal tenderness.  Musculoskeletal:     Cervical back: Spasms present. No tenderness.     Thoracic back: Tenderness present.     Lumbar back: No tenderness.       Back:     Right lower leg: No edema.     Left lower leg: No edema.  Lymphadenopathy:     Cervical: No cervical adenopathy.  Skin:    General: Skin is warm.     Findings: No erythema or rash.  Neurological:     General: No focal deficit present.     Mental Status: She is alert and oriented to person, place, and time.     Cranial Nerves: No cranial nerve deficit.     Gait: Gait normal.  Psychiatric:     Comments: Well groomed, good eye contact.   ASSESSMENT AND PLAN:  Ms. Flammer was seen today for upper left back pain.   Orders Placed This Encounter  Procedures   Flu vaccine trivalent PF, 6mos and older(Flulaval,Afluria,Fluarix,Fluzone)   TSH   Comprehensive metabolic panel   Lipid panel   T4, free   CBC   Ambulatory referral to Obstetrics / Gynecology   Lab Results  Component Value  Date   TSH 1.13 06/01/2023   Lab Results  Component Value Date   WBC 10.4 06/01/2023   HGB 13.0 06/01/2023   HCT 39.9 06/01/2023   MCV 92.0 06/01/2023   PLT 228.0 06/01/2023   Lab Results  Component Value Date   NA 136 06/01/2023   CL 106 06/01/2023    K 4.0 06/01/2023   CO2 23 06/01/2023   BUN 13 06/01/2023   CREATININE 1.06 06/01/2023   GFR 63.98 06/01/2023   CALCIUM 10.2 06/01/2023   ALBUMIN 4.3 06/01/2023   GLUCOSE 94 06/01/2023   Lab Results  Component Value Date   CHOL 210 (H) 06/01/2023   HDL 38.80 (L) 06/01/2023   LDLCALC 136 (H) 06/01/2023   TRIG 178.0 (H) 06/01/2023   CHOLHDL 5 06/01/2023   Lab Results  Component Value Date   ALT 11 06/01/2023   AST 11 06/01/2023   ALKPHOS 44 06/01/2023   BILITOT 0.3 06/01/2023   The 10-year ASCVD risk score (Arnett DK, et al., 2019) is: 6.9%   Values used to calculate the score:     Age: 58 years     Sex: Female     Is Non-Hispanic African American: No     Diabetic: No     Tobacco smoker: Yes     Systolic Blood Pressure: 128 mmHg     Is BP treated: Yes     HDL Cholesterol: 38.8 mg/dL     Total Cholesterol: 210 mg/dL  DUB (dysfunctional uterine bleeding) Problem has been going on for a few months, hx of fibroids. Referral to gyn placed. Further recommendations will be given according to lab results.  -     Ambulatory referral to Obstetrics / Gynecology -     CBC; Future  Hypothyroidism, unspecified type Last TSH mildly abnormal, 6.1. Recommend taking medication 30-60 min before breakfast and with no other medication. Continue Levothyroxine 125 mcg daily. Further recommendations according to TSH result.  -     TSH; Future -     T4, free; Future  Hyperlipidemia, unspecified hyperlipidemia type Non pharmacologic treatment recommended for now. Further recommendations will be given according to 10 years CVD risk score and lipid panel numbers.  -     Comprehensive metabolic panel; Future -     Lipid panel; Future  Primary hypertension BP has improved, adequately controlled. Continue amlodipine 10 mg daily, chlorthalidone 25 mg daily, spironolactone 25 mg daily, and valsartan 320 mg daily.  Following with pharmacist and cardiologist.  -     Comprehensive metabolic  panel; Future  Need for influenza vaccination -     Flu vaccine trivalent PF, 6mos and older(Flulaval,Afluria,Fluarix,Fluzone)  Gastroesophageal reflux disease, unspecified whether esophagitis present  Problem has improved but not well controlled. EGD in 09/2022:Mild stenosis and hiatal hernia + mild esophageal inflammation. S/p dilation. Superficial gastric erosion on antrum.  Currently on Pantoprazole 40 mg daily and Pepcid 20 mg daily. Continue GERD precautions. Following with GI.  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 Reports symptoms are stable even after discontinuing medications.  Return in about 1 year (around 05/31/2024) for CPE.  I, Jennifer Mcmahon, acting as a scribe for Montia Haslip [place], MD., have documented all relevant documentation on the behalf of Jennifer Hyson [place], MD, as directed by  Jennifer Plascencia [place], MD while in the presence of Jennifer Jock [place], MD.   I, Jennifer Mcmahon, have reviewed all documentation for this  visit. The documentation on 06/01/23 for the exam, diagnosis, procedures, and orders are all accurate and complete.  Jennifer Bress G. [place], MD  [facility] [place] office.

## 2023-06-02 NOTE — Telephone Encounter (Signed)
F/u BMET checked at PCP office yesterday, renal function has returned to baseline and BP very well controlled at appt, no further management needed.

## 2023-06-03 ENCOUNTER — Encounter: Payer: Self-pay | Admitting: Family Medicine

## 2023-06-16 ENCOUNTER — Encounter: Payer: Self-pay | Admitting: Obstetrics and Gynecology

## 2023-06-16 ENCOUNTER — Ambulatory Visit: Payer: Federal, State, Local not specified - PPO | Admitting: Obstetrics and Gynecology

## 2023-06-16 VITALS — BP 113/70 | HR 95 | Ht 66.0 in | Wt 223.4 lb

## 2023-06-16 DIAGNOSIS — N939 Abnormal uterine and vaginal bleeding, unspecified: Secondary | ICD-10-CM

## 2023-06-16 NOTE — Progress Notes (Signed)
Pt presents for DUB. Pt reports bleeding bright red blood for 1 month in August. Bleeding stopped with Megace. Family cervical and ovarian cancer.   Last PAP 10/2022, Hx of abnormal PAP 15 years ago

## 2023-06-16 NOTE — Progress Notes (Signed)
GYNECOLOGY OFFICE NOTE  History:  42 y.o. Jennifer Mcmahon here today for abnormal uterine bleeding. Has monthly periods lasting 5-6 days, veyr very heavy, have always been heavy. Then bled for an entire month August-October. Got megace from prior OB/GYN which worked well. Took megace for 1 month and then stopped bc Rx ran out, then started spotting again after. Then had regular period (longer and weird flow) but it stopped, not currently bleeding.     Reports fibroids on US done at private office. Images/report not available.   Past Medical History:  Diagnosis Date   Anemia    Anxiety    postpartum   Depression    post partum   Gestational diabetes    diet controlled   Headache    "major issue in the past 4 years"   Hypercalcemia    Hyperlipidemia    Hypertension    not on high blood pressure meds anymore   Hypothyroidism    PE (pulmonary embolism) 2004   Postpartum care following vaginal delivery (11/15) 06/18/2014   Thyroid disease    hypothyroidism   Vitamin D deficiency     Past Surgical History:  Procedure Laterality Date   CHOLECYSTECTOMY N/A 06/12/2016   Procedure: LAPAROSCOPIC CHOLECYSTECTOMY;  Surgeon: Axel Filler, MD;  Location: [facility] OR;  Service: General;  Laterality: N/A;   EYE SURGERY     lasik   WISDOM TOOTH EXTRACTION      Current Outpatient Medications:    amLODipine (NORVASC) 10 MG tablet, Take 1 tablet (10 mg total) by mouth daily., Disp: 90 tablet, Rfl: 2   chlorthalidone (HYGROTON) 25 MG tablet, Take 1 tablet (25 mg total) by mouth daily., Disp: 90 tablet, Rfl: 3   OVER THE COUNTER MEDICATION, Take 1 tablet by mouth daily as needed. Sleep aid, Disp: , Rfl:    pantoprazole (PROTONIX) 40 MG tablet, Take 1 tablet (40 mg total) by mouth 2 (two) times daily., Disp: 180 tablet, Rfl: 3   ropinirole (REQUIP) 5 MG tablet, Take 1 tablet (5 mg total) by mouth at bedtime., Disp: 90 tablet, Rfl: 2   spironolactone (ALDACTONE) 25 MG tablet, Take 1 tablet (25 mg total)  by mouth daily., Disp: 30 tablet, Rfl: 11   SYNTHROID 125 MCG tablet, Take 1.5 tabs Monday, Wednesday,and Fridays. Continue 1 tab the rest of the days., Disp: 90 tablet, Rfl: 2   valsartan (DIOVAN) 320 MG tablet, Take 1 tablet (320 mg total) by mouth daily., Disp: 90 tablet, Rfl: 3  The following portions of the patient's history were reviewed and updated as appropriate: allergies, current medications, past family history, past medical history, past social history, past surgical history and problem list.   Review of Systems:  Pertinent items noted in HPI and remainder of comprehensive ROS otherwise negative.   Objective:  Physical Exam BP 113/70   Pulse 95   Ht 5\' 6"  (1.676 m)   Wt 223 lb 6.4 oz (101.3 kg)   LMP 05/31/2023   BMI 36.06 kg/m  CONSTITUTIONAL: Well-developed, well-nourished female in no acute distress.  HENT:  Normocephalic, atraumatic. External right and left ear normal. Oropharynx is clear and moist EYES: Conjunctivae and EOM are normal. Pupils are equal, round, and reactive to light. No scleral icterus.  NECK: Normal range of motion, supple, no masses SKIN: Skin is warm and dry. No rash noted. Not diaphoretic. No erythema. No pallor. NEUROLOGIC: Alert and oriented to person, place, and time. Normal reflexes, muscle tone coordination. No cranial nerve deficit noted. PSYCHIATRIC:  Normal mood and affect. Normal behavior. Normal judgment and thought content. CARDIOVASCULAR: Normal heart rate noted RESPIRATORY: Effort normal, no problems with respiration noted ABDOMEN: Soft, no distention noted.   PELVIC: Normal appearing external genitalia; normal appearing vaginal mucosa, visible cervical ectropion.  No abnormal discharge noted.   MUSCULOSKELETAL: Normal range of motion. No edema noted.  Exam done with chaperone present.  Labs and Imaging No results found.  Assessment & Plan:  1. Abnormal uterine bleeding (AUB) EMB performed, see note for details   Routine  preventative health maintenance measures emphasized. Please refer to After Visit Summary for other counseling recommendations.   Return in about 4 weeks (around 07/14/2023) for Followup.   Baldemar Lenis, MD, [facility] Attending Center for [facility])

## 2023-06-16 NOTE — Progress Notes (Signed)
ENDOMETRIAL BIOPSY      Jennifer Mcmahon is a 42 y.o. [id number] here for endometrial biopsy.  The indications for endometrial biopsy were reviewed.  Risks of the biopsy including cramping, bleeding, infection, uterine perforation, inadequate specimen and need for additional procedures were discussed. The patient states she understands and agrees to undergo procedure today. Consent was signed. Time out was performed.   Indications: AUB Urine HCG: negative  A bivalve speculum was placed into the vagina and the cervix was easily visualized and was prepped with Betadine x2. 6 mL 1% lidocaine injected at 4 and 8 o'clock. A single-toothed tenaculum was placed on the anterior lip of the cervix to stabilize it. The 3 mm pipelle was introduced into the endometrial cavity without difficulty to a depth of 9 cm, and a moderate amount of tissue was obtained and sent to pathology. This was repeated for a total of 3 passes. The instruments were removed from the patient's vagina. Minimal bleeding from the cervix at the tenaculum was noted.   The patient tolerated the procedure well. Routine post-procedure instructions were given to the patient.    Will base further management on results of biopsy.  Baldemar Lenis, MD, [facility]ending [facility] for [employer] [facility])

## 2023-06-17 ENCOUNTER — Other Ambulatory Visit (HOSPITAL_COMMUNITY)
Admission: RE | Admit: 2023-06-17 | Discharge: 2023-06-17 | Disposition: A | Discharge status: Home / Self Care | Payer: Federal, State, Local not specified - PPO | Source: Ambulatory Visit | Attending: Obstetrics and Gynecology | Admitting: Obstetrics and Gynecology

## 2023-06-17 DIAGNOSIS — N939 Abnormal uterine and vaginal bleeding, unspecified: Secondary | ICD-10-CM | POA: Insufficient documentation

## 2023-06-17 NOTE — Addendum Note (Signed)
Addended by: Jearld Adjutant on: 06/17/2023 10:40 AM   Modules accepted: Orders

## 2023-06-17 NOTE — Addendum Note (Signed)
Addended by: Jearld Adjutant on: 06/17/2023 12:24 PM   Modules accepted: Orders

## 2023-06-21 LAB — SURGICAL PATHOLOGY

## 2023-06-24 ENCOUNTER — Telehealth: Payer: Self-pay | Admitting: Licensed Clinical Social Worker

## 2023-06-24 NOTE — Telephone Encounter (Signed)
[facility] contacted patient on this date to provide [facility] intro and to schedule appointment. Patient declined services at this time.

## 2023-07-06 ENCOUNTER — Other Ambulatory Visit: Payer: Self-pay | Admitting: Family Medicine

## 2023-07-06 DIAGNOSIS — G2581 Restless legs syndrome: Secondary | ICD-10-CM

## 2023-07-09 ENCOUNTER — Ambulatory Visit (HOSPITAL_BASED_OUTPATIENT_CLINIC_OR_DEPARTMENT_OTHER): Payer: Federal, State, Local not specified - PPO | Admitting: Cardiovascular Disease

## 2023-07-09 NOTE — Progress Notes (Unsigned)
  Cardiology Office Note:  .   Date:  07/09/2023  ID:  Jennifer Mcmahon, DOB January 08, 1981, MRN [id number] PCP: Mcmahon, Jennifer G, MD  [facility] Providers Cardiologist:  None { Click to update primary MD,subspecialty MD or APP then REFRESH:1}   History of Present Illness: .   Jennifer Mcmahon is a 42 y.o. female with a history of tobacco abuse, gestational diabetes, anxiety, depression, and hyperlipidemia here for follow-up.  She previously saw Dr. Shari Prows 01/2023.  She saw her OB/GYN Dr. Cherly Hensen and blood pressures were elevated to the 200s.  She was started on amlodipine and HCTZ.  She initially developed hypertension during pregnancy and it improved postpartum but recurred.  She reported a strong family history of hypertension.  At her visit with Dr. Shari Prows her blood pressure was 148/102.  Amlodipine was increased and HCTZ was switched to chlorthalidone.  She was also started on valsartan.  She was referred for sleep study which has not been performed.  She follow-up with our pharmacist and was encouraged to switch from ibuprofen to acetaminophen and to reduce her caffeine intake.  Spironolactone was added and valsartan was increased.  With those changes blood pressure was 122/78 on average when she was last seen 04/2023.  She is also working to cut back on her smoking intake.          ROS:  As per HPI  Studies Reviewed: .        *** Risk Assessment/Calculations:   {Does this patient have ATRIAL FIBRILLATION?:[phone]} No BP recorded.  {Refresh Note OR Click here to enter BP  :1}***   STOP-Bang Score:  4  { Consider Dx Sleep Disordered Breathing or Sleep Apnea  ICD G47.33          :1}    Physical Exam:   VS:  There were no vitals taken for this visit. , BMI There is no height or weight on file to calculate BMI. GENERAL:  Well appearing HEENT: Pupils equal round and reactive, fundi not visualized, oral mucosa unremarkable NECK:  No jugular venous distention, waveform  within normal limits, carotid upstroke brisk and symmetric, no bruits, no thyromegaly LYMPHATICS:  No cervical adenopathy LUNGS:  Clear to auscultation bilaterally HEART:  RRR.  PMI not displaced or sustained,S1 and S2 within normal limits, no S3, no S4, no clicks, no rubs, *** murmurs ABD:  Flat, positive bowel sounds normal in frequency in pitch, no bruits, no rebound, no guarding, no midline pulsatile mass, no hepatomegaly, no splenomegaly EXT:  2 plus pulses throughout, no edema, no cyanosis no clubbing SKIN:  No rashes no nodules NEURO:  Cranial nerves II through XII grossly intact, motor grossly intact throughout PSYCH:  Cognitively intact, oriented to person place and time   ASSESSMENT AND PLAN: .   *** Assessment and Plan                 {Are you ordering a CV Procedure (e.g. stress test, cath, DCCV, TEE, etc)?   Press F2        :[id number]}  Dispo: ***  Signed, Chilton Si, MD

## 2023-07-14 ENCOUNTER — Encounter: Payer: Self-pay | Admitting: Obstetrics and Gynecology

## 2023-07-14 ENCOUNTER — Ambulatory Visit: Payer: Federal, State, Local not specified - PPO | Admitting: Obstetrics and Gynecology

## 2023-07-14 VITALS — BP 124/84 | HR 86 | Ht 66.0 in | Wt 224.0 lb

## 2023-07-14 DIAGNOSIS — Z712 Person consulting for explanation of examination or test findings: Secondary | ICD-10-CM

## 2023-07-14 MED ORDER — MEGESTROL ACETATE 40 MG PO TABS: 40.0000 mg | ORAL_TABLET | Freq: Two times a day (BID) | ORAL | 5 refills | Status: AC

## 2023-07-14 NOTE — Progress Notes (Signed)
42 yo P2 presenting as follow up to discuss test results of endometrial biopsy performed 06/17/23 for evaluation of AUB. Patient reports feeling well and without any new complaints  Past Medical History:  Diagnosis Date   Anemia    Anxiety    postpartum   Depression    post partum   Gestational diabetes    diet controlled   Headache    "major issue in the past 4 years"   Hypercalcemia    Hyperlipidemia    Hypertension    not on high blood pressure meds anymore   Hypothyroidism    PE (pulmonary embolism) 2004   Postpartum care following vaginal delivery (11/15) 06/18/2014   Thyroid disease    hypothyroidism   Vitamin D deficiency    Past Surgical History:  Procedure Laterality Date   CHOLECYSTECTOMY N/A 06/12/2016   Procedure: LAPAROSCOPIC CHOLECYSTECTOMY;  Surgeon: Axel Filler, MD;  Location: [facility] OR;  Service: General;  Laterality: N/A;   EYE SURGERY     lasik   WISDOM TOOTH EXTRACTION     Family History  Problem Relation Age of Onset   Cancer Mother        uterine and ovarian   Hypertension Mother    Varicose Veins Mother    Thyroid disease Mother    Hyperlipidemia Mother    Mental illness Mother    Tuberculosis Sister    Heart disease Maternal Grandmother    Varicose Veins Maternal Grandfather    Mental illness Maternal Grandfather    Cancer Paternal Grandmother    Bipolar disorder Paternal Grandfather    Colon cancer Neg Hx    Liver disease Neg Hx    Esophageal cancer Neg Hx    Social History   Tobacco Use   Smoking status: Some Days    Current packs/day: 0.00    Types: Cigarettes    Last attempt to quit: 02/25/2008    Years since quitting: 15.3   Smokeless tobacco: Never   Tobacco comments:    Has stopped and started smoking for years. Some days no smoking, other days chain smoking  Vaping Use   Vaping status: Never Used  Substance Use Topics   Alcohol use: Yes    Comment: social   Drug use: No   ROS See pertinent in HPI. All other systems  reviewed and non contributory Blood pressure 124/84, pulse 86, height 5\' 6"  (1.676 m), weight 224 lb (101.6 kg), last menstrual period 06/18/2023. GENERAL: Well-developed, well-nourished female in no acute distress.  NEURO: alert and oriented x 3  06/2023 endometrial biopsy results Benign proliferative endometrium without hyperplasia or malignancy  A/P 42 yo here for test results - Results reviewed with the patient with reassurance - Reviewed management options of AUB with patient including medical management with megace, Mirena IUD or surgical options. Patient desires to continue with Megace which has worked for her in the past - Rx provided - RTC in 2025 for annual exam with pap smear

## 2023-07-14 NOTE — Progress Notes (Signed)
42 y.o. GYN presents for Follow up of Endo Bx.

## 2023-07-15 ENCOUNTER — Other Ambulatory Visit: Payer: Self-pay | Admitting: Obstetrics and Gynecology

## 2023-07-16 ENCOUNTER — Ambulatory Visit (HOSPITAL_BASED_OUTPATIENT_CLINIC_OR_DEPARTMENT_OTHER): Payer: Federal, State, Local not specified - PPO | Admitting: Cardiology

## 2023-07-16 ENCOUNTER — Encounter (HOSPITAL_BASED_OUTPATIENT_CLINIC_OR_DEPARTMENT_OTHER): Payer: Self-pay | Admitting: Cardiology

## 2023-07-16 VITALS — BP 100/60 | HR 94 | Ht 66.0 in | Wt 228.0 lb

## 2023-07-16 DIAGNOSIS — Z8249 Family history of ischemic heart disease and other diseases of the circulatory system: Secondary | ICD-10-CM | POA: Diagnosis not present

## 2023-07-16 DIAGNOSIS — I10 Essential (primary) hypertension: Secondary | ICD-10-CM

## 2023-07-16 DIAGNOSIS — R072 Precordial pain: Secondary | ICD-10-CM

## 2023-07-16 DIAGNOSIS — E8881 Metabolic syndrome: Secondary | ICD-10-CM

## 2023-07-16 DIAGNOSIS — Z7189 Other specified counseling: Secondary | ICD-10-CM

## 2023-07-16 DIAGNOSIS — R079 Chest pain, unspecified: Secondary | ICD-10-CM | POA: Diagnosis not present

## 2023-07-16 DIAGNOSIS — Z01812 Encounter for preprocedural laboratory examination: Secondary | ICD-10-CM | POA: Diagnosis not present

## 2023-07-16 MED ORDER — METOPROLOL TARTRATE 100 MG PO TABS
100.0000 mg | ORAL_TABLET | Freq: Once | ORAL | 0 refills | Status: DC
Start: 2023-07-16 — End: 2024-05-31

## 2023-07-16 NOTE — Patient Instructions (Signed)
Medication Instructions:  No change today *If you need a refill on your cardiac medications before your next appointment, please call your pharmacy*   Lab Work: Kidney function 1-2 weeks before test. Come to the third floor of [facility]. If you have labs (blood work) drawn today and your tests are completely normal, you will receive your results only by: MyChart Message (if you have MyChart) OR A paper copy in the mail If you have any lab test that is abnormal or we need to change your treatment, we will call you to review the results.   Testing/Procedures: Coronary CT    Your cardiac CT will be scheduled at one of the below locations:   [facility] [address] [place], [place] [address] [phone]  If scheduled at [facility], please arrive at the [facility]'s Entrance (Entrance C2) of [facility] 30 minutes prior to test start time. You can use the FREE valet parking offered at entrance C (encouraged to control the heart rate for the test)  Proceed to the [facility] Radiology Department (first floor) to check-in and test prep.  All radiology patients and guests should use entrance C2 at [facility], accessed from [facility], even though the hospital's physical address listed is [address].     Please follow these instructions carefully (unless otherwise directed):  An IV will be required for this test and Nitroglycerin will be given.   On the Day of the Test: Drink plenty of water until 1 hour prior to the test. Do not eat any food 1 hour prior to test. You may take your regular medications prior to the test.  Take metoprolol (Lopressor) two hours prior to test. HOLD chlorthalidone, spironolactone, and valsartan the morning of the test. OK to take amlodipine. Patients who wear a continuous glucose monitor MUST remove the device prior to scanning. FEMALES- please wear underwire-free bra if  available, avoid dresses & tight clothing      After the Test: Drink plenty of water. After receiving IV contrast, you may experience a mild flushed feeling. This is normal. On occasion, you may experience a mild rash up to 24 hours after the test. This is not dangerous. If this occurs, you can take Benadryl 25 mg and increase your fluid intake. If you experience trouble breathing, this can be serious. If it is severe call 911 IMMEDIATELY. If it is mild, please call our office.  We will call to schedule your test 2-4 weeks out understanding that some insurance companies will need an authorization prior to the service being performed.   For more information and frequently asked questions, please visit our website : [email or web address]  For non-scheduling related questions, please contact the cardiac imaging nurse navigator should you have any questions/concerns: Cardiac Imaging Nurse Navigators Direct Office Dial: [phone]   For scheduling needs, including cancellations and rescheduling, please call [place], [phone].     Follow-Up: At [facility], you and your health needs are our priority.  As part of our continuing mission to provide you with exceptional heart care, we have created designated Provider Care Teams.  These Care Teams include your primary Cardiologist (physician) and Advanced Practice Providers (APPs -  Physician Assistants and Nurse Practitioners) who all work together to provide you with the care you need, when you need it.  We recommend signing up for the patient portal called "MyChart".  Sign up information is provided on this After Visit Summary.  MyChart is used to connect with patients for Virtual Visits (Telemedicine).  Patients are able to view lab/test results, encounter notes, upcoming appointments, etc.  Non-urgent messages can be sent to your provider as well.   To learn more about what you can do with MyChart, go to  [email or web address].    Your next appointment:   6 week(s)  Provider:   Jodelle Red, MD    Other Instructions If you aren't eating/drinking well, or if you have a lot of diarrhea/vomiting, do not take the chlorthalidone that day.

## 2023-07-16 NOTE — Progress Notes (Signed)
Cardiology Office Note:  .   Date:  07/25/2023  ID:  Jennifer Mcmahon, DOB Dec 20, 1980, MRN [id number] PCP: [place], Betty G, MD  [facility] Providers Cardiologist:  Jodelle Red, MD {  History of Present Illness: .   Jennifer Mcmahon is a 42 y.o. female with a history of tobacco abuse, gestational diabetes, anxiety, depression, and hyperlipidemia here for follow-up.  She previously saw Dr. Shari Prows and established care with me on 07/16/23.    CV history: She saw her OB/GYN Dr. Cherly Hensen and blood pressures were elevated to the 200s.  She was started on amlodipine and HCTZ.  She initially developed hypertension during pregnancy and it improved postpartum but recurred.  She reported a strong family history of hypertension.  At her visit with Dr. Shari Prows her blood pressure was 148/102.  Amlodipine was increased and HCTZ was switched to chlorthalidone.  She was also started on valsartan.  She was referred for sleep study which has not been performed.  She follow-up with our pharmacist and was encouraged to switch from ibuprofen to acetaminophen and to reduce her caffeine intake.  Spironolactone was added and valsartan was increased.  With those changes blood pressure was 122/78 on average when she was last seen 04/2023.  She is also working to cut back on her smoking intake.  Today: Has been feeling poorly, has chronic GI issues and these have been worse over the last week. Checks BP at home, forgot log today. Generally 120s/80s on average recently. Today is the lowest she has seen.   We discussed her long term CV risk, between her family history (multiple people in her family with MI around age 25), gestational diabetes, lifestyle, hypertension, and smoking history. Reviewed below.   Has constant chest discomfort, not exertional, but has significant risk factors. Has not ever been evaluated for this from a cardiac perspective. Has left arm tingling and sometimes radiation to her  back.  ROS: Denies shortness of breath at rest or with normal exertion. No PND, orthopnea, LE edema or unexpected weight gain. No syncope or palpitations. ROS otherwise negative except as noted.   Studies Reviewed: Marland Kitchen    EKG:       Physical Exam:   VS:  BP 100/60 (BP Location: Left Arm, Patient Position: Sitting, Cuff Size: Large)   Pulse 94   Ht 5\' 6"  (1.676 m)   Wt 228 lb (103.4 kg)   LMP 06/18/2023 (Exact Date)   SpO2 97%   BMI 36.80 kg/m    Wt Readings from Last 3 Encounters:  07/16/23 228 lb (103.4 kg)  07/14/23 224 lb (101.6 kg)  06/16/23 223 lb 6.4 oz (101.3 kg)    GEN: Well nourished, well developed in no acute distress HEENT: Normal, moist mucous membranes NECK: No JVD CARDIAC: regular rhythm, normal S1 and S2, no rubs or gallops. No murmur. VASCULAR: Radial and DP pulses 2+ bilaterally. No carotid bruits RESPIRATORY:  Clear to auscultation without rales, wheezing or rhonchi  ABDOMEN: Soft, non-tender, non-distended MUSCULOSKELETAL:  Ambulates independently SKIN: Warm and dry, no edema NEUROLOGIC:  Alert and oriented x 3. No focal neuro deficits noted. PSYCHIATRIC:  Normal affect    ASSESSMENT AND PLAN: .    Chest pain -discussed treadmill stress, nuclear stress/lexiscan, and CT coronary angiography. Discussed pros and cons of each, including but not limited to false positive/false negative risk, radiation risk, and risk of IV contrast dye. Based on shared decision making, decision was made to pursue CT coronary angiography. -will  give one time dose of metoprolol 2 hours prior to scheduled test -counseled on need to get BMET prior to test -counseled on use of sublingual nitroglycerin and its importance to a good test -reviewed red flag warning signs that need immediate medical attention   Hypertension -well controlled today -currently on amlodipine, chlorthalidone, spironolactone, valsartan  Tobacco abuse -currently 1/2-1 ppd, precontemplative  History of  gestational diabetes Obesity Family history of heart disease Metabolic syndrome Mixed hyperlipidemia -discussed this in detail today, including increased CV risk long term -discussed biology, genetics, lifestyle, and medical management of this -will use results of CT to guide management -would be a good GLP1 candidate, discuss at follow up  CV risk counseling and prevention -recommend heart healthy/Mediterranean diet, with whole grains, fruits, vegetable, fish, lean meats, nuts, and olive oil. Limit salt. -recommend moderate walking, 3-5 times/week for 30-50 minutes each session. Aim for at least 150 minutes.week. Goal should be pace of 3 miles/hours, or walking 1.5 miles in 30 minutes -recommend avoidance of tobacco products. Avoid excess alcohol. -ASCVD risk score: The 10-year ASCVD risk score (Arnett DK, et al., 2019) is: 4.2%   Values used to calculate the score:     Age: 63 years     Sex: Female     Is Non-Hispanic African American: No     Diabetic: No     Tobacco smoker: Yes     Systolic Blood Pressure: 100 mmHg     Is BP treated: Yes     HDL Cholesterol: 38.8 mg/dL     Total Cholesterol: 210 mg/dL    Dispo: 6-8 weeks   Total time of encounter: I spent 42 minutes dedicated to the care of this patient on the date of this encounter to include pre-visit review of records, face-to-face time with the patient discussing conditions above, and clinical documentation with the electronic health record. We specifically spent time today discussing symptoms, options for further evaluation and management, CV risk counseling  Signed, Jodelle Red, MD   Jodelle Red, MD, PhD, [facility]  [facility]  [place]  Heart & Vascular at [facility] at [facility] [address], [address] [place], [place] [address] [phone]

## 2023-07-17 LAB — BASIC METABOLIC PANEL
BUN/Creatinine Ratio: 14 (ref 9–23)
BUN: 14 mg/dL (ref 6–24)
CO2: 21 mmol/L (ref 20–29)
Calcium: 10.8 mg/dL — ABNORMAL HIGH (ref 8.7–10.2)
Chloride: 104 mmol/L (ref 96–106)
Creatinine, Ser: 1 mg/dL (ref 0.57–1.00)
Glucose: 101 mg/dL — ABNORMAL HIGH (ref 70–99)
Potassium: 5 mmol/L (ref 3.5–5.2)
Sodium: 138 mmol/L (ref 134–144)
eGFR: 71 mL/min/{1.73_m2} (ref >59)

## 2023-07-26 ENCOUNTER — Encounter (HOSPITAL_COMMUNITY): Payer: Self-pay

## 2023-07-29 ENCOUNTER — Ambulatory Visit (HOSPITAL_COMMUNITY)
Admission: RE | Admit: 2023-07-29 | Discharge: 2023-07-29 | Disposition: A | Discharge status: Home / Self Care | Payer: Federal, State, Local not specified - PPO | Source: Ambulatory Visit | Attending: Cardiology | Admitting: Cardiology

## 2023-07-29 DIAGNOSIS — R079 Chest pain, unspecified: Secondary | ICD-10-CM | POA: Diagnosis not present

## 2023-07-29 DIAGNOSIS — R072 Precordial pain: Secondary | ICD-10-CM | POA: Insufficient documentation

## 2023-07-29 MED ORDER — NITROGLYCERIN 0.4 MG SL SUBL
SUBLINGUAL_TABLET | SUBLINGUAL | Status: AC
  Filled 2023-07-29: qty 2

## 2023-07-29 MED ORDER — NITROGLYCERIN 0.4 MG SL SUBL
0.8000 mg | SUBLINGUAL_TABLET | Freq: Once | SUBLINGUAL | Status: AC
  Administered 2023-07-29: 0.8 mg via SUBLINGUAL

## 2023-07-29 MED ORDER — IOHEXOL 350 MG/ML SOLN
95.0000 mL | Freq: Once | INTRAVENOUS | Status: AC | PRN
  Administered 2023-07-29: 95 mL via INTRAVENOUS

## 2023-08-05 ENCOUNTER — Encounter (HOSPITAL_BASED_OUTPATIENT_CLINIC_OR_DEPARTMENT_OTHER): Payer: Self-pay

## 2023-08-05 MED ORDER — ROSUVASTATIN CALCIUM 10 MG PO TABS: 10.0000 mg | ORAL_TABLET | Freq: Every day | ORAL | 1 refills | Status: DC

## 2023-08-25 ENCOUNTER — Ambulatory Visit: Payer: Federal, State, Local not specified - PPO | Admitting: Internal Medicine

## 2023-08-25 ENCOUNTER — Encounter: Payer: Self-pay | Admitting: Internal Medicine

## 2023-08-25 VITALS — BP 118/70 | HR 95 | Ht 66.0 in | Wt 220.0 lb

## 2023-08-25 DIAGNOSIS — E669 Obesity, unspecified: Secondary | ICD-10-CM

## 2023-08-25 DIAGNOSIS — R1110 Vomiting, unspecified: Secondary | ICD-10-CM | POA: Diagnosis not present

## 2023-08-25 DIAGNOSIS — R112 Nausea with vomiting, unspecified: Secondary | ICD-10-CM

## 2023-08-25 DIAGNOSIS — R1310 Dysphagia, unspecified: Secondary | ICD-10-CM

## 2023-08-25 DIAGNOSIS — K219 Gastro-esophageal reflux disease without esophagitis: Secondary | ICD-10-CM

## 2023-08-25 DIAGNOSIS — Z8719 Personal history of other diseases of the digestive system: Secondary | ICD-10-CM

## 2023-08-25 DIAGNOSIS — F17210 Nicotine dependence, cigarettes, uncomplicated: Secondary | ICD-10-CM

## 2023-08-25 DIAGNOSIS — Z6835 Body mass index (BMI) 35.0-35.9, adult: Secondary | ICD-10-CM

## 2023-08-25 DIAGNOSIS — F419 Anxiety disorder, unspecified: Secondary | ICD-10-CM

## 2023-08-25 MED ORDER — ONDANSETRON HCL 4 MG PO TABS: 4.0000 mg | ORAL_TABLET | ORAL | 1 refills | Status: AC | PRN

## 2023-08-25 MED ORDER — PANTOPRAZOLE SODIUM 40 MG PO TBEC: 40.0000 mg | DELAYED_RELEASE_TABLET | Freq: Two times a day (BID) | ORAL | 3 refills | Status: AC

## 2023-08-25 NOTE — Progress Notes (Signed)
HISTORY OF PRESENT ILLNESS:  Jennifer Mcmahon is a 43 y.o. female  who was initially evaluated in this office September 28, 2022 regarding GERD, globus sensation, intermittent solid food dysphagia as well as atypical dysphagia, bloating, and hypertension.  See that dictation for details.  She was advised with regards to reflux precautions, weight loss, and tobacco cessation.  Continued on pantoprazole 40 mg daily.  And set up for upper endoscopy which was performed September 30, 2022.  She was found to have a benign distal esophageal stricture as well as mild reflux esophagitis.  Few superficial gastric erosions noted.  Otherwise normal.  Biopsies of the gastric antrum were unremarkable.  No H. pylori.  She was continued on PPI.     She was last seen in follow-up Dec 29, 2022.  At that time, her intermittent solid food dysphagia has improved.  She described some breakthrough reflux symptoms on once daily PPI.  We increased her PPI to twice daily.  This helped.  Her issues with hypertension have improved on medical therapy.  She underwent thorough cardiology evaluation.  She was advised with regards to reflux precautions, weight loss, discontinuation of smoking, and follow-up at this time.  Patient tells me that she will occasionally have breakthrough reflux symptoms for which she will take antacids without much relief.  She also reports infrequent, but troublesome episodes of vomiting, particularly at night.  Last such episode November 2024.  She still has infrequent occasional loose stools with dietary indiscretion.  Unchanged.  No bleeding.  She inquires about routine screening colonoscopy.  She is pleased to report that she is working at a new job.  Furthermore, she can accomplish this remotely, which helps in terms of her children.  She has had no significant change in her weight and continues to smoke.    REVIEW OF SYSTEMS:  All non-GI ROS negative unless otherwise stated in the HPI except for sinus  and allergy trouble, anxiety, fatigue, headaches, menstrual pain, muscle cramps, sleeping problems, urinary leakage  Past Medical History:  Diagnosis Date   Anemia    Anxiety    postpartum   Depression    post partum   Gestational diabetes    diet controlled   Headache    "major issue in the past 4 years"   Hypercalcemia    Hyperlipidemia    Hypertension    not on high blood pressure meds anymore   Hypothyroidism    PE (pulmonary embolism) 2004   Postpartum care following vaginal delivery (11/15) 06/18/2014   Thyroid disease    hypothyroidism   Vitamin D deficiency     Past Surgical History:  Procedure Laterality Date   CHOLECYSTECTOMY N/A 06/12/2016   Procedure: LAPAROSCOPIC CHOLECYSTECTOMY;  Surgeon: Axel Filler, MD;  Location: [facility] OR;  Service: General;  Laterality: N/A;   EYE SURGERY     lasik   WISDOM TOOTH EXTRACTION      Social History Jennifer Mcmahon  reports that she has been smoking cigarettes. She has never used smokeless tobacco. She reports current alcohol use. She reports that she does not use drugs.  family history includes Bipolar disorder in her paternal grandfather; Cancer in her mother and paternal grandmother; Heart disease in her maternal grandmother; Hyperlipidemia in her mother; Hypertension in her mother; Mental illness in her maternal grandfather and mother; Thyroid disease in her mother; Tuberculosis in her sister; Varicose Veins in her maternal grandfather and mother.  Allergies  Allergen Reactions   Ramipril Cough  PHYSICAL EXAMINATION: Vital signs: BP 118/70   Pulse 95   Ht 5\' 6"  (1.676 m)   Wt 220 lb (99.8 kg)   BMI 35.51 kg/m   Constitutional: generally well-appearing, no acute distress Psychiatric: alert and oriented x3, cooperative Eyes: extraocular movements intact, anicteric, conjunctiva pink Mouth: oral pharynx moist, no lesions Neck: supple no lymphadenopathy Cardiovascular: heart regular rate and rhythm, no  murmur Lungs: clear to auscultation bilaterally Abdomen: soft, nontender, nondistended, no obvious ascites, no peritoneal signs, normal bowel sounds, no organomegaly Rectal: Omitted Extremities: no clubbing, cyanosis, or lower extremity edema bilaterally Skin: no lesions on visible extremities Neuro: No focal deficits.  Cranial nerves intact  ASSESSMENT:  1.  GERD.  Requires twice daily PPI to control symptoms.  Occasional breakthrough as described 2.  History of esophageal stricture status post dilation.  No recurrent solid food dysphagia 3.  Episodic vomiting.  Likely related to reflux. 4.  Obesity 5.  Chronic tobacco use 6.  Anxiety 7.  Colon cancer screening.  Upcoming   PLAN:  1.  Reflux precautions with attention to weight loss. 2.  Small meal size 3.  Stop smoking 4.  Refill pantoprazole 40 mg twice daily.  Medication risks reviewed 5.  Prescribed Zofran 4 mg p.o. every 4 hours as needed nausea 6.  Routine GI follow-up 1 year.  She will be due for screening colonoscopy Total, 30 minutes spent preparing to see the patient, obtaining interval history, performing medically appropriate physical exam, counseling educating the patient regarding above listed issues, ordering multiple medications, defining follow-up, and documenting clinical information in the health record.

## 2023-08-25 NOTE — Patient Instructions (Signed)
We have sent the following medications to your pharmacy for you to pick up at your convenience: Protonix, Zofran  Please follow up in one year  _______________________________________________________  If your blood pressure at your visit was 140/90 or greater, please contact your primary care physician to follow up on this.  _______________________________________________________  If you are age 45 or older, your body mass index should be between 23-30. Your Body mass index is 35.51 kg/m. If this is out of the aforementioned range listed, please consider follow up with your Primary Care Provider.  If you are age 27 or younger, your body mass index should be between 19-25. Your Body mass index is 35.51 kg/m. If this is out of the aformentioned range listed, please consider follow up with your Primary Care Provider.   ________________________________________________________  The [place] [facility] providers would like to encourage you to use [facility] to communicate with providers for non-urgent requests or questions.  Due to long hold times on the telephone, sending your provider a message by [facility] may be a faster and more efficient way to get a response.  Please allow 48 business hours for a response.  Please remember that this is for non-urgent requests.  _______________________________________________________

## 2023-08-30 ENCOUNTER — Ambulatory Visit (HOSPITAL_BASED_OUTPATIENT_CLINIC_OR_DEPARTMENT_OTHER): Payer: Federal, State, Local not specified - PPO | Admitting: Cardiology

## 2023-10-06 ENCOUNTER — Other Ambulatory Visit: Payer: Self-pay

## 2023-10-06 DIAGNOSIS — R9431 Abnormal electrocardiogram [ECG] [EKG]: Secondary | ICD-10-CM

## 2023-10-06 DIAGNOSIS — I1A0 Resistant hypertension: Secondary | ICD-10-CM

## 2023-10-06 DIAGNOSIS — I517 Cardiomegaly: Secondary | ICD-10-CM

## 2023-10-06 DIAGNOSIS — R0683 Snoring: Secondary | ICD-10-CM

## 2023-10-06 MED ORDER — AMLODIPINE BESYLATE 10 MG PO TABS: 10.0000 mg | ORAL_TABLET | Freq: Every day | ORAL | 2 refills | Status: DC

## 2023-10-28 DIAGNOSIS — Z1231 Encounter for screening mammogram for malignant neoplasm of breast: Secondary | ICD-10-CM | POA: Diagnosis not present

## 2023-10-28 LAB — HM MAMMOGRAPHY

## 2023-12-07 ENCOUNTER — Other Ambulatory Visit: Payer: Self-pay | Admitting: Family Medicine

## 2023-12-08 ENCOUNTER — Ambulatory Visit (HOSPITAL_BASED_OUTPATIENT_CLINIC_OR_DEPARTMENT_OTHER): Payer: Federal, State, Local not specified - PPO | Admitting: Cardiology

## 2023-12-21 ENCOUNTER — Other Ambulatory Visit (HOSPITAL_COMMUNITY): Payer: Self-pay

## 2023-12-21 ENCOUNTER — Telehealth: Payer: Self-pay

## 2023-12-21 NOTE — Telephone Encounter (Signed)
 Pharmacy Patient Advocate Encounter   Received notification from CoverMyMeds that prior authorization for Pantoprazole  Sodium 40MG  dr tablets is required/requested.   Insurance verification completed.   The patient is insured through [facility] .   Prior Authorization for Pantoprazole  Sodium 40MG  dr tablets has been APPROVED from 11-21-2023 to 12-20-2024   PA #/Case ID/Reference #: [id number]

## 2024-01-03 ENCOUNTER — Other Ambulatory Visit: Payer: Self-pay

## 2024-01-03 DIAGNOSIS — I517 Cardiomegaly: Secondary | ICD-10-CM

## 2024-01-03 DIAGNOSIS — R0683 Snoring: Secondary | ICD-10-CM

## 2024-01-03 DIAGNOSIS — R9431 Abnormal electrocardiogram [ECG] [EKG]: Secondary | ICD-10-CM

## 2024-01-03 DIAGNOSIS — I1A0 Resistant hypertension: Secondary | ICD-10-CM

## 2024-01-03 MED ORDER — VALSARTAN 320 MG PO TABS: 320.0000 mg | ORAL_TABLET | Freq: Every day | ORAL | 2 refills | Status: AC

## 2024-01-11 ENCOUNTER — Other Ambulatory Visit: Payer: Self-pay | Admitting: *Deleted

## 2024-01-11 DIAGNOSIS — I1A0 Resistant hypertension: Secondary | ICD-10-CM

## 2024-01-11 DIAGNOSIS — R9431 Abnormal electrocardiogram [ECG] [EKG]: Secondary | ICD-10-CM

## 2024-01-11 DIAGNOSIS — I517 Cardiomegaly: Secondary | ICD-10-CM

## 2024-01-11 DIAGNOSIS — R0683 Snoring: Secondary | ICD-10-CM

## 2024-01-11 MED ORDER — CHLORTHALIDONE 25 MG PO TABS: 25.0000 mg | ORAL_TABLET | Freq: Every day | ORAL | 0 refills | Status: AC

## 2024-03-23 ENCOUNTER — Ambulatory Visit (HOSPITAL_BASED_OUTPATIENT_CLINIC_OR_DEPARTMENT_OTHER): Admitting: Cardiology

## 2024-04-22 ENCOUNTER — Other Ambulatory Visit (HOSPITAL_BASED_OUTPATIENT_CLINIC_OR_DEPARTMENT_OTHER): Payer: Self-pay | Admitting: Family

## 2024-05-24 ENCOUNTER — Encounter: Payer: Self-pay | Admitting: Family Medicine

## 2024-05-24 ENCOUNTER — Ambulatory Visit: Admitting: Family Medicine

## 2024-05-24 ENCOUNTER — Ambulatory Visit: Payer: Self-pay

## 2024-05-24 VITALS — BP 118/78 | HR 79 | Temp 97.6°F | Resp 16 | Ht 66.0 in | Wt 203.4 lb

## 2024-05-24 DIAGNOSIS — R200 Anesthesia of skin: Secondary | ICD-10-CM | POA: Diagnosis not present

## 2024-05-24 DIAGNOSIS — M549 Dorsalgia, unspecified: Secondary | ICD-10-CM | POA: Diagnosis not present

## 2024-05-24 DIAGNOSIS — Z23 Encounter for immunization: Secondary | ICD-10-CM

## 2024-05-24 MED ORDER — CELECOXIB 100 MG PO CAPS: 100.0000 mg | ORAL_CAPSULE | Freq: Two times a day (BID) | ORAL | 0 refills | Status: AC

## 2024-05-24 MED ORDER — AMITRIPTYLINE HCL 25 MG PO TABS: 25.0000 mg | ORAL_TABLET | Freq: Every day | ORAL | 1 refills | Status: DC

## 2024-05-24 NOTE — Telephone Encounter (Signed)
 FYI Only or Action Required?: FYI only for provider.  Patient was last seen in primary care on 06/01/2023 by [place], Betty G, MD.  Called Nurse Triage reporting Shoulder Pain.  Symptoms began several years ago.  Interventions attempted: OTC medications: tylenol , ibuprofen , icy hot, tiger balm, lidocaine  patches.  Symptoms are: gradually worsening.  Triage Disposition: See Physician Within 24 Hours  Patient/caregiver understands and will follow disposition?: Yes  Copied from CRM [id number]. Topic: Clinical - Red Word Triage >> May 24, 2024 10:50 AM Viola FALCON wrote: Red Word that prompted transfer to Nurse Triage: Patient having left arm and shoulder pain Reason for Disposition  Numbness (i.e., loss of sensation) in hand or fingers  Answer Assessment - Initial Assessment Questions Pt with ongoing shoulder pain radiating down to the arm for the last few years. It comes and goes but has gotten significantly worse over the last few weeks. Numbness and tingling extending down the ulnar side of her arm with numbness into the ring and pinky fingers. Burning aching pain to the left shoulder blade   Tylenol  and ibuprofen , lidocaine  patches, icyhot rub/ tigerbalm patches- minimal relief. Has tried gabapentin for a migraine in the past and did not do well with the side effects.   ED/UC precautions given and understood. Patient will see PCP this afternoon.  1. ONSET: When did the pain start?  Used to be once in awhile, now it is flaring up more frequently and constantly 2. LOCATION: Where is the pain located?     Left shoulder and arm  3. PAIN: How bad is the pain? (Scale 1-10; or mild, moderate, severe)     6-7/10 right now  4. WORK OR EXERCISE: Has there been any recent work or exercise that involved this part of the body?     Denies any recent  5. CAUSE: What do you think is causing the shoulder pain?     Going on for years unsure cause 6. OTHER SYMPTOMS: Do you have any other  symptoms? (e.g., neck pain, swelling, rash, fever, numbness, weakness)     Denies fever, CP, SOB, color changes, temp changes.  Protocols used: Shoulder Pain-A-AH

## 2024-05-24 NOTE — Progress Notes (Signed)
 ACUTE VISIT Chief Complaint  Patient presents with   Acute Visit    Shoulder pain    Discussed the use of AI scribe software for clinical note transcription with the patient, who gave verbal consent to proceed.  History of Present Illness Jennifer Mcmahon is a 43 year old female with PMHx significant for migraine headaches, hypothyroidism,HTN , GERD, and RLS; who presents with left shoulder pain radiating to the left arm and numbness in the fingers.  She has been experiencing left shoulder pain for the past 8 to 12 months. Pain is like burning sensation, 5/10, that starts on left interscapular area to left shoulder and radiating down her left arm to the fourth and fifth fingers, which are numb. The pain worsens with movement, and is intermittent but progressively worsening over time.  She uses Tylenol  and topical lidocaine  patches for pain relief, which provide minimal relief. The pain sometimes causes nausea, when severe. She is sleeping with arm extended, otherwise her whole hand will get numb. Right-handed.  She was evaluated by neurologist In 2020, at that time she was having forearm and fingers numbness. she underwent an electromyography of the left arm: This is a normal study of the left upper extremity.  In particular, there is no evidence of carpal tunnel syndrome or cervical radiculopathy.   Stress exacerbates her symptoms, and she manages her posture and engages in stretching exercises to alleviate the pain. She works at [employer] and uses a Land to help manage her symptoms at work.  No hx of trauma or unusual activity that could have triggered the pain.  No cough, wheezing, or difficulty breathing.  Her sleep is affected by the pain, and she takes measures such as reducing caffeine intake and using pain relief methods to improve her sleep quality.  Lab Results  Component Value Date   TSH 1.13 06/01/2023   Lab Results  Component Value Date   NA 138 07/16/2023    CL 104 07/16/2023   K 5.0 07/16/2023   CO2 21 07/16/2023   BUN 14 07/16/2023   CREATININE 1.00 07/16/2023   EGFR 71 07/16/2023   CALCIUM  10.8 (H) 07/16/2023   ALBUMIN 4.3 06/01/2023   GLUCOSE 101 (H) 07/16/2023   Review of Systems  Constitutional:  Negative for activity change, appetite change, chills and fever.  HENT:  Negative for sore throat and trouble swallowing.   Cardiovascular:  Negative for chest pain.  Gastrointestinal:  Negative for abdominal pain and vomiting.  Endocrine: Negative for cold intolerance and heat intolerance.  Skin:  Negative for rash.  Neurological:  Negative for syncope, weakness and headaches.  Psychiatric/Behavioral:  Positive for sleep disturbance. Negative for confusion.   See other pertinent positives and negatives in HPI.  Current Outpatient Medications on File Prior to Visit  Medication Sig Dispense Refill   amLODipine  (NORVASC ) 10 MG tablet Take 1 tablet (10 mg total) by mouth daily. 90 tablet 2   chlorthalidone  (HYGROTON ) 25 MG tablet Take 1 tablet (25 mg total) by mouth daily. 90 tablet 0   megestrol  (MEGACE ) 40 MG tablet Take 1 tablet (40 mg total) by mouth 2 (two) times daily. Can increase to two tablets twice a day in the event of heavy bleeding 60 tablet 5   metoprolol  tartrate (LOPRESSOR ) 100 MG tablet Take 1 tablet (100 mg total) by mouth once for 1 dose. Take 2 hours prior to scheduled cardiac CT 1 tablet 0   ondansetron  (ZOFRAN ) 4 MG tablet Take 1 tablet (  4 mg total) by mouth every 4 (four) hours as needed for nausea or vomiting. 60 tablet 1   OVER THE COUNTER MEDICATION Take 1 tablet by mouth daily as needed. Sleep aid     pantoprazole  (PROTONIX ) 40 MG tablet Take 1 tablet (40 mg total) by mouth 2 (two) times daily. 180 tablet 3   ropinirole  (REQUIP ) 5 MG tablet TAKE 1 TABLET BY MOUTH EVERYDAY AT BEDTIME 90 tablet 3   rosuvastatin  (CRESTOR ) 10 MG tablet TAKE 1 TABLET BY MOUTH EVERY DAY 90 tablet 0   spironolactone  (ALDACTONE ) 25 MG tablet  Take 1 tablet (25 mg total) by mouth daily. 30 tablet 11   SYNTHROID  125 MCG tablet TAKE 1.5 TABS MONDAY, WEDNESDAY,AND FRIDAYS. CONTINUE 1 TAB THE REST OF THE DAYS. 90 tablet 1   valsartan  (DIOVAN ) 320 MG tablet Take 1 tablet (320 mg total) by mouth daily. 90 tablet 2   No current facility-administered medications on file prior to visit.    Past Medical History:  Diagnosis Date   Anemia    Anxiety    postpartum   Depression    post partum   Gestational diabetes    diet controlled   Headache    major issue in the past 4 years   Hypercalcemia    Hyperlipidemia    Hypertension    not on high blood pressure meds anymore   Hypothyroidism    PE (pulmonary embolism) 2004   Postpartum care following vaginal delivery (11/15) 06/18/2014   Thyroid  disease    hypothyroidism   Vitamin D  deficiency    Allergies  Allergen Reactions   Ramipril Cough    Social History   Socioeconomic History   Marital status: Married    Spouse name: Selinda   Number of children: 2   Years of education: 18   Highest education level: Master's degree (e.g., MA, MS, MEng, MEd, MSW, MBA)  Occupational History   Occupation: Careers adviser: [employer]   Occupation: unempolyed  Tobacco Use   Smoking status: Some Days    Current packs/day: 0.00    Types: Cigarettes    Last attempt to quit: 02/25/2008    Years since quitting: 16.2   Smokeless tobacco: Never   Tobacco comments:    Has stopped and started smoking for years. Some days no smoking, other days chain smoking  Vaping Use   Vaping status: Never Used  Substance and Sexual Activity   Alcohol use: Yes    Comment: social   Drug use: No   Sexual activity: Not Currently    Birth control/protection: Condom  Other Topics Concern   Not on file  Social History Narrative   Patient is right-handed. She lives with her husband and two children in a one level home. She drinks multiple cups of caffeine daily (sources: coffee, soda, monster  energy drink, tea). She walks her dog daily.   Social Drivers of Corporate investment banker Strain: Low Risk  (06/01/2023)   Overall Financial Resource Strain (CARDIA)    Difficulty of Paying Living Expenses: Not hard at all  Food Insecurity: No Food Insecurity (06/01/2023)   Hunger Vital Sign    Worried About Running Out of Food in the Last Year: Never true    Ran Out of Food in the Last Year: Never true  Transportation Needs: No Transportation Needs (06/01/2023)   PRAPARE - Administrator, Civil Service (Medical): No    Lack of Transportation (Non-Medical): No  Physical Activity: Unknown (06/01/2023)   Exercise Vital Sign    Days of Exercise per Week: Patient declined    Minutes of Exercise per Session: Not on file  Stress: Stress Concern Present (06/01/2023)   [employer] of Occupational Health - Occupational Stress Questionnaire    Feeling of Stress : Rather much  Social Connections: Unknown (06/01/2023)   Social Connection and Isolation Panel    Frequency of Communication with Friends and Family: Twice a week    Frequency of Social Gatherings with Friends and Family: Patient declined    Attends Religious Services: Patient declined    Database administrator or Organizations: No    Attends Engineer, structural: Not on file    Marital Status: Married   Vitals:   05/24/24 1511  BP: 118/78  Pulse: 79  Resp: 16  Temp: 97.6 F (36.4 C)  SpO2: 99%   Body mass index is 32.83 kg/m.  Physical Exam Vitals and nursing note reviewed.  Constitutional:      General: She is not in acute distress.    Appearance: She is well-developed. She is not ill-appearing.  HENT:     Head: Normocephalic and atraumatic.  Eyes:     Conjunctiva/sclera: Conjunctivae normal.  Cardiovascular:     Rate and Rhythm: Normal rate and regular rhythm.     Heart sounds: No murmur heard. Pulmonary:     Effort: Pulmonary effort is normal. No respiratory distress.     Breath  sounds: Normal breath sounds.  Musculoskeletal:     Cervical back: Spasms and tenderness present. No bony tenderness. Pain with movement present. Normal range of motion.     Thoracic back: Spasms and tenderness present.       Back:  Lymphadenopathy:     Cervical: No cervical adenopathy.  Skin:    General: Skin is warm.     Findings: No erythema or rash.  Neurological:     General: No focal deficit present.     Mental Status: She is alert and oriented to person, place, and time.     Gait: Gait normal.     Deep Tendon Reflexes:     Reflex Scores:      Patellar reflexes are 2+ on the right side and 2+ on the left side.    Comments: In general no significant different in UE's strength, left hand slightly decreased grip and muscle strength against resistance.  Psychiatric:        Mood and Affect: Mood and affect normal.    ASSESSMENT AND PLAN:  Ms. Heacox was seen for left upper back pain and LUE burning pain.  Upper back pain on left side Left interscapular and cervical pain for the past 8-12 months. Recommend Celebrex  100 mg bid x 7-10 days but if medication help, we can continue it once daily prn. We discussed side effects. Cervical MRI will be arranged.  -     Celecoxib ; Take 1 capsule (100 mg total) by mouth 2 (two) times daily for 10 days.  Dispense: 20 capsule; Refill: 0 -     MR CERVICAL SPINE WO CONTRAST; Future  Left upper extremity numbness We discussed possible etiologies. ? Radicular pain. We discussed treatment options, he does not want Gabapentin. She prefers to hold on blood work, has an appt next week for CPE. Agrees with trial of Amitriptyline  25 mg, starting with 1/2 tab at bedtime and can increase to whole tab in 7-10 days. Some side effects discussed.  -  Amitriptyline  HCl; Take 1 tablet (25 mg total) by mouth at bedtime.  Dispense: 30 tablet; Refill: 1 -     MR CERVICAL SPINE WO CONTRAST; Future  Need for immunization against influenza -     Flu  vaccine trivalent PF, 6mos and older(Flulaval,Afluria,Fluarix,Fluzone)  Return if symptoms worsen or fail to improve, for keep next appointment.  Keyonni Percival G. [place], MD  [facility]. [place] office.

## 2024-05-24 NOTE — Patient Instructions (Addendum)
 A few things to remember from today's visit:  Upper back pain on left side - Plan: celecoxib  (CELEBREX ) 100 MG capsule, MR Cervical Spine Wo Contrast  Left upper extremity numbness - Plan: amitriptyline  (ELAVIL ) 25 MG tablet, MR Cervical Spine Wo Contrast, CANCELED: MR Lumbar Spine Wo Contrast  Need for immunization against influenza - Plan: Flu vaccine trivalent PF, 6mos and older(Flulaval,Afluria,Fluarix,Fluzone)  Amitriptyline  25 mg started today, you can try 1/2 tab first an increase to whole tab in a week. Local massage. Celebrex  2 times daily for 7-10 days. Will plan on labs next visit.  If you need refills for medications you take chronically, please call your pharmacy. Do not use My Chart to request refills or for acute issues that need immediate attention. If you send a my chart message, it may take a few days to be addressed, specially if I am not in the office.  Please be sure medication list is accurate. If a new problem present, please set up appointment sooner than planned today.

## 2024-05-27 ENCOUNTER — Ambulatory Visit
Admission: RE | Admit: 2024-05-27 | Discharge: 2024-05-27 | Disposition: A | Discharge status: Home / Self Care | Source: Ambulatory Visit | Attending: Family Medicine | Admitting: Family Medicine

## 2024-05-27 DIAGNOSIS — R200 Anesthesia of skin: Secondary | ICD-10-CM

## 2024-05-27 DIAGNOSIS — M5010 Cervical disc disorder with radiculopathy, unspecified cervical region: Secondary | ICD-10-CM | POA: Diagnosis not present

## 2024-05-27 DIAGNOSIS — M4722 Other spondylosis with radiculopathy, cervical region: Secondary | ICD-10-CM | POA: Diagnosis not present

## 2024-05-27 DIAGNOSIS — M549 Dorsalgia, unspecified: Secondary | ICD-10-CM

## 2024-05-29 ENCOUNTER — Encounter (HOSPITAL_BASED_OUTPATIENT_CLINIC_OR_DEPARTMENT_OTHER): Payer: Self-pay

## 2024-05-31 ENCOUNTER — Ambulatory Visit: Admitting: Family Medicine

## 2024-05-31 ENCOUNTER — Encounter (HOSPITAL_BASED_OUTPATIENT_CLINIC_OR_DEPARTMENT_OTHER): Payer: Self-pay

## 2024-05-31 ENCOUNTER — Encounter (HOSPITAL_BASED_OUTPATIENT_CLINIC_OR_DEPARTMENT_OTHER): Payer: Self-pay | Admitting: Cardiology

## 2024-05-31 ENCOUNTER — Ambulatory Visit (HOSPITAL_BASED_OUTPATIENT_CLINIC_OR_DEPARTMENT_OTHER): Admitting: Cardiology

## 2024-05-31 ENCOUNTER — Encounter: Payer: Self-pay | Admitting: Family Medicine

## 2024-05-31 VITALS — BP 102/70 | HR 87 | Temp 98.8°F | Resp 16 | Ht 66.0 in | Wt 201.0 lb

## 2024-05-31 VITALS — BP 100/72 | HR 75 | Ht 66.0 in | Wt 202.9 lb

## 2024-05-31 DIAGNOSIS — Z13228 Encounter for screening for other metabolic disorders: Secondary | ICD-10-CM | POA: Diagnosis not present

## 2024-05-31 DIAGNOSIS — R200 Anesthesia of skin: Secondary | ICD-10-CM

## 2024-05-31 DIAGNOSIS — Z720 Tobacco use: Secondary | ICD-10-CM

## 2024-05-31 DIAGNOSIS — Z130 Encounter for screening for diseases of the blood and blood-forming organs and certain disorders involving the immune mechanism: Secondary | ICD-10-CM

## 2024-05-31 DIAGNOSIS — I1A0 Resistant hypertension: Secondary | ICD-10-CM | POA: Diagnosis not present

## 2024-05-31 DIAGNOSIS — I2510 Atherosclerotic heart disease of native coronary artery without angina pectoris: Secondary | ICD-10-CM

## 2024-05-31 DIAGNOSIS — E785 Hyperlipidemia, unspecified: Secondary | ICD-10-CM | POA: Diagnosis not present

## 2024-05-31 DIAGNOSIS — E8881 Metabolic syndrome: Secondary | ICD-10-CM

## 2024-05-31 DIAGNOSIS — E66811 Obesity, class 1: Secondary | ICD-10-CM

## 2024-05-31 DIAGNOSIS — Z1329 Encounter for screening for other suspected endocrine disorder: Secondary | ICD-10-CM

## 2024-05-31 DIAGNOSIS — E782 Mixed hyperlipidemia: Secondary | ICD-10-CM

## 2024-05-31 DIAGNOSIS — Z0000 Encounter for general adult medical examination without abnormal findings: Secondary | ICD-10-CM

## 2024-05-31 DIAGNOSIS — Z23 Encounter for immunization: Secondary | ICD-10-CM

## 2024-05-31 DIAGNOSIS — E6609 Other obesity due to excess calories: Secondary | ICD-10-CM

## 2024-05-31 DIAGNOSIS — E039 Hypothyroidism, unspecified: Secondary | ICD-10-CM

## 2024-05-31 DIAGNOSIS — Z5181 Encounter for therapeutic drug level monitoring: Secondary | ICD-10-CM

## 2024-05-31 DIAGNOSIS — Z6832 Body mass index (BMI) 32.0-32.9, adult: Secondary | ICD-10-CM

## 2024-05-31 DIAGNOSIS — Z1211 Encounter for screening for malignant neoplasm of colon: Secondary | ICD-10-CM

## 2024-05-31 DIAGNOSIS — G8929 Other chronic pain: Secondary | ICD-10-CM

## 2024-05-31 DIAGNOSIS — M549 Dorsalgia, unspecified: Secondary | ICD-10-CM

## 2024-05-31 DIAGNOSIS — M25512 Pain in left shoulder: Secondary | ICD-10-CM

## 2024-05-31 DIAGNOSIS — Z7189 Other specified counseling: Secondary | ICD-10-CM

## 2024-05-31 LAB — COMPREHENSIVE METABOLIC PANEL WITH GFR
ALT: 23 U/L (ref 0–35)
AST: 17 U/L (ref 0–37)
Albumin: 4.4 g/dL (ref 3.5–5.2)
Alkaline Phosphatase: 52 U/L (ref 39–117)
BUN: 13 mg/dL (ref 6–23)
CO2: 26 meq/L (ref 19–32)
Calcium: 10.4 mg/dL (ref 8.4–10.5)
Chloride: 108 meq/L (ref 96–112)
Creatinine, Ser: 0.98 mg/dL (ref 0.40–1.20)
GFR: 69.8 mL/min (ref >60.00)
Glucose, Bld: 98 mg/dL (ref 70–99)
Potassium: 4.3 meq/L (ref 3.5–5.1)
Sodium: 139 meq/L (ref 135–145)
Total Bilirubin: 0.3 mg/dL (ref 0.2–1.2)
Total Protein: 7.4 g/dL (ref 6.0–8.3)

## 2024-05-31 LAB — CBC
HCT: 41.4 % (ref 36.0–46.0)
Hemoglobin: 13.9 g/dL (ref 12.0–15.0)
MCHC: 33.5 g/dL (ref 30.0–36.0)
MCV: 92.7 fl (ref 78.0–100.0)
Platelets: 215 K/uL (ref 150.0–400.0)
RBC: 4.47 Mil/uL (ref 3.87–5.11)
RDW: 12.8 % (ref 11.5–15.5)
WBC: 8.7 K/uL (ref 4.0–10.5)

## 2024-05-31 LAB — LIPID PANEL
Cholesterol: 178 mg/dL (ref 0–200)
HDL: 56.2 mg/dL (ref >39.00)
LDL Cholesterol: 90 mg/dL (ref 0–99)
NonHDL: 121.91
Total CHOL/HDL Ratio: 3
Triglycerides: 160 mg/dL — ABNORMAL HIGH (ref 0.0–149.0)
VLDL: 32 mg/dL (ref 0.0–40.0)

## 2024-05-31 LAB — VITAMIN B12: Vitamin B-12: 247 pg/mL (ref 211–911)

## 2024-05-31 LAB — TSH: TSH: 0.1 u[IU]/mL — ABNORMAL LOW (ref 0.35–5.50)

## 2024-05-31 MED ORDER — CYCLOBENZAPRINE HCL 10 MG PO TABS: 5.0000 mg | ORAL_TABLET | Freq: Every day | ORAL | 1 refills | Status: DC

## 2024-05-31 NOTE — Patient Instructions (Addendum)
 A few things to remember from today's visit:  Routine general medical examination at a health care facility  Need for tetanus booster - Plan: Tdap vaccine greater than or equal to 45yo IM  Colon cancer screening  Screening for endocrine, metabolic and immunity disorder - Plan: Comprehensive metabolic panel with GFR  Hypothyroidism, unspecified type  Hyperlipidemia, unspecified hyperlipidemia type - Plan: Lipid panel, Comprehensive metabolic panel with GFR  Left upper extremity numbness - Plan: CBC, TSH, Vitamin B12, AMB referral to orthopedics  Chronic left shoulder pain - Plan: AMB referral to orthopedics, cyclobenzaprine (FLEXERIL) 10 MG tablet  Upper back pain on left side - Plan: cyclobenzaprine (FLEXERIL) 10 MG tablet  Flexeril 1/2-1 tab at bedtime for 14 days then as needed.  If you need refills for medications you take chronically, please call your pharmacy. Do not use My Chart to request refills or for acute issues that need immediate attention. If you send a my chart message, it may take a few days to be addressed, specially if I am not in the office.  Please be sure medication list is accurate. If a new problem present, please set up appointment sooner than planned today.  Health Maintenance, Female Adopting a healthy lifestyle and getting preventive care are important in promoting health and wellness. Ask your health care provider about: The right schedule for you to have regular tests and exams. Things you can do on your own to prevent diseases and keep yourself healthy. What should I know about diet, weight, and exercise? Eat a healthy diet  Eat a diet that includes plenty of vegetables, fruits, low-fat dairy products, and lean protein. Do not eat a lot of foods that are high in solid fats, added sugars, or sodium. Maintain a healthy weight Body mass index (BMI) is used to identify weight problems. It estimates body fat based on height and weight. Your health care  provider can help determine your BMI and help you achieve or maintain a healthy weight. Get regular exercise Get regular exercise. This is one of the most important things you can do for your health. Most adults should: Exercise for at least 150 minutes each week. The exercise should increase your heart rate and make you sweat (moderate-intensity exercise). Do strengthening exercises at least twice a week. This is in addition to the moderate-intensity exercise. Spend less time sitting. Even light physical activity can be beneficial. Watch cholesterol and blood lipids Have your blood tested for lipids and cholesterol at 45 years of age, then have this test every 5 years. Have your cholesterol levels checked more often if: Your lipid or cholesterol levels are high. You are older than 45 years of age. You are at high risk for heart disease. What should I know about cancer screening? Depending on your health history and family history, you may need to have cancer screening at various ages. This may include screening for: Breast cancer. Cervical cancer. Colorectal cancer. Skin cancer. Lung cancer. What should I know about heart disease, diabetes, and high blood pressure? Blood pressure and heart disease High blood pressure causes heart disease and increases the risk of stroke. This is more likely to develop in people who have high blood pressure readings or are overweight. Have your blood pressure checked: Every 3-5 years if you are 36-81 years of age. Every year if you are 44 years old or older. Diabetes Have regular diabetes screenings. This checks your fasting blood sugar level. Have the screening done: Once every three years after  age 64 if you are at a normal weight and have a low risk for diabetes. More often and at a younger age if you are overweight or have a high risk for diabetes. What should I know about preventing infection? Hepatitis B If you have a higher risk for hepatitis B,  you should be screened for this virus. Talk with your health care provider to find out if you are at risk for hepatitis B infection. Hepatitis C Testing is recommended for: Everyone born from [birth year] through 1965. Anyone with known risk factors for hepatitis C. Sexually transmitted infections (STIs) Get screened for STIs, including gonorrhea and chlamydia, if: You are sexually active and are younger than 45 years of age. You are older than 45 years of age and your health care provider tells you that you are at risk for this type of infection. Your sexual activity has changed since you were last screened, and you are at increased risk for chlamydia or gonorrhea. Ask your health care provider if you are at risk. Ask your health care provider about whether you are at high risk for HIV. Your health care provider may recommend a prescription medicine to help prevent HIV infection. If you choose to take medicine to prevent HIV, you should first get tested for HIV. You should then be tested every 3 months for as long as you are taking the medicine. Pregnancy If you are about to stop having your period (premenopausal) and you may become pregnant, seek counseling before you get pregnant. Take 400 to 800 micrograms (mcg) of folic acid every day if you become pregnant. Ask for birth control (contraception) if you want to prevent pregnancy. Osteoporosis and menopause Osteoporosis is a disease in which the bones lose minerals and strength with aging. This can result in bone fractures. If you are 69 years old or older, or if you are at risk for osteoporosis and fractures, ask your health care provider if you should: Be screened for bone loss. Take a calcium  or vitamin D  supplement to lower your risk of fractures. Be given hormone replacement therapy (HRT) to treat symptoms of menopause. Follow these instructions at home: Alcohol use Do not drink alcohol if: Your health care provider tells you not to  drink. You are pregnant, may be pregnant, or are planning to become pregnant. If you drink alcohol: Limit how much you have to: 0-1 drink a day. Know how much alcohol is in your drink. In the [place] one drink equals one 12 oz bottle of beer (355 mL), one 5 oz glass of wine (148 mL), or one 1 oz glass of hard liquor (44 mL). Lifestyle Do not use any products that contain nicotine or tobacco. These products include cigarettes, chewing tobacco, and vaping devices, such as e-cigarettes. If you need help quitting, ask your health care provider. Do not use street drugs. Do not share needles. Ask your health care provider for help if you need support or information about quitting drugs. General instructions Schedule regular health, dental, and eye exams. Stay current with your vaccines. Tell your health care provider if: You often feel depressed. You have ever been abused or do not feel safe at home. Summary Adopting a healthy lifestyle and getting preventive care are important in promoting health and wellness. Follow your health care provider's instructions about healthy diet, exercising, and getting tested or screened for diseases. Follow your health care provider's instructions on monitoring your cholesterol and blood pressure. This information is not intended to replace  advice given to you by your health care provider. Make sure you discuss any questions you have with your health care provider. Document Revised: 12/09/2020 Document Reviewed: 12/09/2020 Elsevier Patient Education  2024 [employer].

## 2024-05-31 NOTE — Progress Notes (Signed)
 "  Chief Complaint  Patient presents with   Annual Exam   Discussed the use of AI scribe software for clinical note transcription with the patient, who gave verbal consent to proceed.  History of Present Illness Jennifer Mcmahon is a 43 year old female with a PMHx significant for migraine headaches, hypothyroidism,HTN , GERD, and bipolar disorder; who presents for a routine physical exam. Last CPE: 06/24/22  She follows with her gynecologist regularly, last seen within the past year. Her menstrual history includes irregular periods with spotting approximately once every two months. She has not had a menstrual period in several months.  She smokes daily with no current attempts to quit and does not consume alcohol.  She reports low appetite, typically eating one substantial meal a day with some snacking, and engages in daily physical activity by walking about 30 minutes a day at work.  She sleeps approximately seven hours per night.  She is overdue for a dental check-up, having last seen a dentist five years ago, and is also due for an eye exam as her vision prescription has worsened.  She has not yet undergone colon cancer screening but planning on discussing it with her gastro.  Immunization History  Administered Date(s) Administered   Influenza Inj Mdck Quad Pf 04/27/2018   Influenza, Seasonal, Injecte, Preservative Fre 06/01/2023, 05/24/2024   Influenza,inj,Quad PF,6+ Mos 05/08/2013, 04/15/2015, 04/30/2016, 07/19/2017, 05/08/2019   Influenza-Unspecified 04/27/2018   Tdap 11/05/2002, 05/08/2013   Health Maintenance  Topic Date Due   COVID-19 Vaccine (1) Never done   Hepatitis B Vaccines 19-59 Average Risk (1 of 3 - 19+ 3-dose series) Never done   HPV VACCINES (1 - 3-dose SCDM series) Never done   Cervical Cancer Screening (HPV/Pap Cotest)  Never done   DTaP/Tdap/Td (3 - Td or Tdap) 05/09/2023   Colonoscopy  Never done   Mammogram  10/27/2025   Influenza Vaccine  Completed    Hepatitis C Screening  Completed   HIV Screening  Completed   Meningococcal B Vaccine  Aged Out   Pneumococcal Vaccine  Discontinued   HLD: She is on Rosuvastatin  10 mg daily. Lab Results  Component Value Date   CHOL 210 (H) 06/01/2023   HDL 38.80 (L) 06/01/2023   LDLCALC 136 (H) 06/01/2023   TRIG 178.0 (H) 06/01/2023   CHOLHDL 5 06/01/2023   Hypothyroidism: She is on Levothyroxine  125 mcg daily. Lab Results  Component Value Date   TSH 1.13 06/01/2023   HTN: Follows with cardiologist, saw her today. She is on Amlodipine  10 mg daily, valsartan  320 mg daily,chlorthalidone  25 mg daily, and Spironolactone  25 mg daily.  Lab Results  Component Value Date   NA 138 07/16/2023   CL 104 07/16/2023   K 5.0 07/16/2023   CO2 21 07/16/2023   BUN 14 07/16/2023   CREATININE 1.00 07/16/2023   EGFR 71 07/16/2023   CALCIUM  10.8 (H) 07/16/2023   ALBUMIN 4.3 06/01/2023   GLUCOSE 101 (H) 07/16/2023   She was seen on 05/24/24 for left shoulder and upper back pain as well as LUE numbness. Had cervical MRI on 05/30/24: 1. No acute findings or clear explanation for the patient's symptoms. 2. Mild to moderate spondylosis at C4-5, C5-6 and C6-7 as described.There is mild to moderate right and mild left foraminal narrowing at C5-6 and mild foraminal narrowing bilaterally at C6-7. 3. No significant central spinal stenosis or evidence of cord deformity.  She tried Amitriptyline  for cervical radiculopathy but discontinued it due to severe  drowsiness and feeling 'disconnected'. She tolerates Celebrex  well. She wonders if instead radiculopathy pain is caused by a shoulder problem.   RLS: She has weaned off ropinirole .  Review of Systems  Constitutional:  Negative for activity change, appetite change and fever.  HENT:  Negative for mouth sores, sore throat and trouble swallowing.   Eyes:  Negative for redness and visual disturbance.  Respiratory:  Negative for cough, shortness of breath and wheezing.    Cardiovascular:  Negative for chest pain and leg swelling.  Gastrointestinal:  Negative for abdominal pain, nausea and vomiting.  Endocrine: Negative for cold intolerance, heat intolerance, polydipsia, polyphagia and polyuria.  Genitourinary:  Negative for decreased urine volume, dysuria and hematuria.  Musculoskeletal:  Positive for arthralgias and neck pain. Negative for gait problem.  Skin:  Negative for color change and rash.  Allergic/Immunologic: Negative for environmental allergies.  Neurological:  Negative for syncope, weakness and headaches.  Hematological:  Negative for adenopathy. Does not bruise/bleed easily.  Psychiatric/Behavioral:  Positive for sleep disturbance. Negative for confusion.   All other systems reviewed and are negative.  Current Outpatient Medications on File Prior to Visit  Medication Sig Dispense Refill   amLODipine  (NORVASC ) 10 MG tablet Take 1 tablet (10 mg total) by mouth daily. 90 tablet 2   celecoxib  (CELEBREX ) 100 MG capsule Take 1 capsule (100 mg total) by mouth 2 (two) times daily for 10 days. 20 capsule 0   chlorthalidone  (HYGROTON ) 25 MG tablet Take 1 tablet (25 mg total) by mouth daily. 90 tablet 0   megestrol  (MEGACE ) 40 MG tablet Take 1 tablet (40 mg total) by mouth 2 (two) times daily. Can increase to two tablets twice a day in the event of heavy bleeding (Patient taking differently: Take 40 mg by mouth as needed. Can increase to two tablets twice a day in the event of heavy bleeding) 60 tablet 5   ondansetron  (ZOFRAN ) 4 MG tablet Take 1 tablet (4 mg total) by mouth every 4 (four) hours as needed for nausea or vomiting. 60 tablet 1   OVER THE COUNTER MEDICATION Take 1 tablet by mouth daily as needed. Sleep aid     pantoprazole  (PROTONIX ) 40 MG tablet Take 1 tablet (40 mg total) by mouth 2 (two) times daily. 180 tablet 3   rosuvastatin  (CRESTOR ) 10 MG tablet TAKE 1 TABLET BY MOUTH EVERY DAY 90 tablet 0   spironolactone  (ALDACTONE ) 25 MG tablet Take  1 tablet (25 mg total) by mouth daily. 30 tablet 11   SYNTHROID  125 MCG tablet TAKE 1.5 TABS MONDAY, WEDNESDAY,AND FRIDAYS. CONTINUE 1 TAB THE REST OF THE DAYS. 90 tablet 1   valsartan  (DIOVAN ) 320 MG tablet Take 1 tablet (320 mg total) by mouth daily. 90 tablet 2   No current facility-administered medications on file prior to visit.   Past Medical History:  Diagnosis Date   Anemia    Anxiety    postpartum   Depression    post partum   Gestational diabetes    diet controlled   Headache    major issue in the past 4 years   Hypercalcemia    Hyperlipidemia    Hypertension    not on high blood pressure meds anymore   Hypothyroidism    PE (pulmonary embolism) 2004   Postpartum care following vaginal delivery (11/15) 06/18/2014   Thyroid  disease    hypothyroidism   Vitamin D  deficiency     Past Surgical History:  Procedure Laterality Date   CHOLECYSTECTOMY N/A 06/12/2016  Procedure: LAPAROSCOPIC CHOLECYSTECTOMY;  Surgeon: Lynda Leos, MD;  Location: [facility] OR;  Service: General;  Laterality: N/A;   EYE SURGERY     lasik   WISDOM TOOTH EXTRACTION     Allergies  Allergen Reactions   Ramipril Cough   Family History  Problem Relation Age of Onset   Cancer Mother        uterine and ovarian   Hypertension Mother    Varicose Veins Mother    Thyroid  disease Mother    Hyperlipidemia Mother    Mental illness Mother    Tuberculosis Sister    Heart disease Maternal Grandmother    Varicose Veins Maternal Grandfather    Mental illness Maternal Grandfather    Cancer Paternal Grandmother    Bipolar disorder Paternal Grandfather    Colon cancer Neg Hx    Liver disease Neg Hx    Esophageal cancer Neg Hx     Social History   Socioeconomic History   Marital status: Married    Spouse name: Selinda   Number of children: 2   Years of education: 18   Highest education level: Master's degree (e.g., MA, MS, MEng, MEd, MSW, MBA)  Occupational History   Occupation: Mudlogger: [employer]   Occupation: unempolyed  Tobacco Use   Smoking status: Every Day    Current packs/day: 0.00    Types: Cigarettes    Last attempt to quit: 02/25/2008    Years since quitting: 16.2   Smokeless tobacco: Never   Tobacco comments:    Has stopped and started smoking for years. Some days no smoking, other days chain smoking  Vaping Use   Vaping status: Never Used  Substance and Sexual Activity   Alcohol use: Yes    Comment: social   Drug use: No   Sexual activity: Not Currently    Birth control/protection: Condom  Other Topics Concern   Not on file  Social History Narrative   Patient is right-handed. She lives with her husband and two children in a one level home. She drinks multiple cups of caffeine daily (sources: coffee, soda, monster energy drink, tea). She walks her dog daily.   Social Drivers of Corporate Investment Banker Strain: Low Risk  (06/01/2023)   Overall Financial Resource Strain (CARDIA)    Difficulty of Paying Living Expenses: Not hard at all  Food Insecurity: No Food Insecurity (06/01/2023)   Hunger Vital Sign    Worried About Running Out of Food in the Last Year: Never true    Ran Out of Food in the Last Year: Never true  Transportation Needs: No Transportation Needs (06/01/2023)   PRAPARE - Administrator, Civil Service (Medical): No    Lack of Transportation (Non-Medical): No  Physical Activity: Unknown (06/01/2023)   Exercise Vital Sign    Days of Exercise per Week: Patient declined    Minutes of Exercise per Session: Not on file  Stress: Stress Concern Present (06/01/2023)   [employer] of Occupational Health - Occupational Stress Questionnaire    Feeling of Stress : Rather much  Social Connections: Unknown (06/01/2023)   Social Connection and Isolation Panel    Frequency of Communication with Friends and Family: Twice a week    Frequency of Social Gatherings with Friends and Family: Patient declined    Attends  Religious Services: Patient declined    Database Administrator or Organizations: No    Attends Banker Meetings: Not on file  Marital Status: Married   Vitals:   05/31/24 1317  BP: 102/70  Pulse: 87  Resp: 16  Temp: 98.8 F (37.1 C)  SpO2: 97%   Body mass index is 32.44 kg/m.  Wt Readings from Last 3 Encounters:  05/31/24 201 lb (91.2 kg)  05/31/24 202 lb 14.4 oz (92 kg)  05/24/24 203 lb 6.4 oz (92.3 kg)   Physical Exam Vitals and nursing note reviewed.  Constitutional:      General: She is not in acute distress.    Appearance: She is well-developed.  HENT:     Head: Normocephalic and atraumatic.     Right Ear: Hearing, tympanic membrane, ear canal and external ear normal.     Left Ear: Hearing, tympanic membrane, ear canal and external ear normal.     Mouth/Throat:     Mouth: Mucous membranes are moist.     Pharynx: Oropharynx is clear. Uvula midline.  Eyes:     Extraocular Movements: Extraocular movements intact.     Conjunctiva/sclera: Conjunctivae normal.     Pupils: Pupils are equal, round, and reactive to light.  Neck:     Thyroid : No thyromegaly.     Trachea: No tracheal deviation.  Cardiovascular:     Rate and Rhythm: Normal rate and regular rhythm.     Pulses:          Radial pulses are 2+ on the left side.       Dorsalis pedis pulses are 2+ on the right side and 2+ on the left side.     Heart sounds: No murmur heard. Pulmonary:     Effort: Pulmonary effort is normal. No respiratory distress.     Breath sounds: Normal breath sounds.  Abdominal:     Palpations: Abdomen is soft. There is no hepatomegaly or mass.     Tenderness: There is no abdominal tenderness.  Genitourinary:    Comments: Deferred to gyn. Musculoskeletal:     Left shoulder: Tenderness present. Normal pulse.     Comments: No major deformity or signs of synovitis appreciated.  Lymphadenopathy:     Cervical: No cervical adenopathy.     Upper Body:     Right upper body:  No supraclavicular adenopathy.     Left upper body: No supraclavicular adenopathy.  Skin:    General: Skin is warm.     Findings: No erythema or rash.  Neurological:     Mental Status: She is alert and oriented to person, place, and time.     Cranial Nerves: No cranial nerve deficit.     Gait: Gait normal.     Deep Tendon Reflexes:     Reflex Scores:      Bicep reflexes are 2+ on the right side and 2+ on the left side.      Patellar reflexes are 2+ on the right side and 2+ on the left side.    Comments: Decreased sensation left forearm and straighten 4/5 LUE. Rest with no focal deficit.  Psychiatric:        Mood and Affect: Mood and affect normal.   ASSESSMENT AND PLAN:  Ms. Jasmeet Manton was here today annual physical examination.  Orders Placed This Encounter  Procedures   Tdap vaccine greater than or equal to 7yo IM   CBC   TSH   Vitamin B12   Lipid panel   Comprehensive metabolic panel with GFR   AMB referral to orthopedics   Lab Results  Component Value Date   CHOL  178 05/31/2024   HDL 56.20 05/31/2024   LDLCALC 90 05/31/2024   TRIG 160.0 (H) 05/31/2024   CHOLHDL 3 05/31/2024   Lab Results  Component Value Date   TSH 0.10 (L) 05/31/2024   Lab Results  Component Value Date   WBC 8.7 05/31/2024   HGB 13.9 05/31/2024   HCT 41.4 05/31/2024   MCV 92.7 05/31/2024   PLT 215.0 05/31/2024   Lab Results  Component Value Date   VITAMINB12 247 05/31/2024   Lab Results  Component Value Date   NA 139 05/31/2024   CL 108 05/31/2024   K 4.3 05/31/2024   CO2 26 05/31/2024   BUN 13 05/31/2024   CREATININE 0.98 05/31/2024   GFR 69.80 05/31/2024   CALCIUM  10.4 05/31/2024   ALBUMIN 4.4 05/31/2024   GLUCOSE 98 05/31/2024   Lab Results  Component Value Date   ALT 23 05/31/2024   AST 17 05/31/2024   ALKPHOS 52 05/31/2024   BILITOT 0.3 05/31/2024   Routine general medical examination at a health care facility Assessment & Plan: We discussed the importance  of regular physical activity and healthy diet for prevention of chronic illness and/or complications. Preventive guidelines reviewed. Vaccination updated, Tdap given today. Follows with gynecologist for her female preventive care. Will discuss colon cancer screening with her GI. Next CPE in a year.  Hypothyroidism, unspecified type Assessment & Plan: Problem has been well controlled. Continue Levothyroxine  125 mcg daily. Further recommendations according to lab result.  Hyperlipidemia, unspecified hyperlipidemia type Assessment & Plan: Continue Rosuvastatin  10 mg daily and low fat diet. Further recommendations according to lipid panel result.  Orders: -     Lipid panel; Future -     Comprehensive metabolic panel with GFR; Future  Left upper extremity numbness She did not tolerate Amitriptyline  well. Radicular pain is still in the differential, prefers to hold on neurosurgeon consultation. Cervical MRI with some DDD. For now we will hold on pharmacologic treatment and neuro evaluation. Further recommendations according to lab results.  -     CBC; Future -     TSH; Future -     Vitamin B12; Future -     Ambulatory referral to Orthopedics  Chronic left shoulder pain Concerned about serious process. Will hold on imaging for now, ortho referral placed.  -     Ambulatory referral to Orthopedics -     Cyclobenzaprine  HCl; Take 0.5-1 tablets (5-10 mg total) by mouth at bedtime.  Dispense: 30 tablet; Refill: 1  Upper back pain on left side Would like to try a muscle relaxant, Flexeril  recommended at bedtime x 14 days then prn. Side effects discussed. Ortho referral placed.  -     Cyclobenzaprine  HCl; Take 0.5-1 tablets (5-10 mg total) by mouth at bedtime.  Dispense: 30 tablet; Refill: 1  Need for tetanus booster -     Tdap vaccine greater than or equal to 7yo IM  Screening for endocrine, metabolic and immunity disorder -     Comprehensive metabolic panel with GFR;  Future  Return in 1 year (on 05/31/2025) for CPE.  Kolleen Ochsner G. Calandra Madura, MD  [facility]. [place] office. "

## 2024-05-31 NOTE — Progress Notes (Signed)
 Cardiology Office Note:  .   Date:  05/31/2024  ID:  Jennifer Mcmahon, DOB [birth year], MRN [id number] PCP: Jordan, Betty G, MD  [facility] Providers Cardiologist:  Shelda Bruckner, MD {  History of Present Illness: .   Jennifer Mcmahon is a 43 y.o. female with a history of tobacco abuse, nonobstructive CAD, hypertension, metabolic syndrome, and hyperlipidemia here for follow-up.  She previously saw Dr. Hobart and established care with me on 07/16/23.    CV history: She saw her OB/GYN Dr. Rutherford and blood pressures were elevated to the 200s.  She was started on amlodipine  and HCTZ.  She initially developed hypertension during pregnancy and it improved postpartum but recurred.  She reported a strong family history of hypertension.  At her visit with Dr. Hobart her blood pressure was 148/102.  Amlodipine  was increased and HCTZ was switched to chlorthalidone .  She was also started on valsartan .  She was referred for sleep study which has not been performed.  She follow-up with our pharmacist and was encouraged to switch from ibuprofen  to acetaminophen  and to reduce her caffeine intake.  Spironolactone  was added and valsartan  was increased.  With those changes blood pressure was 122/78 on average when she was last seen 04/2023.  She is also working to cut back on her smoking intake.  Had coronary CT 07/29/23 for chest discomfort, which showed Ca score 9, cadrads 1 (1-24% stenosis in pLAD and pLCx). Recommended to start rosuvastatin  10 mg daily.   Today: No cardiac concerns today. Blood pressure has been well controlled. Last episode of labile blood pressure was 4-5 mos ago, no clear pattern to them except that they occur in the evening and are associated with nausea, feels poorly when they happen. Has checked her sugar during events and it is normal.  Palpitations have been minimal recently, more related to blood pressure lability several months ago.  Has lost weight over the  last year. Doesn't have a home scale, but has lost about 20 lbs on office scales. Hasn't been due to lifestyle change. Reviewed guidelines but she admits that she isn't focused on diet/lifestyle change.  Having left shoulder issues, has appt later today to review workup.   Tolerating rosuvastatin . Hasn't had lipid recheck since starting statin. Planning to see PCP for her annual physical later today, anticipates bloodwork.  ROS: Denies chest pain, shortness of breath at rest or with normal exertion. No PND, orthopnea, LE edema or unexpected weight gain. No syncope. ROS otherwise negative except as noted.   Studies Reviewed: SABRA    EKG:  EKG Interpretation Date/Time:  Wednesday May 31 2024 08:21:30 EDT Ventricular Rate:  73 PR Interval:  156 QRS Duration:  72 QT Interval:  362 QTC Calculation: 398 R Axis:   26  Text Interpretation: Normal sinus rhythm Normal ECG Confirmed by Bruckner Shelda [phone]) on 05/31/2024 8:46:25 AM    Physical Exam:   VS:  BP 100/72   Pulse 75   Ht 5' 6 (1.676 m)   Wt 202 lb 14.4 oz (92 kg)   SpO2 99%   BMI 32.75 kg/m    Wt Readings from Last 3 Encounters:  05/31/24 202 lb 14.4 oz (92 kg)  05/24/24 203 lb 6.4 oz (92.3 kg)  08/25/23 220 lb (99.8 kg)    GEN: Well nourished, well developed in no acute distress HEENT: Normal, moist mucous membranes NECK: No JVD CARDIAC: regular rhythm, normal S1 and S2, no rubs or gallops. No murmur. VASCULAR: Radial  and DP pulses 2+ bilaterally. No carotid bruits RESPIRATORY:  Clear to auscultation without rales, wheezing or rhonchi  ABDOMEN: Soft, non-tender, non-distended MUSCULOSKELETAL:  Ambulates independently SKIN: Warm and dry, no edema NEUROLOGIC:  Alert and oriented x 3. No focal neuro deficits noted. PSYCHIATRIC:  Normal affect    ASSESSMENT AND PLAN: .    Nonobstructive CAD Mixed hyperlipidemia With additional risk factors of: History of gestational diabetes Obesity, BMI 32 Family history  of heart disease Metabolic syndrome -recommended to start rosuvastatin  after CT scan results, tolerating this -LDL goal <70, TG goal <150 UPDATED TO ADD: LDL is 90, TG is 160, improved from prior but not at goal. Recommend increasing rosuvastatin  to 20 mg daily, recheck lipids in 3 mos, will send mychart message -has lost about 20 lbs, not completely intentional but has had some life changes. Encouraged lifestyle changes as below  Hypertension, historically resistant -well controlled today -currently on amlodipine , chlorthalidone , spironolactone , valsartan , continue -with weight loss, if she develops more frequent lightheadedness and BP is low, would cut back on medication.  Tobacco abuse -currently 1/2-1 ppd, precontemplative, discussed recommendation to quit  CV risk counseling and prevention -recommend heart healthy/Mediterranean diet, with whole grains, fruits, vegetable, fish, lean meats, nuts, and olive oil. Limit salt. -recommend moderate walking, 3-5 times/week for 30-50 minutes each session. Aim for at least 150 minutes/week. Goal should be pace of 3 miles/hours, or walking 1.5 miles in 30 minutes -recommend avoidance of tobacco products. Avoid excess alcohol.  Dispo: 1 year or sooner as needed  Signed, Shelda Bruckner, MD   Shelda Bruckner, MD, PhD, [facility], [facility]  [facility]    Heart & Vascular at [facility] at [facility] [address], [address] [place], [place] [address] [phone]

## 2024-05-31 NOTE — Patient Instructions (Signed)

## 2024-06-01 ENCOUNTER — Ambulatory Visit: Payer: Self-pay | Admitting: Family Medicine

## 2024-06-01 ENCOUNTER — Encounter: Payer: Self-pay | Admitting: Family Medicine

## 2024-06-01 DIAGNOSIS — E039 Hypothyroidism, unspecified: Secondary | ICD-10-CM

## 2024-06-01 DIAGNOSIS — E538 Deficiency of other specified B group vitamins: Secondary | ICD-10-CM

## 2024-06-01 NOTE — Assessment & Plan Note (Signed)
 Continue Rosuvastatin 10 mg daily and low fat diet. Further recommendations according to lipid panel result.

## 2024-06-01 NOTE — Assessment & Plan Note (Signed)
 Problem has been well controlled. Continue Levothyroxine  125 mcg daily. Further recommendations according to lab result.

## 2024-06-01 NOTE — Assessment & Plan Note (Signed)
 We discussed the importance of regular physical activity and healthy diet for prevention of chronic illness and/or complications. Preventive guidelines reviewed. Vaccination updated, Tdap given today. Follows with gynecologist for her female preventive care. Will discuss colon cancer screening with her GI. Next CPE in a year.

## 2024-06-02 MED ORDER — CYANOCOBALAMIN 1000 MCG/ML IJ SOLN: INTRAMUSCULAR | 0 refills | Status: AC

## 2024-06-02 MED ORDER — ROSUVASTATIN CALCIUM 10 MG PO TABS: 10.0000 mg | ORAL_TABLET | Freq: Every day | ORAL | 0 refills | Status: AC

## 2024-06-07 ENCOUNTER — Ambulatory Visit: Admitting: Orthopaedic Surgery

## 2024-06-07 ENCOUNTER — Other Ambulatory Visit (INDEPENDENT_AMBULATORY_CARE_PROVIDER_SITE_OTHER): Payer: Self-pay

## 2024-06-07 DIAGNOSIS — G8929 Other chronic pain: Secondary | ICD-10-CM

## 2024-06-07 DIAGNOSIS — M25512 Pain in left shoulder: Secondary | ICD-10-CM | POA: Diagnosis not present

## 2024-06-07 MED ORDER — LIDOCAINE HCL 1 % IJ SOLN
3.0000 mL | INTRAMUSCULAR | Status: AC | PRN
  Administered 2024-06-07: 3 mL

## 2024-06-07 MED ORDER — METHYLPREDNISOLONE ACETATE 40 MG/ML IJ SUSP
40.0000 mg | INTRAMUSCULAR | Status: AC | PRN
  Administered 2024-06-07: 40 mg via INTRA_ARTICULAR

## 2024-06-07 NOTE — Progress Notes (Signed)
 The patient is a 43 year old dealing with left shoulder pain for about a year now with no known injury.  She does get numbness in her fingers as well.  She did let me know she has had nerve conduction studies over 5 years ago and we saw the studies were negative for cervical pathology and even any type of compression down to the wrist or elbow.  However she has recently had a cervical spine MRI.  I saw the results of the cervical spine MRI and it did show some mild foraminal stenosis to the left side at the lower cervical spine.  She does have pain with overhead activities but reports no decrease in range of motion of her left shoulder.  On examination her left shoulder moves smoothly and fluidly but there is definitely pain past 90 degrees of abduction and forward flexion as well as reaching behind her.  The rotator cuff itself appears strong.  X-rays of the left shoulder show normal-appearing glenohumeral joint and no acute findings.  The [facility] joint is also well-maintained.  I did talk to her about trying a steroid injection in her left shoulder subacromial outlet.  She has not had this before.  She is right-hand dominant.  I think it is definitely worth trying this injection and then seeing her back in about 4 weeks to see how she is doing overall.  She may end up needing repeat nerve conduction studies at some point as well given the continued numbness and tingling in her 4th and 5th digits of her hand.  She did tolerate the steroid injection in her left shoulder well.  Will see her back in 4 weeks to see how she is doing overall.    Procedure Note  Patient: Jennifer Mcmahon             Date of Birth: [birth year]           MRN: [id number]             Visit Date: 06/07/2024  Procedures: Visit Diagnoses:  1. Chronic left shoulder pain     Large Joint Inj: L subacromial bursa on 06/07/2024 9:23 AM Indications: pain and diagnostic evaluation Details: 22 G 1.5 in needle  Arthrogram:  No  Medications: 3 mL lidocaine  1 %; 40 mg methylPREDNISolone acetate 40 MG/ML Outcome: tolerated well, no immediate complications Procedure, treatment alternatives, risks and benefits explained, specific risks discussed. Consent was given by the patient. Immediately prior to procedure a time out was called to verify the correct patient, procedure, equipment, support staff and site/side marked as required. Patient was prepped and draped in the usual sterile fashion.

## 2024-06-20 ENCOUNTER — Other Ambulatory Visit: Payer: Self-pay | Admitting: Family Medicine

## 2024-06-20 DIAGNOSIS — E039 Hypothyroidism, unspecified: Secondary | ICD-10-CM

## 2024-07-05 ENCOUNTER — Encounter: Payer: Self-pay | Admitting: Orthopaedic Surgery

## 2024-07-05 ENCOUNTER — Ambulatory Visit: Admitting: Orthopaedic Surgery

## 2024-07-05 DIAGNOSIS — R202 Paresthesia of skin: Secondary | ICD-10-CM | POA: Diagnosis not present

## 2024-07-05 DIAGNOSIS — R200 Anesthesia of skin: Secondary | ICD-10-CM

## 2024-07-05 NOTE — Progress Notes (Signed)
 The patient is a 43 year old female who comes in for follow-up as a relates to her left shoulder and a steroid injection replacing her shoulder a month ago.  She says the injection was very helpful about 3 weeks and started to hurt some again.  Her complaint over time is numbness and tingling in her 4th and 5th digits.  She says that has subsided somewhat but it still present.  She does a lot of activities as a relates to her arms.  She is right-hand-dominant.  She has remote nerve conduction studies that did not show any nerve compression over the cubital tunnel but her father has had cubital tunnel and carpal tunnel syndrome.  On exam her left shoulder function is entirely normal in terms of full range of motion and 5 out of 5 strength of the rotator cuff.  She does have a positive indican test.  There is some sensitivity over the cubital tunnel to palpation with a positive Tinel sign.  There is subjective numbness in the 4th and 5th digits.  I would like to send her to my partner Dr. Erwin who is a hand and upper extremity specialist to get his opinion of whether or not she has the potential of needing a release of the cubital tunnel and or a transposition of the nerve.  This may not be indicated but it is worth having a fellowship trained specialist take a look at this and she agrees as well.  Will work on getting an appoint with Dr. Erwin.

## 2024-07-10 ENCOUNTER — Other Ambulatory Visit: Payer: Self-pay | Admitting: Cardiology

## 2024-07-10 DIAGNOSIS — I1A0 Resistant hypertension: Secondary | ICD-10-CM

## 2024-07-10 DIAGNOSIS — R9431 Abnormal electrocardiogram [ECG] [EKG]: Secondary | ICD-10-CM

## 2024-07-10 DIAGNOSIS — I517 Cardiomegaly: Secondary | ICD-10-CM

## 2024-07-10 DIAGNOSIS — R0683 Snoring: Secondary | ICD-10-CM

## 2024-07-21 NOTE — Progress Notes (Unsigned)
 "  Jennifer Mcmahon - 43 y.o. female MRN [id number]  Date of birth: 04-11-1981  Office Visit Note: Visit Date: 07/24/2024 PCP: Jordan, Betty G, MD Referred by: Jordan, Betty G, MD  Subjective: No chief complaint on file.  HPI: Jennifer Mcmahon is a pleasant 43 y.o. female who presents today for evaluation of numbness and tingling of the ulnar-sided digits does been present now for multiple years.  She states that this is often quite related to elbow positioning, prickly in a bent elbow position however she has noticed that the numbness and tingling has become more persistent diffusely throughout the hand.  Does have ongoing nocturnal symptoms as well.  Underwent EMG testing in 2020 which did not show any significant abnormalities.  Feels the symptoms have worsened since that time.  She has been sent to me by my partner Dr. Vernetta for specific hand surgical evaluation.  Pertinent ROS were reviewed with the patient and found to be negative unless otherwise specified above in HPI.   Visit Reason: left cubital tunnel  Duration of symptoms: 5 years Hand dominance: right Occupation: computer work Diabetic: No Smoking: Yes Heart/Lung History: none Blood Thinners:  none  Prior Testing/EMG: xr Injections (Date): none Treatments: none Prior Surgery: none    Assessment & Plan: Visit Diagnoses:  1. Numbness and tingling in left hand     Plan: Extensive discussion was had with the patient today regarding her left upper extremity complaints.  She is exhibiting signs symptoms consistent with potential nerve compression.  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  I would like this to be further evaluated with EMG testing of the left upper extremity in order to better delineate site and specificity of these conditions.  Her Tinel's testing at the left elbow is minimal however at the wrist level there is concern for both the carpal tunnel and ulnar  tunnel.  Electrodiagnostic study will also help as compared to the previous electrodiagnostic study.  For the time being, we did discuss ongoing position and activity modification.  She expressed full understanding, return after the EMG is complete.  I spent 30 minutes in the care of this patient today including review of previous documentation, imaging obtained, face-to-face time discussing all options regarding treatment and documenting the encounter.   Follow-up: No follow-ups on file.   Meds & Orders: No orders of the defined types were placed in this encounter.   Orders Placed This Encounter  Procedures   XR Elbow Complete Left (3+View)   Ambulatory referral to Physical Medicine Rehab     Procedures: No procedures performed      Clinical History: No specialty comments available.  She reports that she has been smoking cigarettes. She has never used smokeless tobacco. No results for input(s): HGBA1C, LABURIC in the last 8760 hours.  Objective:   Vital Signs: There were no vitals taken for this visit.  Physical Exam  Gen: Well-appearing, in no acute distress; non-toxic CV: Regular Rate. Well-perfused. Warm.  Resp: Breathing unlabored on room air; no wheezing. Psych: Fluid speech in conversation; appropriate affect; normal thought process  Ortho Exam PHYSICAL EXAM:  General: Patient is well appearing and in no distress. Cervical spine mobility is full in all directions:  Skin and Muscle: No significant skin changes are apparent to upper extremities.   Range of Motion and Palpation Tests: Mobility is full about the elbows with flexion and extension.  No evidence of nerve subluxation at left elbow.  Forearm supination and pronation are 85/85 bilaterally.  Wrist flexion/extension is 75/65 bilaterally.  Digital flexion and extension are full.  Thumb opposition is full to the base of the small fingers bilaterally.    No cords or nodules are palpated.  No triggering is  observed.     Neurologic, Vascular, Motor: Sensation is diminished to light touch in the left median and ulnar distribution.    Tinel sign cubital tunnel: Negative left Scratch collapse elbow: Negative left  Tinel sign carpal tunnel: Mildly positive left Tinel's sign Guyon's canal: Positive left  Motor left side Thumb opposition is well-preserved, thumb abduction 5/5, no thenar wasting SF FDP: 3+/5 Froment: Mildly positive left Wartenberg: Negative left FDI wasting: Negative left  Fingers pink and well perfused.  Capillary refill is brisk.     Lab Results  Component Value Date   HGBA1C 5.2 06/24/2021      Imaging: No results found.  Past Medical/Family/Surgical/Social History: Medications & Allergies reviewed per EMR, new medications updated. Patient Active Problem List   Diagnosis Date Noted   Dysphagia 09/02/2022   Gastroesophageal reflux disease 09/02/2022   Routine general medical examination at a health care facility 06/29/2022   RLS (restless legs syndrome) 06/29/2022   Tremor 06/24/2021   Bipolar disorder (manic depression) (HCC) 05/02/2018   Hx of pulmonary embolus-2005 while on OCP 04/23/2018   Headache, unspecified headache type 07/19/2017   Allergic rhinitis 01/01/2017   Anxiety disorder 04/28/2011   Primary hypertension 04/17/2011   Hyperlipidemia 04/17/2011   Hypothyroidism 04/17/2011   Past Medical History:  Diagnosis Date   Anemia    Anxiety    postpartum   Depression    post partum   Gestational diabetes    diet controlled   Headache    major issue in the past 4 years   Hypercalcemia    Hyperlipidemia    Hypertension    not on high blood pressure meds anymore   Hypothyroidism    PE (pulmonary embolism) 2004   Postpartum care following vaginal delivery (11/15) 06/18/2014   Thyroid  disease    hypothyroidism   Vitamin D  deficiency    Family History  Problem Relation Age of Onset   Cancer Mother        uterine and ovarian    Hypertension Mother    Varicose Veins Mother    Thyroid  disease Mother    Hyperlipidemia Mother    Mental illness Mother    Tuberculosis Sister    Heart disease Maternal Grandmother    Varicose Veins Maternal Grandfather    Mental illness Maternal Grandfather    Cancer Paternal Grandmother    Bipolar disorder Paternal Grandfather    Colon cancer Neg Hx    Liver disease Neg Hx    Esophageal cancer Neg Hx    Past Surgical History:  Procedure Laterality Date   CHOLECYSTECTOMY N/A 06/12/2016   Procedure: LAPAROSCOPIC CHOLECYSTECTOMY;  Surgeon: Lynda Leos, MD;  Location: [facility] OR;  Service: General;  Laterality: N/A;   EYE SURGERY     lasik   WISDOM TOOTH EXTRACTION     Social History   Occupational History   Occupation: Careers Adviser: [employer]   Occupation: unempolyed  Tobacco Use   Smoking status: Every Day    Current packs/day: 0.00    Types: Cigarettes    Last attempt to quit: 02/25/2008    Years since quitting: 16.4   Smokeless tobacco: Never   Tobacco comments:    Has stopped and  started smoking for years. Some days no smoking, other days chain smoking  Vaping Use   Vaping status: Never Used  Substance and Sexual Activity   Alcohol use: Yes    Comment: social   Drug use: No   Sexual activity: Not Currently    Birth control/protection: Condom    Kaedyn Belardo Estela) Arlinda, M.D. [facility], Hand Surgery  "

## 2024-07-24 ENCOUNTER — Other Ambulatory Visit: Payer: Self-pay

## 2024-07-24 ENCOUNTER — Ambulatory Visit: Admitting: Orthopedic Surgery

## 2024-07-24 DIAGNOSIS — R200 Anesthesia of skin: Secondary | ICD-10-CM | POA: Diagnosis not present

## 2024-07-24 DIAGNOSIS — R202 Paresthesia of skin: Secondary | ICD-10-CM | POA: Diagnosis not present

## 2024-07-30 ENCOUNTER — Other Ambulatory Visit: Payer: Self-pay | Admitting: Family Medicine

## 2024-07-30 DIAGNOSIS — G8929 Other chronic pain: Secondary | ICD-10-CM

## 2024-07-30 DIAGNOSIS — M549 Dorsalgia, unspecified: Secondary | ICD-10-CM

## 2024-08-15 ENCOUNTER — Encounter: Admitting: Physical Medicine and Rehabilitation

## 2024-09-08 ENCOUNTER — Ambulatory Visit: Admitting: Physical Medicine and Rehabilitation

## 2024-09-08 DIAGNOSIS — R202 Paresthesia of skin: Secondary | ICD-10-CM

## 2024-09-08 NOTE — Progress Notes (Unsigned)
 Pain Scale   Average Pain 4 Patient advising she has left shoulder and arm pain causing numbness, tingling and some weakness. Patient is Right Hand Dominate        +Driver, -BT, -Dye Allergies.

## 2024-09-08 NOTE — Progress Notes (Unsigned)
 "  Jennifer Mcmahon - 44 y.o. female MRN [id number]  Date of birth: [birth year]  Office Visit Note: Visit Date: 09/08/2024 PCP: Jordan, Betty G, MD Referred by: Arlinda Buster, MD  Subjective: Chief Complaint  Patient presents with   Left Shoulder - Pain   Left Hand - Pain, Numbness, Weakness   HPI: Jennifer Mcmahon is a 44 y.o. female who comes in today at the request of Dr. Anshul Agarwala for evaluation and management of chronic, worsening and severe pain, numbness and tingling in the Left upper extremities.  Patient is Right hand dominant.   numbness and tingling of the ulnar-sided digits does been present now for multiple years.  She states that this is often quite related to elbow positioning, prickly in a bent elbow position however she has noticed that the numbness and tingling has become more persistent diffusely throughout the hand.  Does have ongoing nocturnal symptoms as well.  Underwent EMG testing in 2020 which did not show any significant abnormalities.  Feels the symptoms have worsened since that time.      ROS Otherwise per HPI.  Assessment & Plan: Visit Diagnoses:    ICD-10-CM   1. Paresthesia of skin  R20.2 NCV with EMG (electromyography)       Plan: No additional findings.   Meds & Orders: No orders of the defined types were placed in this encounter.   Orders Placed This Encounter  Procedures   NCV with EMG (electromyography)    Follow-up: No follow-ups on file.   Procedures: No procedures performed      Clinical History: 03/13/2019 NCV & EMG Findings: Extensive electrodiagnostic testing of the left upper extremity shows:  1. Left median, ulnar, and mixed palmar sensory responses are within normal limits. 2. Left median and ulnar motor responses are within normal limits. 3. There is no evidence of active or chronic motor axonal loss changes affecting any of the tested muscles.  Motor unit configuration and recruitment pattern is within normal  limits.   Impression: This is a normal study of the left upper extremity.  In particular, there is no evidence of carpal tunnel syndrome or cervical radiculopathy.     ___________________________ Tonita Blanch, DO   She reports that she has been smoking cigarettes. She has never used smokeless tobacco. No results for input(s): HGBA1C, LABURIC in the last 8760 hours.  Objective:  VS:  HT:    WT:   BMI:     BP:   HR: bpm  TEMP: ( )  RESP:  Physical Exam  Ortho Exam  Imaging: No results found.  Past Medical/Family/Surgical/Social History: Medications & Allergies reviewed per EMR, new medications updated. Patient Active Problem List   Diagnosis Date Noted   Dysphagia 09/02/2022   Gastroesophageal reflux disease 09/02/2022   Routine general medical examination at a health care facility 06/29/2022   RLS (restless legs syndrome) 06/29/2022   Tremor 06/24/2021   Bipolar disorder (manic depression) (HCC) 05/02/2018   Hx of pulmonary embolus-2005 while on OCP 04/23/2018   Headache, unspecified headache type 07/19/2017   Allergic rhinitis 01/01/2017   Anxiety disorder 04/28/2011   Primary hypertension 04/17/2011   Hyperlipidemia 04/17/2011   Hypothyroidism 04/17/2011   Past Medical History:  Diagnosis Date   Anemia    Anxiety    postpartum   Depression    post partum   Gestational diabetes    diet controlled   Headache    major issue in the past 4 years   Hypercalcemia  Hyperlipidemia    Hypertension    not on high blood pressure meds anymore   Hypothyroidism    PE (pulmonary embolism) 2004   Postpartum care following vaginal delivery (11/15) 06/18/2014   Thyroid  disease    hypothyroidism   Vitamin D  deficiency    Family History  Problem Relation Age of Onset   Cancer Mother        uterine and ovarian   Hypertension Mother    Varicose Veins Mother    Thyroid  disease Mother    Hyperlipidemia Mother    Mental illness Mother    Tuberculosis Sister     Heart disease Maternal Grandmother    Varicose Veins Maternal Grandfather    Mental illness Maternal Grandfather    Cancer Paternal Grandmother    Bipolar disorder Paternal Grandfather    Colon cancer Neg Hx    Liver disease Neg Hx    Esophageal cancer Neg Hx    Past Surgical History:  Procedure Laterality Date   CHOLECYSTECTOMY N/A 06/12/2016   Procedure: LAPAROSCOPIC CHOLECYSTECTOMY;  Surgeon: Lynda Leos, MD;  Location: [facility] OR;  Service: General;  Laterality: N/A;   EYE SURGERY     lasik   WISDOM TOOTH EXTRACTION     Social History   Occupational History   Occupation: Careers Adviser: [employer]   Occupation: unempolyed  Tobacco Use   Smoking status: Every Day    Current packs/day: 0.00    Types: Cigarettes    Last attempt to quit: 02/25/2008    Years since quitting: 16.5   Smokeless tobacco: Never   Tobacco comments:    Has stopped and started smoking for years. Some days no smoking, other days chain smoking  Vaping Use   Vaping status: Never Used  Substance and Sexual Activity   Alcohol use: Yes    Comment: social   Drug use: No   Sexual activity: Not Currently    Birth control/protection: Condom    "

## 2024-09-18 ENCOUNTER — Ambulatory Visit: Admitting: Orthopedic Surgery
